# Patient Record
Sex: Female | Born: 1935 | Race: White | Hispanic: No | Smoking: Never smoker
Health system: Southern US, Community
[De-identification: ages and names within clinical notes are randomized; demographics above are authoritative.]

## PROBLEM LIST (undated history)

## (undated) DIAGNOSIS — K219 Gastro-esophageal reflux disease without esophagitis: Secondary | ICD-10-CM

## (undated) DIAGNOSIS — E785 Hyperlipidemia, unspecified: Secondary | ICD-10-CM

## (undated) DIAGNOSIS — M949 Disorder of cartilage, unspecified: Secondary | ICD-10-CM

## (undated) DIAGNOSIS — E039 Hypothyroidism, unspecified: Secondary | ICD-10-CM

## (undated) DIAGNOSIS — J449 Chronic obstructive pulmonary disease, unspecified: Secondary | ICD-10-CM

## (undated) DIAGNOSIS — I10 Essential (primary) hypertension: Secondary | ICD-10-CM

## (undated) DIAGNOSIS — F0390 Unspecified dementia without behavioral disturbance: Secondary | ICD-10-CM

## (undated) DIAGNOSIS — J4489 Other specified chronic obstructive pulmonary disease: Secondary | ICD-10-CM

## (undated) DIAGNOSIS — M899 Disorder of bone, unspecified: Secondary | ICD-10-CM

## (undated) HISTORY — DX: Disorder of bone, unspecified: M89.9

## (undated) HISTORY — DX: Chronic obstructive pulmonary disease, unspecified: J44.9

## (undated) HISTORY — PX: OTHER SURGICAL HISTORY: SHX169

## (undated) HISTORY — DX: Other specified chronic obstructive pulmonary disease: J44.89

## (undated) HISTORY — DX: Hyperlipidemia, unspecified: E78.5

## (undated) HISTORY — DX: Hypothyroidism, unspecified: E03.9

## (undated) HISTORY — DX: Disorder of cartilage, unspecified: M94.9

## (undated) HISTORY — DX: Gastro-esophageal reflux disease without esophagitis: K21.9

## (undated) HISTORY — PX: ABDOMINAL HYSTERECTOMY: SHX81

## (undated) HISTORY — DX: Essential (primary) hypertension: I10

---

## 1998-06-22 ENCOUNTER — Inpatient Hospital Stay (HOSPITAL_COMMUNITY): Admission: EM | Admit: 1998-06-22 | Discharge: 1998-06-25 | Payer: Self-pay | Admitting: Emergency Medicine

## 1998-06-22 ENCOUNTER — Encounter: Payer: Self-pay | Admitting: *Deleted

## 1998-06-24 ENCOUNTER — Encounter: Payer: Self-pay | Admitting: Internal Medicine

## 1999-01-30 ENCOUNTER — Other Ambulatory Visit: Admission: RE | Admit: 1999-01-30 | Discharge: 1999-01-30 | Payer: Self-pay | Admitting: Orthopedic Surgery

## 1999-08-06 ENCOUNTER — Encounter: Admission: RE | Admit: 1999-08-06 | Discharge: 1999-08-06 | Payer: Self-pay | Admitting: Internal Medicine

## 1999-08-06 ENCOUNTER — Encounter: Payer: Self-pay | Admitting: Internal Medicine

## 2000-04-20 ENCOUNTER — Emergency Department (HOSPITAL_COMMUNITY): Admission: EM | Admit: 2000-04-20 | Discharge: 2000-04-20 | Payer: Self-pay | Admitting: *Deleted

## 2000-08-11 ENCOUNTER — Encounter: Payer: Self-pay | Admitting: Internal Medicine

## 2000-08-11 ENCOUNTER — Encounter: Admission: RE | Admit: 2000-08-11 | Discharge: 2000-08-11 | Payer: Self-pay | Admitting: Internal Medicine

## 2001-09-02 ENCOUNTER — Encounter: Payer: Self-pay | Admitting: Internal Medicine

## 2001-09-02 ENCOUNTER — Encounter: Admission: RE | Admit: 2001-09-02 | Discharge: 2001-09-02 | Payer: Self-pay | Admitting: Internal Medicine

## 2002-09-08 ENCOUNTER — Encounter: Payer: Self-pay | Admitting: Internal Medicine

## 2002-09-08 ENCOUNTER — Encounter: Admission: RE | Admit: 2002-09-08 | Discharge: 2002-09-08 | Payer: Self-pay | Admitting: Internal Medicine

## 2003-09-29 ENCOUNTER — Ambulatory Visit (HOSPITAL_COMMUNITY): Admission: RE | Admit: 2003-09-29 | Discharge: 2003-09-29 | Payer: Self-pay | Admitting: Internal Medicine

## 2004-01-22 ENCOUNTER — Emergency Department (HOSPITAL_COMMUNITY): Admission: EM | Admit: 2004-01-22 | Discharge: 2004-01-22 | Payer: Self-pay | Admitting: Emergency Medicine

## 2004-02-27 ENCOUNTER — Emergency Department (HOSPITAL_COMMUNITY): Admission: EM | Admit: 2004-02-27 | Discharge: 2004-02-27 | Payer: Self-pay | Admitting: Emergency Medicine

## 2004-03-06 ENCOUNTER — Ambulatory Visit: Payer: Self-pay | Admitting: Internal Medicine

## 2005-04-22 ENCOUNTER — Ambulatory Visit: Payer: Self-pay | Admitting: Internal Medicine

## 2005-04-24 ENCOUNTER — Ambulatory Visit: Payer: Self-pay | Admitting: Internal Medicine

## 2005-05-01 ENCOUNTER — Ambulatory Visit: Payer: Self-pay

## 2005-08-06 ENCOUNTER — Ambulatory Visit: Payer: Self-pay | Admitting: Internal Medicine

## 2006-03-25 ENCOUNTER — Ambulatory Visit: Payer: Self-pay | Admitting: Internal Medicine

## 2006-09-18 ENCOUNTER — Other Ambulatory Visit: Payer: Self-pay | Admitting: Emergency Medicine

## 2006-09-18 ENCOUNTER — Emergency Department (HOSPITAL_COMMUNITY): Admission: EM | Admit: 2006-09-18 | Discharge: 2006-09-18 | Payer: Self-pay | Admitting: Emergency Medicine

## 2006-09-25 ENCOUNTER — Ambulatory Visit: Payer: Self-pay | Admitting: Internal Medicine

## 2006-10-25 ENCOUNTER — Encounter: Payer: Self-pay | Admitting: *Deleted

## 2006-10-25 DIAGNOSIS — M949 Disorder of cartilage, unspecified: Secondary | ICD-10-CM

## 2006-10-25 DIAGNOSIS — K219 Gastro-esophageal reflux disease without esophagitis: Secondary | ICD-10-CM

## 2006-10-25 DIAGNOSIS — I10 Essential (primary) hypertension: Secondary | ICD-10-CM

## 2006-10-25 DIAGNOSIS — M899 Disorder of bone, unspecified: Secondary | ICD-10-CM | POA: Insufficient documentation

## 2006-10-25 DIAGNOSIS — Z9849 Cataract extraction status, unspecified eye: Secondary | ICD-10-CM

## 2006-10-25 DIAGNOSIS — E039 Hypothyroidism, unspecified: Secondary | ICD-10-CM

## 2006-10-25 DIAGNOSIS — E785 Hyperlipidemia, unspecified: Secondary | ICD-10-CM | POA: Insufficient documentation

## 2006-10-25 DIAGNOSIS — J449 Chronic obstructive pulmonary disease, unspecified: Secondary | ICD-10-CM | POA: Insufficient documentation

## 2006-10-25 DIAGNOSIS — Z9079 Acquired absence of other genital organ(s): Secondary | ICD-10-CM | POA: Insufficient documentation

## 2006-11-06 ENCOUNTER — Inpatient Hospital Stay (HOSPITAL_COMMUNITY): Admission: EM | Admit: 2006-11-06 | Discharge: 2006-11-07 | Payer: Self-pay | Admitting: Emergency Medicine

## 2006-11-07 ENCOUNTER — Encounter: Payer: Self-pay | Admitting: Internal Medicine

## 2006-11-10 ENCOUNTER — Ambulatory Visit: Payer: Self-pay | Admitting: Internal Medicine

## 2006-11-27 ENCOUNTER — Encounter: Payer: Self-pay | Admitting: Internal Medicine

## 2007-02-04 ENCOUNTER — Encounter: Payer: Self-pay | Admitting: Internal Medicine

## 2007-02-04 ENCOUNTER — Encounter (INDEPENDENT_AMBULATORY_CARE_PROVIDER_SITE_OTHER): Payer: Self-pay | Admitting: General Surgery

## 2007-02-04 ENCOUNTER — Ambulatory Visit (HOSPITAL_COMMUNITY): Admission: RE | Admit: 2007-02-04 | Discharge: 2007-02-05 | Payer: Self-pay | Admitting: General Surgery

## 2007-02-19 ENCOUNTER — Encounter: Payer: Self-pay | Admitting: Internal Medicine

## 2008-01-04 ENCOUNTER — Telehealth: Payer: Self-pay | Admitting: Internal Medicine

## 2008-01-07 ENCOUNTER — Ambulatory Visit: Payer: Self-pay | Admitting: Internal Medicine

## 2008-01-07 LAB — CONVERTED CEMR LAB
BUN: 14 mg/dL (ref 6–23)
Basophils Relative: 0.3 % (ref 0.0–3.0)
Bilirubin Urine: NEGATIVE
Calcium: 9.2 mg/dL (ref 8.4–10.5)
Cholesterol: 241 mg/dL (ref 0–200)
Creatinine, Ser: 0.8 mg/dL (ref 0.4–1.2)
Direct LDL: 152 mg/dL
Eosinophils Absolute: 0.2 10*3/uL (ref 0.0–0.7)
GFR calc Af Amer: 87 mL/min
HCT: 36.8 % (ref 36.0–46.0)
Hemoglobin: 12.7 g/dL (ref 12.0–15.0)
MCHC: 34.6 g/dL (ref 30.0–36.0)
Nitrite: NEGATIVE
Specific Gravity, Urine: 1.01 (ref 1.000–1.03)
TSH: 2 microintl units/mL (ref 0.35–5.50)
Total CHOL/HDL Ratio: 4
VLDL: 24 mg/dL (ref 0–40)
pH: 7.5 (ref 5.0–8.0)

## 2008-04-20 ENCOUNTER — Telehealth: Payer: Self-pay | Admitting: Internal Medicine

## 2009-01-13 ENCOUNTER — Telehealth: Payer: Self-pay | Admitting: Internal Medicine

## 2009-03-06 ENCOUNTER — Ambulatory Visit: Payer: Self-pay | Admitting: Internal Medicine

## 2009-03-06 LAB — CONVERTED CEMR LAB
Alkaline Phosphatase: 80 units/L (ref 39–117)
BUN: 16 mg/dL (ref 6–23)
Basophils Absolute: 0.1 10*3/uL (ref 0.0–0.1)
Bilirubin, Direct: 0.2 mg/dL (ref 0.0–0.3)
Chloride: 105 meq/L (ref 96–112)
Creatinine, Ser: 0.9 mg/dL (ref 0.4–1.2)
Eosinophils Absolute: 0.1 10*3/uL (ref 0.0–0.7)
Glucose, Bld: 103 mg/dL — ABNORMAL HIGH (ref 70–99)
HCT: 37.5 % (ref 36.0–46.0)
Hemoglobin: 12.7 g/dL (ref 12.0–15.0)
Lymphs Abs: 1.4 10*3/uL (ref 0.7–4.0)
MCHC: 34 g/dL (ref 30.0–36.0)
MCV: 92.8 fL (ref 78.0–100.0)
Monocytes Absolute: 0.7 10*3/uL (ref 0.1–1.0)
Monocytes Relative: 9.2 % (ref 3.0–12.0)
Neutro Abs: 5.2 10*3/uL (ref 1.4–7.7)
Potassium: 4.1 meq/L (ref 3.5–5.1)
RDW: 13 % (ref 11.5–14.6)
Total Bilirubin: 0.5 mg/dL (ref 0.3–1.2)
Total CHOL/HDL Ratio: 3
VLDL: 19.8 mg/dL (ref 0.0–40.0)

## 2009-07-10 ENCOUNTER — Telehealth: Payer: Self-pay | Admitting: Internal Medicine

## 2010-03-01 NOTE — Progress Notes (Signed)
Summary: arm bruise  Phone Note Call from Patient   Caller: Daughter- Josephina Gip [phone] Summary of Call: Daughter called stating that pt has hurt her harm and " has a bad bruise w/ a scrap". Per daughter, [facility] nurse at the church advised that she should have her MD to take a look at it. Daughter is requesting a call back to explain and see if there is anything that can be done (wrapping etc) w/o coming into the office...Marland KitchenMarland KitchenAlvy Beal Archie CMA  July 10, 2009 2:00 PM   Follow-up for Phone Call        1) wash gently with soap and water 2) after drying well apply thin coat of triple antibiotic ointment 3) cover scrap with non-adherent pad and hold in place with either paper tape or kerlex.  4) call if it looks infected.  Called Ms. Horney with instructions Follow-up by: Jacques Navy MD,  July 10, 2009 6:46 PM

## 2010-03-01 NOTE — Assessment & Plan Note (Signed)
Summary: 2 mos f/u per flag/#/cd   Vital Signs:  Patient profile:   77 year old female Height:      62 inches Weight:      117 pounds O2 Sat:      98 % on Room air Temp:     98.2 degrees F oral Pulse rate:   81 / minute BP sitting:   142 / 70  (left arm) Cuff size:   regular  Vitals Entered By: Bill Salinas CMA (March 06, 2009 10:46 AM)  O2 Flow:  Room air CC: pt here for office visit for refills on medication/ pt is due for tetanus and has never had shingles vaccine/ ab   Primary Care Provider:  Jacques Navy MD  CC:  pt here for office visit for refills on medication/ pt is due for tetanus and has never had shingles vaccine/ ab.  History of Present Illness: Patient presents for routine medical follow-up. Last office visit December '09. In the interval - she has had no major illnesses. She did fall and tore the left rotator cuff - no surgical intervention. She did have PT and did well. She does do relatively well. She did fall out of  bed but no injury. She has had some mild confusion. Before Christmas she did develop cough - treated with otc remedies. She has otherwise been doing OK. She has very close family support.   Current Medications (verified): 1)  Synthroid 25 Mcg  Tabs (Levothyroxine Sodium) .... Take One Tablet Once Daily 2)  Enalapril Maleate 2.5 Mg  Tabs (Enalapril Maleate) .... Take One Tablet Once Daily 3)  Aspirin 81 Mg  Tabs (Aspirin) .... Take One Tablet Once Daily 4)  Advil 200 Mg Tabs (Ibuprofen) .... As Needed 5)  Benadryl 25 Mg Caps (Diphenhydramine Hcl) .... As Needed  Allergies (verified): No Known Drug Allergies  Past History:  Past Medical History: Last updated: 10/25/2006 HYPOTHYROIDISM (ICD-244.9) OSTEOPENIA (ICD-733.90) CHRONIC OBSTRUCTIVE PULMONARY DISEASE (ICD-496) HYPERTENSION (ICD-401.9) GASTROESOPHAGEAL REFLUX DISEASE (ICD-530.81) HYPERLIPIDEMIA (ICD-272.4)    Past Surgical History: Last updated: 10/25/2006 CATARACT  EXTRACTIONS, BILATERAL, HX OF (ICD-V45.61) TOTAL HYSTERECTOMY AND BILATERAL SALPINGOOPHERECTOMY, HX OF (ICD-V45.77)  Family History: Last updated: 01/07/2008 non-contributory in an octogenarian  Social History: HSG married 1944-62 yrs/widowed 1 daughter - '50; 1 son '49; 4 grandchildren 1 step-grandchild, 3 great grands 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  End of Life - patient, in the presence of her daughter, states she does not want CPR, mechanical ventilation or heroic/futile measures.  Review of Systems  The patient denies anorexia, fever, weight loss, weight gain, decreased hearing, hoarseness, chest pain, dyspnea on exertion, peripheral edema, prolonged cough, hemoptysis, abdominal pain, severe indigestion/heartburn, incontinence, suspicious skin lesions, difficulty walking, unusual weight change, and enlarged lymph nodes.    Physical Exam  General:  Elderly, wizened white female in no distress Head:  normocephalic and atraumatic.   Eyes:  vision grossly intact, pupils equal, pupils round, corneas and lenses clear, and no injection.   Ears:  R ear normal and L ear normal.   Nose:  no external deformity and no external erythema.   Neck:  supple, full ROM, no thyromegaly, and no carotid bruits.   Lungs:  normal respiratory effort, normal breath sounds, no crackles, and no wheezes.   Heart:  normal rate, regular rhythm, and no murmur.   Abdomen:  soft, non-tender, normal bowel sounds, no masses, no guarding, and no hepatomegaly.   Msk:  normal  ROM, no joint tenderness, no joint swelling, and no redness over joints.   Pulses:  2+ radial pulses Extremities:  no deformity. Neurologic:  alert & oriented X3, cranial nerves II-XII intact, strength normal in all extremities, and gait normal.   Skin:  turgor normal, color normal, no rashes, no petechiae, and no edema.   Cervical Nodes:  no anterior cervical adenopathy and no posterior cervical adenopathy.     Psych:  Oriented X3, memory intact for recent and remote, normally interactive, good eye contact, and not anxious appearing.     Impression & Recommendations:  Problem # 1:  HYPOTHYROIDISM (ICD-244.9) For lab today with recommendations to follow.   Her updated medication list for this problem includes:    Synthroid 25 Mcg Tabs (Levothyroxine sodium) .Marland Kitchen... Take one tablet once daily  Orders: TLB-TSH (Thyroid Stimulating Hormone) (84443-TSH)  addendum- TSH 2.01 - excellent. Wil lcontinue present medication  Problem # 2:  HYPERTENSION (ICD-401.9)  Her updated medication list for this problem includes:    Enalapril Maleate 2.5 Mg Tabs (Enalapril maleate) .Marland Kitchen... Take one tablet once daily  Orders: TLB-BMP (Basic Metabolic Panel-BMET) (80048-METABOL)  BP today: 142/70 Prior BP: 140/82 (01/07/2008)  Adequate control. Continue present medication.  Labs Reviewed: K+: 4.0 (01/07/2008) Creat: : 0.8 (01/07/2008)   Chol: 241 (01/07/2008)   HDL: 60.3 (01/07/2008)   LDL: DEL (01/07/2008)   TG: 120 (01/07/2008)  Problem # 3:  CHRONIC OBSTRUCTIVE PULMONARY DISEASE (ICD-496) No respiratory distress reported or obsereved. She is stable in this regard.   Problem # 4:  HYPERLIPIDEMIA (ICD-272.4) Due for lab with recommendations to follow.  Orders: TLB-Lipid Panel (80061-LIPID) TLB-Hepatic/Liver Function Pnl (80076-HEPATIC)  Addendum - LDL 164.7 above goal of 130 anbd treatment threshhold of 160. HOwever, given her age, relative absence of other cardiac risk factors will not start medical therapy.  Problem # 5:  Preventive Health Care (ICD-V70.0) Unreamkrable exam. Her lab results, except for mild elevation in cholesterol, are fine. She seems to be doing well. She is current with pneumovax.  In summary - a delightful woman who seems medically stable at this time.  Complete Medication List: 1)  Synthroid 25 Mcg Tabs (Levothyroxine sodium) .... Take one tablet once daily 2)  Enalapril  Maleate 2.5 Mg Tabs (Enalapril maleate) .... Take one tablet once daily 3)  Aspirin 81 Mg Tabs (Aspirin) .... Take one tablet once daily 4)  Advil 200 Mg Tabs (Ibuprofen) .... As needed 5)  Benadryl 25 Mg Caps (Diphenhydramine hcl) .... As needed  Other Orders: TLB-CBC Platelet - w/Differential (85025-CBCD) Prescription Created Electronically [phone])  Patient: [name] Note: All result statuses are Final unless otherwise noted.  Tests: (1) TSH (TSH)   FastTSH                   2.01 uIU/mL                 0.35-5.50  Tests: (2) BMP (METABOL)   Sodium                    141 mEq/L                   135-145   Potassium                 4.1 mEq/L                   3.5-5.1   Chloride  105 mEq/L                   96-112   Carbon Dioxide            31 mEq/L                    19-32   Glucose              [H]  103 mg/dL                   60-45   BUN                       16 mg/dL                    4-09   Creatinine                0.9 mg/dL                   8.1-1.9   Calcium                   9.4 mg/dL                   1.4-78.2   GFR                       62.83 mL/min                >60  Tests: (3) Lipid Panel (LIPID)   Cholesterol          [H]  241 mg/dL                   9-562     ATP III Classification            Desirable:  < 200 mg/dL                    Borderline High:  200 - 239 mg/dL               High:  > = 240 mg/dL   Triglycerides             99.0 mg/dL                  1.3-086.5     Normal:  <150 mg/dL     Borderline High:  784 - 199 mg/dL   HDL                       69.62 mg/dL                 >95.28   VLDL Cholesterol          19.8 mg/dL                  4.1-32.4  CHO/HDL Ratio:  CHD Risk                             3                    Men          Women     1/2 Average Risk     3.4          3.3  Average Risk          5.0          4.4     2X Average Risk          9.6          7.1     3X Average Risk          15.0          11.0                            Tests: (4) Hepatic/Liver Function Panel (HEPATIC)   Total Bilirubin           0.5 mg/dL                   0.4-5.4   Direct Bilirubin          0.2 mg/dL                   0.9-8.1   Alkaline Phosphatase      80 U/L                      39-117   AST                       19 U/L                      0-37   ALT                       12 U/L                      0-35   Total Protein             6.6 g/dL                    1.9-1.4   Albumin                   4.2 g/dL                    7.8-2.9  Tests: (5) CBC Platelet w/Diff (CBCD)   White Cell Count          7.5 K/uL                    4.5-10.5   Red Cell Count            4.04 Mil/uL                 3.87-5.11   Hemoglobin                12.7 g/dL                   56.2-13.0   Hematocrit                37.5 %                      36.0-46.0   MCV                       92.8 fl                     78.0-100.0   MCHC  34.0 g/dL                   16.1-09.6   RDW                       13.0 %                      11.5-14.6   Platelet Count            221.0 K/uL                  150.0-400.0   Neutrophil %              69.3 %                      43.0-77.0   Lymphocyte %              19.0 %                      12.0-46.0   Monocyte %                9.2 %                       3.0-12.0   Eosinophils%              1.0 %                       0.0-5.0   Basophils %               1.5 %                       0.0-3.0   Neutrophill Absolute      5.2 K/uL                    1.4-7.7   Lymphocyte Absolute       1.4 K/uL                    0.7-4.0   Monocyte Absolute         0.7 K/uL                    0.1-1.0  Eosinophils, Absolute                             0.1 K/uL                    0.0-0.7   Basophils Absolute        0.1 K/uL                    0.0-0.1  Tests: (6) Cholesterol LDL - Direct (DIRLDL)  Cholesterol LDL - Direct                             164.4 mg/dL     Optimal:  <045 mg/dL     Near or Above Optimal:  100-129  mg/dL     Borderline High:  409-811 mg/dL     High:  914-782 mg/dL     Very High:  >956 mg/dLPrescriptions: ENALAPRIL MALEATE 2.5 MG  TABS (ENALAPRIL MALEATE) Take one tablet once daily  #  30 x 12   Entered and Authorized by:   Jacques Navy MD   Signed by:   Jacques Navy MD on 03/06/2009   Method used:   Electronically to        [facility] [phone]* (retail)       [address]       [place], [place]  [address]       Ph: [phone]       Fax: [phone]   RxID:   [id number] SYNTHROID 25 MCG  TABS (LEVOTHYROXINE SODIUM) Take one tablet once daily  #30 x 12   Entered and Authorized by:   Jacques Navy MD   Signed by:   Jacques Navy MD on 03/06/2009   Method used:   Electronically to        [facility] [facility] [address] [phone]* (retail)       [address]       [place], [place]  [address]       Ph: [phone]       Fax: [phone]   RxID:   [id number]    Not Administered:    Influenza Vaccine not given due to: declined

## 2010-04-10 ENCOUNTER — Telehealth: Payer: Self-pay | Admitting: Internal Medicine

## 2010-04-17 NOTE — Progress Notes (Signed)
  Phone Note Refill Request      Prescriptions: ENALAPRIL MALEATE 2.5 MG  TABS (ENALAPRIL MALEATE) Take one tablet once daily  #30 x 6   Entered by:   Ami Bullins CMA   Authorized by:   Jacques Navy MD   Signed by:   Bill Salinas CMA on 04/10/2010   Method used:   Electronically to        [facility] [id number] ([facility]ail)       [address]       [place], [place]  [address]       Ph: [phone]       Fax: [phone]   RxID:   [id number] SYNTHROID 25 MCG  TABS (LEVOTHYROXINE SODIUM) Take one tablet once daily  #30 x 6   Entered by:   Ami Bullins CMA   Authorized by:   Jacques Navy MD   Signed by:   Bill Salinas CMA on 04/10/2010   Method used:   [facility]ronically to        [facility] [address] [id number] ([facility]       [address]       [place], [place]  [address]       Ph: [phone]       Fax: [phone]   RxID:   [id number]

## 2010-06-12 NOTE — Discharge Summary (Signed)
[name], [name]           ACCOUNT NO.:  [id number]   MEDICAL RECORD NO.:  [id number]          PATIENT TYPE:  INP   LOCATION:  4714                         FACILITY:  [facility]   PHYSICIAN:  Wilhemina Bonito. Deanna Goodell, MD      DATE OF BIRTH:  [birth year]   DATE OF ADMISSION:  11/06/2006  DATE OF DISCHARGE:  11/07/2006                               DISCHARGE SUMMARY   ADMITTING DIAGNOSES:  1. Hematochezia, suspect lower gastrointestinal bleed possibly      diverticular in source or from a source of known anorectal polyps.  2. Mild anemia.  3. Recent daily use of Advil.  4. The patient less likely has a gastrointestinal bleed secondary to      an upper gastrointestinal source.  5. Hypertension.  6. Osteoarthritis.  7. History of adenomatous colon polyps.  8. Diverticulosis.  9. Hypothyroidism.   DISCHARGE DIAGNOSES:  1. Gastrointestinal bleed from the rectum due to a large friable      sessile polyp in the rectum.  Biopsies from a polyp are pending.  2. Cecal colon polyp retrieved without cautery and sent to pathology.  3. Marked descending to sigmoid diverticulosis.  4. Anemia; mild decline in hemoglobin/hematocrit.  The patient did not      require transfusion.  5. Hyperglycemia, rule out adult onset diabetes mellitus versus      glucose intolerance.   BRIEF HISTORY:  The patient is a very pleasant and active healthy 77-  year-old white female.  She had a screening colonoscopy in December 2004  showing diverticulosis, a small ascending colon polyp and carpeted  rectal polyps.  The carpeted polyps as well as the ascending colon  polyps both represented adenomata without any high-grade dysplasia or  malignancy.  Dr. Juanda Chance entertained the idea of YAG laser ablation of  the rectal polyps because these were at the anal verge, but this was  never performed.  The patient has not been having any trouble with her  bowels other than periodic constipation.  She denies prior episodes of  rectal  bleeding.   On the evening just prior to admission the patient developed painless  hematochezia.  She probably had about five or six episodes before she  got a bit dizzy; and, using her [employer] access was able to contact her  family and was brought over to the emergency room for evaluation.   In the emergency room the patient was stable.  Her hemoglobin was 11,  her coags were normal and her BUN was normal.  On exam she had a  palpable abnormality in the rectum and there was deep maroon, obviously  bloody, material on the exam glove.  She was admitted by Dr. Marina Roberson for  colonoscopy and serial blood counts.  Because she was so stable in the  emergency room she was admitted just to the level of a stepdown bed.   OPERATION PERFORMED:  Colonoscopy by Dr. Yancey Flemings on November 07, 2006.  This again showed a 4-mm polyp at the cecum which was retrieved and sent  to pathology.  There was marked diverticulosis in the descending and  sigmoid  colon.  At the rectum at the anal verge there was a sessile  friable polypoid lesion, which measured about 1.5 x 8 cm and involved  about 40% of the circumference of the lumen.  This was quite friable and  bled during the scope and was felt to be the source for the bleeding.  She had no bleeding in any other parts of her colon.   LABORATORY DATA:  Initially sodium was 138, potassium 4.3,  BUN 10,  creatinine 0.9, and glucose 116.  On recheck her glucose was 135.  Hemoglobin initially was 11, it went to 9.4 and then up to 10.1 on the  morning of November 07, 2006.  Hematocrit was 32.7 at admission and 30.5  the following day.  Platelet count 271,000.  White blood cell count 8.2.  MCV 90.9.  PT 13.3 and INR 1.0.  LFTs were within normal limits.   CONSULTATIONS:  None.   HOSPITAL COURSE:  1. Hematochezia.  The patient underwent colonoscopy prep with      MiraLax/Gatorade.  She tolerated the prep well.  The following      morning she still was passing some  reddish tinged watery material      so she received a couple of enemas to completely clean her out.      The prep was good for the colonoscopy.  The source for the bleeding      was determined to be the carpeted polyps in the rectum.  From  the      standpoint of her bleeding she was stable.  Her blood pressure      never bottomed out and her pulse was not tachycardia.  Plans at      this point are for her to follow up with Dr. Johna Sheriff in his office      on November 27, 2006 for evaluation for possible transanal      resection.   1. Mild anemia without hemodynamic or clinical consequence. She was      supported during admission with judicious IV fluids at 75 mL an      hour.   1. Mild hyperglycemia.  Of note was that her serum blood glucose was      116 in the emergency room and 135 the next day.  This should be      followed up as an outpatient by Dr. Debby Bud as he sees fit.  We did      not assay a hemoglobin A-1-C.  The patient had no symptoms      referable to diabetes such as polyuria or polydipsia.  Her weight      has been stable.   1. Osteoarthritis.  The patient has been using Advil 200 mg about 3      pills a day per her admission along with low dose aspirin daily.      She can continue to use Advil as needed; however, if Tylenol is      effective she should probably try using Tylenol instead of the      Advil given her current situation with recent hematochezia and the      rectal polyps.   DISARGE MEDICATIONS:  The medications at discharge are:  1. Enalapril/Vasotec 2.5 mg daily.  2. Levothyroxine/Synthroid 25 mcg daily.  3. Aspirin 81 mg daily.  4. Advil 200 mg as needed.  5. Tylenol 325 mg 1-2 by mouth every 6 hours as needed.   CONDITION:  Condition at the time  of discharge is stable.   PLAN:  The plan is to discharge her home today after recovering from  colonoscopy with plans for follow up with Dr. Johna Sheriff later this month.      Jennye Moccasin,  PA-C      Wilhemina Bonito. Deanna Goodell, MD  Electronically Signed    SG/MEDQ  D:  11/07/2006  T:  11/08/2006  Job:  [id number]   cc:   Lorne Skeens. Hoxworth, M.D.  Hedwig Morton. Juanda Chance, MD  Rosalyn Gess [name], MD

## 2010-06-12 NOTE — Op Note (Signed)
[name], APT           ACCOUNT NO.:  [id number]   MEDICAL RECORD NO.:  [id number]          PATIENT TYPE:  OIB   LOCATION:  2550                         FACILITY:  [facility]   PHYSICIAN:  Sharlet Salina T. Hoxworth, M.D.DATE OF BIRTH:  1919/04/05   DATE OF PROCEDURE:  02/04/2007  DATE OF DISCHARGE:                               OPERATIVE REPORT   PRE AND POSTOPERATIVE DIAGNOSIS:  Villous adenoma of the rectum.   SURGICAL PROCEDURES:  Is transanal excision of villous adenoma of the  rectum.   SURGEON:  Lorne Skeens. Hoxworth, M.D.   ANESTHESIA:  General.   BRIEF HISTORY:  [name] is an 77 year old female who in October  presented with acute lower GI bleed with bright red blood.  She  underwent colonoscopy with findings of a sessile mass just inside the  anal verge posteriorly too large for colonoscopic excision.  Examination  has revealed a cauliflower-like sessile mass about 3 x 3 cm posteriorly  into the left side above the dentate line.  I have recommend proceeding  with transanal excision.  The nature of procedure, indications, risks of  bleeding, infection, recurrence, anesthetic risks were discussed with  the patient and family preoperatively.  She is now brought to the  operating room for this procedure.   DESCRIPTION OF OPERATION:  Following a rectal prep at home the patient  brought to operating room placed supine position on the operating table  and general endotracheal anesthesia was induced.  She was then carefully  positioned and padded prone jack-knife position, buttocks taped and  perineum sterilely prepped and draped.  Correct patient and procedure  were verified.  The anus was carefully dilated to three fingers.  Retractor was placed.  Findings were just as described with the inferior  border of the polyp being about 0.5 cm from the dentate line distally  and the polyp measuring about 3 x 3 cm.  Initially stay sutures were  placed at the 3, 9 and 12 o'clock  position away from the tumor.  The  tumor was then grasped with Allis clamps and elevated and initially  cautery was used to incise the mucosa in all directions around the polyp  was clearly grossly negative margins.  Following this the polyp was  excised into the deep submucosa with cautery and completely removed.  Hemostasis obtained with cautery and a couple of bleeding points suture-  ligated with 3-0 chromic and complete hemostasis assured.  The lesion  was mainly transversely oriented and the wound was then closed with  running 3-0 chromic transversely essentially bringing the more proximal  rectal mucosa down to the dentate line.  Again hemostasis assured.  The  soft tissue was infiltrated with Marcaine with epinephrine.  The sponges  counts correct.  The patient taken recovery in good condition.   Operation on [employer] T. Hoxworth, M.D.  Electronically Signed    BTH/MEDQ  D:  02/04/2007  T:  02/04/2007  Job:  [id number]

## 2010-06-12 NOTE — H&P (Signed)
[name], [name]           ACCOUNT NO.:  [id number]   MEDICAL RECORD NO.:  [id number]          PATIENT TYPE:  INP   LOCATION:  1826                         FACILITY:  [facility]   PHYSICIAN:  Wilhemina Bonito. Marina Goodell, MD      DATE OF BIRTH:  November 13, 1919   DATE OF ADMISSION:  11/06/2006  DATE OF DISCHARGE:                              HISTORY & PHYSICAL   CHIEF COMPLAINT:  Passing large amounts of blood from rectum.   HISTORY OF PRESENT ILLNESS:  [name] is a very pleasant and  healthy 77 year old white female.  She had undergone a screening  colonoscopy by Dr. Lina Sar in December 2004, this study showed mild  sigmoid diverticulosis, a 5 mm ascending colon polyp.  The biopsy on  this was a tubular adenoma.  In the rectum, she found extensive polyps  which were carpeted. She remove these piecemeal. The biopsies on these  showed tubulovillous adenoma but no high-grade dysplasia or malignancy.  Dr. Juanda Chance had entertained the idea of YAG laser ablation but this had  not been performed.  Dr. Regino Schultze note does say that if there was  malignancy that the patient would need colon resection.  Since that time  the patient has been doing well.  She has occasional constipation but  has had no bleeding per rectum.  She does take 600 mg of Advil a day.   Last evening at 11:45 p.m., the patient went to urinate and when she  went to flush the commode she noticed a lot of blood in the commode.  She was not conscious of having passed anything from her rectum.  This  scenario repeated itself about 5-6 times up until 6 o'clock this morning  when she used her Life Line device and contacted her family and was  brought to the emergency room.  The patient does note that she did begin  to feel weak and was dizzy just before calling Life Line but she had no  loss of consciousness and did not have any falls.  The patient denies  abdominal pain or nausea and vomiting.  She also denies having had  previous  blood per rectum even in small quantities.   ALLERGIES:  None.   MEDICATIONS:  1. Synthroid 25 mcg daily.  2. Enalapril 2.5 mg daily.  3. Advil 600 mg daily.  4. Aspirin 81 mg daily.   PAST MEDICAL HISTORY:  Hypothyroidism, hypotension, osteoarthritis,  colon polyps, diverticulosis.   PAST SURGICAL HISTORY:  Prior surgeries include cataract surgery  bilaterally and a total abdominal hysterectomy.   SOCIAL HISTORY:  The patient has been widowed for about 2 years. She is  active, she still drives and she still gardens. Although age has slowed  her down a bit, she is still quite active.   FAMILY HISTORY:  Mother died at age 92 and had had a stroke.  Father  died around age 31 and had a cancerous tumor of the spine.   REVIEW OF SYSTEMS:  Was dizzy today, no headache, but no blackouts or  loss of consciousness.  No nausea, vomiting or abdominal pain.  The  patient's appetite is a little less and since her husband died she does  not cook the three meals a day she used to, so she does not eat as much  and she has lost about 5-7 pounds over the last 2 years.  She does  suffer from occasional constipation and she relieves this with prune  juice and apple juice. With hard effort such as pulling a garden cart up  a steep incline, she does have shortness of breath and has to stop along  the way as she does this but really she does not have a lot of limits to  housework and other normal daily activities.  She denies chest pain,  palpitations or extremity edema.  She has no urine or stool  incontinence.  She denies any bleeding disorders such as nose or gum  bleeding.  She does have hip and hand stiffness and pain which is why  she uses the Advil.  Her mammograms are up-to-date.   LABORATORY DATA:  Current labs as follows:  Hemoglobin 11, hematocrit  32.7.  White blood cell count 8.2.  Platelets 271,000, MCV 98.9.  PT  13.3, INR 1.0.  Sodium 138, potassium 4.3.  Chloride 107, CO2 26. BUN   10, creatinine 0.9.  Glucose 116.  Her LFTs are within normal limits.   PHYSICAL EXAM:  Blood pressure 124/66, pulse 62, respirations 20,  temperature 96.4, saturation on 2 liters of nasal cannula oxygen 99%.  The patient is a pleasant, elderly white female who looks well.  HEENT:  No pallor, extraocular movements are intact.  Oropharynx, oral  mucosa is moist and clear.  She is wearing dentures on the upper and  lower jaw and these were not removed for exam.  NECK:  There is no JVD, no masses, no thyromegaly.  PULMONARY:  Chest is clear to auscultation and percussion bilaterally.  There is no shortness of breath.  No cough.  CARDIOVASCULAR:  Regular rate and rhythm.  No murmurs, rubs or gallops.  GI:  Abdomen is soft, non obese, nontender, nondistended.  Bowel sounds  are active.  RECTAL:  Notable for maroon stool which does not smell melenic.  There  is palpable irregular mucosa. About 2 inches superior to the anal  opening, she has a small sinus opening which looks benign and is not  draining anything.  Extremities:  No cyanosis, clubbing or edema.  Her feet are warm.  Capillary refill is brisk.  NEUROLOGIC:  She is alert and oriented x3.  She has slight diminished  hearing.  She moves all fours.  Grip and pedal strength are full.  PSYCHIATRIC:  The patient is relaxed and pleasant accompanied by her son  and her daughter.   ASSESSMENT:  1. Gastrointestinal bleed.  Suspect a lower gastrointestinal bleed      very possibly diverticular or coming from the known anorectal      polyps.  One would suspect if it were the polyps however, that she      would have had some prodrome of more minor amounts of bleeding.      Less likely is an upper gastrointestinal source given that her BUN      is normal and she has no upper gastrointestinal complaints.      However, she has been taking Advil at regular doses so we need to      consider an upper GI bleed as well.  2. Mild anemia that does not  require transfusion at the present  time.   PLAN:  1. The patient being admitted for judicious IV fluids. Admit to a      stepdown bed.  2. Will start prophylactic Protonix in case this is an upper GI bleed.  3. Colonoscopy tomorrow with MiraLax/GoLYTELY prep this evening.  4. Serial blood counts and transfuse as needed.  The patient to be      typed and screened for 2 units.  5. Clear liquids.      Jennye Moccasin, PA-C      Wilhemina Bonito. Marina Goodell, MD  Electronically Signed    SG/MEDQ  D:  11/06/2006  T:  11/06/2006  Job:  [id number]

## 2010-07-16 ENCOUNTER — Encounter: Payer: Self-pay | Admitting: Internal Medicine

## 2010-07-17 ENCOUNTER — Ambulatory Visit (INDEPENDENT_AMBULATORY_CARE_PROVIDER_SITE_OTHER): Payer: Medicare Other | Admitting: Internal Medicine

## 2010-07-17 ENCOUNTER — Other Ambulatory Visit (INDEPENDENT_AMBULATORY_CARE_PROVIDER_SITE_OTHER): Payer: Medicare Other

## 2010-07-17 ENCOUNTER — Other Ambulatory Visit: Payer: Self-pay | Admitting: Internal Medicine

## 2010-07-17 DIAGNOSIS — F28 Other psychotic disorder not due to a substance or known physiological condition: Secondary | ICD-10-CM

## 2010-07-17 DIAGNOSIS — R5381 Other malaise: Secondary | ICD-10-CM

## 2010-07-17 DIAGNOSIS — R5383 Other fatigue: Secondary | ICD-10-CM

## 2010-07-17 DIAGNOSIS — Z0000 Encounter for general adult medical examination without abnormal findings: Secondary | ICD-10-CM

## 2010-07-17 DIAGNOSIS — E039 Hypothyroidism, unspecified: Secondary | ICD-10-CM

## 2010-07-17 DIAGNOSIS — I10 Essential (primary) hypertension: Secondary | ICD-10-CM

## 2010-07-17 DIAGNOSIS — E785 Hyperlipidemia, unspecified: Secondary | ICD-10-CM

## 2010-07-17 DIAGNOSIS — K219 Gastro-esophageal reflux disease without esophagitis: Secondary | ICD-10-CM

## 2010-07-17 LAB — CBC WITH DIFFERENTIAL/PLATELET
Basophils Relative: 0.3 % (ref 0.0–3.0)
Eosinophils Absolute: 0.1 10*3/uL (ref 0.0–0.7)
Eosinophils Relative: 1 % (ref 0.0–5.0)
Lymphocytes Relative: 28.7 % (ref 12.0–46.0)
Neutrophils Relative %: 61.5 % (ref 43.0–77.0)
Platelets: 218 10*3/uL (ref 150.0–400.0)
RBC: 3.8 Mil/uL — ABNORMAL LOW (ref 3.87–5.11)
WBC: 6.7 10*3/uL (ref 4.5–10.5)

## 2010-07-17 MED ORDER — LEVOTHYROXINE SODIUM 25 MCG PO TABS: 25.0000 ug | ORAL_TABLET | Freq: Every day | ORAL | Status: DC

## 2010-07-17 MED ORDER — ENALAPRIL MALEATE 2.5 MG PO TABS: 2.5000 mg | ORAL_TABLET | Freq: Every day | ORAL | Status: DC

## 2010-07-17 NOTE — Progress Notes (Signed)
Subjective:    Patient ID: [name], female    DOB: 10/11/1919, 77 y.o.   MRN: [id number]  HPI The patient is here for annual Medicare wellness examination and management of other chronic and acute problems. She is also for follow-up of hypertension, thyroid disease and her chief complaint is that she is low on energy.    The risk factors are reflected in the social history.  The roster of all physicians providing medical care to patient - is listed in the Snapshot section of the chart.  Activities of daily living:  The patient is 100% inedpendent in all ADLs: dressing, toileting, feeding as well as independent mobility - including driving short distances to store, church.  Home safety : The patient has smoke detectors in the home. They wear seatbelts. No firearms at home. There is no violence in the home. In the interval she has had a fall and sustained a bad hematoma to the left hand and wrist.   There is no risks for hepatitis, STDs or HIV. There is no   history of blood transfusion. They have no travel history to infectious disease endemic areas of the world.  The patient has not seen their dentist in the last six month. They have not seen their eye doctor in the last year. They admit to hearing difficulty and have not had audiologic testing in the last year.  They do not  have excessive sun exposure. Discussed the need for sun protection: hats, long sleeves and use of sunscreen if there is significant sun exposure.   Diet: the importance of a healthy diet is discussed. They do have a unhealthy-high fat/fast food diet.  The patient has no regular exercise program, but she does do a fair amount of walking.  The benefits of regular aerobic exercise were discussed.  Depression screen: there are no signs or vegative symptoms of depression- irritability, change in appetite, anhedonia, sadness/tearfullness. She has had intermiitent hallucinations that so far have not been threatening  or to a degree that there is any disruption to her normal routines..   Cognitive assessment: the patient's daughter manages her financial affair. She manages her own personal affairs otherwise and is actively engaged. They could relate day,date,year and events.  The following portions of the patient's history were reviewed and updated as appropriate: allergies, current medications, past family history, past medical history,  past surgical history, past social history  and problem list.  During the course of the visit the patient was educated and counseled about appropriate screening and preventive services including : fall prevention , diabetes screening, nutrition counseling, colorectal cancer screening, and recommended immunizations.  Past Medical History  Diagnosis Date  . Unspecified hypothyroidism   . Disorder of bone and cartilage, unspecified   . Chronic airway obstruction, not elsewhere classified   . Unspecified essential hypertension   . Esophageal reflux   . Other and unspecified hyperlipidemia    Past Surgical History  Procedure Date  . Cataract extractions     bilateral  . Abdominal hysterectomy     total  . Bilateral salpingoopherectomy    No family history on file. History   Social History  . Marital Status: Widowed    Spouse Name: N/A    Number of Children: N/A  . Years of Education: N/A   Occupational History  . Not on file.   Social History Main Topics  . Smoking status: Not on file  . Smokeless tobacco: Not on file  . Alcohol  Use: Not on file  . Drug Use: Not on file  . Sexually Active: Not on file   Other Topics Concern  . Not on file   Social History Narrative   Work: Arts development officer and kept childerenLives- I-ADLs including drivingEnd of Life- pt, in the presence of her daughter, states she doesn't want CPR, mechanical ventilation or heroic/ futile measures      Review of Systems Review of Systems  Constitutional:  Negative for fever, chills,  activity change and unexpected weight change.  HEENT:  Positive for hearing loss. Negative for ear pain, congestion, neck stiffness and postnasal drip. Negative for sore throat or swallowing problems. Negative for dental complaints.   Eyes: Negative for vision loss or change in visual acuity.  Respiratory: Negative for chest tightness and wheezing.   Cardiovascular: Negative for chest pain and palpitation. No decreased exercise tolerance Gastrointestinal: No change in bowel habit. No bloating or gas. No reflux or indigestion Genitourinary: Negative for urgency, frequency, flank pain and difficulty urinating.  Musculoskeletal: Negative for myalgias, back pain, arthralgias and gait problem.  Neurological: Negative for dizziness, tremors, weakness and headaches.  Hematological: Negative for adenopathy.  Psychiatric/Behavioral: Negative for behavioral problems and dysphoric mood.       Objective:   Physical Exam Vitals noted -BP mildly elevated. Gen'l - a spry elderly white woman who is in no distress and is oriented to person, place and context. HEENT - [place]/AT, C&S clear, PERRLA Neck - supple, no thyromegaly Nodes - negative in the cervical region Lungs - no increased WOB, no rales or wheeze, normal respiratory rate Cor - RRR, no murmurs, no carotid bruits. Abdomen soft Extremities - mild interosseous wasting hands, no deformities, no edema Neuro - A&O to person, place and context. Speech is clear. Thought process seems normal. She does admit to non-threatening hallucinosis and denies any sense of fearfulness. CN II-XII grossly normal. MS - able to stand without assist, normal ambulation.  Lab Results  Component Value Date   WBC 6.7 07/17/2010   HGB 11.9* 07/17/2010   HCT 35.1* 07/17/2010   PLT 218.0 07/17/2010   CHOL 239* 07/17/2010   TRIG 103.0 07/17/2010   HDL 63.20 07/17/2010   LDLDIRECT 160.3 07/17/2010   ALT 15 07/17/2010   AST 25 07/17/2010   NA 138 07/17/2010   K 4.3 07/17/2010   CL  103 07/17/2010   CREATININE 0.9 07/17/2010   BUN 17 07/17/2010   CO2 26 07/17/2010   TSH 1.35 07/17/2010          Assessment & Plan:  1. Malaise and fatigue - patient complains of lack of energy to do all her usual activities although she is still very active. Her exam is unremarkable.  Plan - routine lab to rule out any metabolic or underlying cause.  Addendum - all labs are normal.

## 2010-07-18 LAB — LIPID PANEL
Cholesterol: 239 mg/dL — ABNORMAL HIGH (ref 0–200)
HDL: 63.2 mg/dL (ref >39.00)
Total CHOL/HDL Ratio: 4
Triglycerides: 103 mg/dL (ref 0.0–149.0)

## 2010-07-18 LAB — COMPREHENSIVE METABOLIC PANEL
ALT: 15 U/L (ref 0–35)
AST: 25 U/L (ref 0–37)
Albumin: 4.2 g/dL (ref 3.5–5.2)
CO2: 26 mEq/L (ref 19–32)
Calcium: 9.3 mg/dL (ref 8.4–10.5)
Chloride: 103 mEq/L (ref 96–112)
GFR: 64.28 mL/min (ref >60.00)
Potassium: 4.3 mEq/L (ref 3.5–5.1)

## 2010-07-18 LAB — TSH: TSH: 1.35 u[IU]/mL (ref 0.35–5.50)

## 2010-07-19 DIAGNOSIS — F02818 Dementia in other diseases classified elsewhere, unspecified severity, with other behavioral disturbance: Secondary | ICD-10-CM | POA: Insufficient documentation

## 2010-07-19 DIAGNOSIS — F0281 Dementia in other diseases classified elsewhere with behavioral disturbance: Secondary | ICD-10-CM | POA: Insufficient documentation

## 2010-07-19 MED ORDER — ENALAPRIL MALEATE 5 MG PO TABS: 5.0000 mg | ORAL_TABLET | Freq: Every day | ORAL | Status: DC

## 2010-07-19 NOTE — Assessment & Plan Note (Signed)
TSH is in normal range. Exam is normal.  Plan - continue present medications.

## 2010-07-19 NOTE — Assessment & Plan Note (Signed)
Lipid panel with LDL of 160. She has no h/o cardiac disease.   Plan - no medication recommended secondary to age and limited benefit           Diet modification if possible to a lower fat content selection, i.e. Less fast food.

## 2010-07-19 NOTE — Assessment & Plan Note (Signed)
BP Readings from Last 3 Encounters:  07/17/10 168/84  03/06/09 142/70  01/07/08 140/82   Past BPs better controlled. Current level too high  Plan - will increase enalapril to 5 mg daily - may take 2 x 2.5 until current supply used.           If SBP remains greater than 140 will add low dose diuretic.

## 2010-07-19 NOTE — Assessment & Plan Note (Signed)
No complaint at today's visit 

## 2010-07-19 NOTE — Assessment & Plan Note (Signed)
Mrs. Burson seems alert and oriented but does admit to mild hallucinations - seeing people in the house or yard, receiving phone calls which the daughter cannot verify with caller ID. There have been remote instances of intruders but none recently. This does not sound like a problem of anxiety or depression. The patient has does not report feeling threatened or disturbed by this.  Plan - no indication for medication. Discussed with patient and daughter the risks of anti-anxiety medication, especially benzodiazepines with risk of pseudo-dimentia, falls and hip fractures.           Recommended reassurance, frequent contact.           Urged the patient and her daughter to consider an alternative to her living alone,e.g. Congregate living such as assisted living

## 2010-11-02 LAB — DIFFERENTIAL
Basophils Absolute: 0
Lymphocytes Relative: 31
Lymphs Abs: 2.6
Monocytes Absolute: 0.7
Monocytes Relative: 8
Neutro Abs: 4.9

## 2010-11-02 LAB — CBC
HCT: 37.3
MCHC: 33.2
MCV: 89.6
Platelets: 284
RDW: 13.3

## 2010-11-02 LAB — URINALYSIS, ROUTINE W REFLEX MICROSCOPIC
Bilirubin Urine: NEGATIVE
Glucose, UA: NEGATIVE
Hgb urine dipstick: NEGATIVE
Protein, ur: NEGATIVE
Specific Gravity, Urine: 1.01

## 2010-11-02 LAB — URINE MICROSCOPIC-ADD ON

## 2010-11-02 LAB — COMPREHENSIVE METABOLIC PANEL
Albumin: 3.9
BUN: 8
Calcium: 9.6
Creatinine, Ser: 0.9
Total Bilirubin: 0.4
Total Protein: 6.2

## 2010-11-08 LAB — BASIC METABOLIC PANEL
CO2: 24
Calcium: 8.9
Creatinine, Ser: 0.79
GFR calc Af Amer: >60
GFR calc non Af Amer: >60

## 2010-11-08 LAB — COMPREHENSIVE METABOLIC PANEL
ALT: 13
AST: 18
Albumin: 3.3 — ABNORMAL LOW
Alkaline Phosphatase: 64
CO2: 26
Chloride: 107
GFR calc Af Amer: >60
GFR calc non Af Amer: 59 — ABNORMAL LOW
Potassium: 4.3
Sodium: 138
Total Bilirubin: 0.4

## 2010-11-08 LAB — CBC
Platelets: 271
RBC: 3.6 — ABNORMAL LOW
WBC: 8.2

## 2010-11-08 LAB — HEMOGLOBIN AND HEMATOCRIT, BLOOD
HCT: 27.6 — ABNORMAL LOW
HCT: 29.5 — ABNORMAL LOW
Hemoglobin: 10.1 — ABNORMAL LOW
Hemoglobin: 9.4 — ABNORMAL LOW

## 2010-11-08 LAB — DIFFERENTIAL
Basophils Absolute: 0
Basophils Relative: 0
Eosinophils Absolute: 0.1
Eosinophils Relative: 1
Lymphocytes Relative: 25
Monocytes Absolute: 0.6

## 2010-11-08 LAB — TYPE AND SCREEN: Antibody Screen: NEGATIVE

## 2010-11-09 LAB — URINALYSIS, ROUTINE W REFLEX MICROSCOPIC
Bilirubin Urine: NEGATIVE
Glucose, UA: NEGATIVE
Hgb urine dipstick: NEGATIVE
Ketones, ur: NEGATIVE
Nitrite: NEGATIVE
Protein, ur: NEGATIVE
Specific Gravity, Urine: 1.012
Urobilinogen, UA: 0.2
pH: 6.5

## 2010-11-09 LAB — POCT CARDIAC MARKERS
CKMB, poc: 4.5
CKMB, poc: 5.6
Myoglobin, poc: 162
Myoglobin, poc: 239
Operator id: 146091
Operator id: 285841
Troponin i, poc: <0.05
Troponin i, poc: <0.05

## 2010-11-09 LAB — URINE MICROSCOPIC-ADD ON

## 2010-11-09 LAB — CBC
HCT: 39.1
Hemoglobin: 13.3
MCHC: 34
MCV: 89.9
Platelets: 270
RBC: 4.35
RDW: 13.1
WBC: 10.9 — ABNORMAL HIGH

## 2010-11-09 LAB — I-STAT 8, (EC8 V) (CONVERTED LAB)
Acid-Base Excess: 2
BUN: 15
Bicarbonate: 28.9 — ABNORMAL HIGH
Chloride: 103
Glucose, Bld: 105 — ABNORMAL HIGH
HCT: 42
Hemoglobin: 14.3
Operator id: 146091
Potassium: 3.8
Sodium: 136
TCO2: 30
pCO2, Ven: 51.3 — ABNORMAL HIGH
pH, Ven: 7.36 — ABNORMAL HIGH

## 2010-11-09 LAB — POCT I-STAT CREATININE
Creatinine, Ser: 0.9
Operator id: 146091

## 2010-11-09 LAB — DIFFERENTIAL
Basophils Absolute: 0
Basophils Relative: 0
Eosinophils Absolute: 0.1
Eosinophils Relative: 1
Lymphocytes Relative: 15
Lymphs Abs: 1.7
Monocytes Absolute: 0.7
Monocytes Relative: 6
Neutro Abs: 8.5 — ABNORMAL HIGH
Neutrophils Relative %: 78 — ABNORMAL HIGH

## 2010-11-17 ENCOUNTER — Other Ambulatory Visit: Payer: Self-pay | Admitting: Internal Medicine

## 2010-11-20 ENCOUNTER — Other Ambulatory Visit: Payer: Self-pay | Admitting: Internal Medicine

## 2010-12-09 ENCOUNTER — Emergency Department (HOSPITAL_COMMUNITY): Payer: Medicare Other

## 2010-12-09 ENCOUNTER — Emergency Department (HOSPITAL_COMMUNITY)
Admission: EM | Admit: 2010-12-09 | Discharge: 2010-12-10 | Disposition: A | Discharge status: Home / Self Care | Payer: Medicare Other | Attending: Emergency Medicine | Admitting: Emergency Medicine

## 2010-12-09 ENCOUNTER — Telehealth (HOSPITAL_COMMUNITY): Payer: Self-pay | Admitting: Emergency Medicine

## 2010-12-09 ENCOUNTER — Encounter (HOSPITAL_COMMUNITY): Payer: Self-pay

## 2010-12-09 ENCOUNTER — Other Ambulatory Visit: Payer: Self-pay

## 2010-12-09 DIAGNOSIS — F29 Unspecified psychosis not due to a substance or known physiological condition: Secondary | ICD-10-CM | POA: Insufficient documentation

## 2010-12-09 DIAGNOSIS — W19XXXA Unspecified fall, initial encounter: Secondary | ICD-10-CM | POA: Insufficient documentation

## 2010-12-09 DIAGNOSIS — S0101XA Laceration without foreign body of scalp, initial encounter: Secondary | ICD-10-CM

## 2010-12-09 DIAGNOSIS — F0390 Unspecified dementia without behavioral disturbance: Secondary | ICD-10-CM

## 2010-12-09 DIAGNOSIS — S0990XA Unspecified injury of head, initial encounter: Secondary | ICD-10-CM | POA: Insufficient documentation

## 2010-12-09 DIAGNOSIS — Z7982 Long term (current) use of aspirin: Secondary | ICD-10-CM | POA: Insufficient documentation

## 2010-12-09 DIAGNOSIS — R4182 Altered mental status, unspecified: Secondary | ICD-10-CM | POA: Insufficient documentation

## 2010-12-09 DIAGNOSIS — IMO0002 Reserved for concepts with insufficient information to code with codable children: Secondary | ICD-10-CM | POA: Insufficient documentation

## 2010-12-09 DIAGNOSIS — M542 Cervicalgia: Secondary | ICD-10-CM | POA: Insufficient documentation

## 2010-12-09 DIAGNOSIS — I10 Essential (primary) hypertension: Secondary | ICD-10-CM | POA: Insufficient documentation

## 2010-12-09 DIAGNOSIS — E039 Hypothyroidism, unspecified: Secondary | ICD-10-CM | POA: Insufficient documentation

## 2010-12-09 DIAGNOSIS — J449 Chronic obstructive pulmonary disease, unspecified: Secondary | ICD-10-CM | POA: Insufficient documentation

## 2010-12-09 DIAGNOSIS — R1110 Vomiting, unspecified: Secondary | ICD-10-CM | POA: Insufficient documentation

## 2010-12-09 DIAGNOSIS — J4489 Other specified chronic obstructive pulmonary disease: Secondary | ICD-10-CM | POA: Insufficient documentation

## 2010-12-09 DIAGNOSIS — Z79899 Other long term (current) drug therapy: Secondary | ICD-10-CM | POA: Insufficient documentation

## 2010-12-09 DIAGNOSIS — K219 Gastro-esophageal reflux disease without esophagitis: Secondary | ICD-10-CM | POA: Insufficient documentation

## 2010-12-09 DIAGNOSIS — S0100XA Unspecified open wound of scalp, initial encounter: Secondary | ICD-10-CM | POA: Insufficient documentation

## 2010-12-09 DIAGNOSIS — E785 Hyperlipidemia, unspecified: Secondary | ICD-10-CM | POA: Insufficient documentation

## 2010-12-09 DIAGNOSIS — R42 Dizziness and giddiness: Secondary | ICD-10-CM | POA: Insufficient documentation

## 2010-12-09 LAB — CBC
MCH: 30 pg (ref 26.0–34.0)
MCHC: 32.8 g/dL (ref 30.0–36.0)
MCV: 91.5 fL (ref 78.0–100.0)
WBC: 11.6 10*3/uL — ABNORMAL HIGH (ref 4.0–10.5)

## 2010-12-09 LAB — URINALYSIS, ROUTINE W REFLEX MICROSCOPIC
Bilirubin Urine: NEGATIVE
Hgb urine dipstick: NEGATIVE
Specific Gravity, Urine: 1.012 (ref 1.005–1.030)
pH: 6.5 (ref 5.0–8.0)

## 2010-12-09 LAB — COMPREHENSIVE METABOLIC PANEL
ALT: 13 U/L (ref 0–35)
Albumin: 3.6 g/dL (ref 3.5–5.2)
Alkaline Phosphatase: 69 U/L (ref 39–117)
BUN: 13 mg/dL (ref 6–23)
Chloride: 101 mEq/L (ref 96–112)
Potassium: 3.5 mEq/L (ref 3.5–5.1)
Total Bilirubin: 0.8 mg/dL (ref 0.3–1.2)

## 2010-12-09 LAB — DIFFERENTIAL
Lymphs Abs: 1.1 10*3/uL (ref 0.7–4.0)
Monocytes Absolute: 1 10*3/uL (ref 0.1–1.0)
Monocytes Relative: 9 % (ref 3–12)
Neutro Abs: 9.5 10*3/uL — ABNORMAL HIGH (ref 1.7–7.7)
Neutrophils Relative %: 82 % — ABNORMAL HIGH (ref 43–77)

## 2010-12-09 MED ORDER — SODIUM CHLORIDE 0.9 % IV SOLN
INTRAVENOUS | Status: DC
  Administered 2010-12-09 – 2010-12-10 (×3): via INTRAVENOUS

## 2010-12-09 NOTE — ED Provider Notes (Signed)
Pt care in CDU assumed by myself. Pt has been sleeping since her arrival to CDU. Pt awoken to evaluate, denies any needs at this time. Nursing staff is cleaning wound; will apply bacitracin ointment and dressing.  Elwyn Reach [place], [place] 12/09/10 [id number]

## 2010-12-09 NOTE — ED Notes (Signed)
Pt daughter name Josephina Gip [phone] Cell [phone]; Pt son name Christyann Manolis [phone]

## 2010-12-09 NOTE — ED Notes (Signed)
Dressing abpplied to the back of patient head.  Antibiotic, gauze and 4x4 used. Pt tolerated well. Will continue to monitor.

## 2010-12-09 NOTE — ED Notes (Signed)
Report given to Alvino Chapel, RN.  Pt going to CDU

## 2010-12-09 NOTE — ED Notes (Signed)
Pt with controlled abrasion to back of head. No active bleed present. Will continue to monitor.

## 2010-12-09 NOTE — ED Provider Notes (Addendum)
History     CSN: [id number] Arrival date & time: 12/09/2010  2:07 PM   First MD Initiated Contact with Patient 12/09/10 1510      Chief Complaint  Patient presents with  . Head Injury    pt feel Friday morning and then fell again at 0000 this am while at the church, small lac to the back of head    (Consider location/radiation/quality/duration/timing/severity/associated sxs/prior treatment) HPI Patient was brought in by family because of fall last night.  Apparently she got out of her house and went to church around midnight when she thought there was a service at that time.  She apparently became confused and fell and struck her head.  Family member found her today.  She had had some vomiting although has been unwitnessed.  She had a recent fall last week.  And has had history of hallucinations in the past.  Currently she lives alone and does drive.  Family is concerned about her behavior.  They want her evaluated for head injury.  Patient has previous past history as indicated.  There is no known recent fever or other signs of infection. Past Medical History  Diagnosis Date  . Unspecified hypothyroidism   . Disorder of bone and cartilage, unspecified   . Chronic airway obstruction, not elsewhere classified   . Unspecified essential hypertension   . Esophageal reflux   . Other and unspecified hyperlipidemia     Past Surgical History  Procedure Date  . Cataract extractions     bilateral  . Abdominal hysterectomy     total  . Bilateral salpingoopherectomy     History reviewed. No pertinent family history.  History  Substance Use Topics  . Smoking status: Never Smoker   . Smokeless tobacco: Not on file  . Alcohol Use: No    OB History    Grav Para Term Preterm Abortions TAB SAB Ect Mult Living                  Review of Systems  Constitutional: Negative for fever.  HENT: Positive for neck pain.   Eyes: Negative.   Respiratory: Negative.   Cardiovascular:  Negative.   Gastrointestinal: Negative.   Musculoskeletal: Negative for back pain.  Neurological: Positive for dizziness.  Hematological: Negative.   Psychiatric/Behavioral: Positive for confusion.    Allergies  Review of patient's allergies indicates no known allergies.  Home Medications   Current Outpatient Rx  Name Route Sig Dispense Refill  . ASPIRIN 81 MG PO TABS Oral Take 81 mg by mouth daily.      Marland Kitchen DIPHENHYDRAMINE HCL (SLEEP) 25 MG PO TABS Oral Take 25 mg by mouth as needed. For allergies and sleep    . ENALAPRIL MALEATE 2.5 MG PO TABS  TAKE ONE TABLET BY MOUTH ONCE DAILY 30 tablet 6  . IBUPROFEN 200 MG PO TABS Oral Take 400 mg by mouth every 6 (six) hours as needed. For pain    . LEVOTHYROXINE SODIUM 25 MCG PO TABS Oral Take 1 tablet (25 mcg total) by mouth daily. 30 tablet 11  . SUPER HIGH VITAMINS/MINERALS PO TABS Oral Take 1 tablet by mouth daily.      Marland Kitchen BOOST HIGH PROTEIN PO LIQD Oral Take 237 mLs by mouth daily.        BP 141/70  Pulse 95  Temp(Src) 98.6 F (37 C) (Oral)  Resp 18  SpO2 96%  Physical Exam  Nursing note and vitals reviewed. Constitutional: She appears well-developed and well-nourished.  No distress.  HENT:  Head: Normocephalic. Head is with abrasion and with laceration.    Eyes: Conjunctivae are normal. Pupils are equal, round, and reactive to light.  Neck: Normal range of motion.  Cardiovascular: Normal rate and intact distal pulses.   Pulmonary/Chest: Effort normal. No respiratory distress.  Abdominal: Soft. Normal appearance and bowel sounds are normal. She exhibits no distension.  Musculoskeletal: Normal range of motion.  Neurological: She is alert. No cranial nerve deficit.  Skin: Skin is warm and dry. No rash noted.  Psychiatric: She has a normal mood and affect. Her behavior is normal.    ED Course  Procedures (including critical care time)  Labs Reviewed  COMPREHENSIVE METABOLIC PANEL - Abnormal; Notable for the following:     Glucose, Bld 117 (*)    GFR calc non Af Amer 72 (*)    GFR calc Af Amer 83 (*)    All other components within normal limits  CBC - Abnormal; Notable for the following:    WBC 11.6 (*)    RBC 3.63 (*)    Hemoglobin 10.9 (*)    HCT 33.2 (*)    All other components within normal limits  DIFFERENTIAL - Abnormal; Notable for the following:    Neutrophils Relative 82 (*)    Neutro Abs 9.5 (*)    Lymphocytes Relative 9 (*)    All other components within normal limits  PROTIME-INR  URINALYSIS, ROUTINE W REFLEX MICROSCOPIC  URINE CULTURE   Dg Chest 2 View  12/09/2010  *RADIOLOGY REPORT*  Clinical Data: Fall  CHEST - 2 VIEW  Comparison: Plain films 01/28/2007  Findings: Stable mediastinum and heart silhouette with ectatic aorta.  Lungs are hyperinflated.  No effusion, infiltrate, pneumothorax.  Stable apical scarring.  Osteopenia.  IMPRESSION:  1.  No acute cardiopulmonary findings.  2.   Emphysematous change.  Original Report Authenticated By: Genevive Bi, M.D.   Ct Head Wo Contrast  12/09/2010  *RADIOLOGY REPORT*  Clinical Data: 77 year old female with fall.  Head laceration.  CT HEAD WITHOUT CONTRAST  Technique:  Contiguous axial images were obtained from the base of the skull through the vertex without contrast.  Comparison: 09/18/2006.  Findings: Posterior scalp hematoma is broad-based and measures up to 9 mm in thickness.  Underlying calvarium is intact.  Mastoids are clear.  Paranasal sinuses are clear.  Stable orbits. Calcified atherosclerosis at the skull base.  Patchy confluent cerebral white matter hypodensity including chronic lacunar infarction extending into the right basal ganglia has not significantly changed. Stable ventricle size and configuration.  No acute intracranial hemorrhage identified.  No midline shift, mass effect, or evidence of mass lesion.  No suspicious intracranial vascular hyperdensity.  IMPRESSION: 1.  Posterior scalp hematoma without underlying fracture. 2.   Stable chronic small vessel ischemia. No acute intracranial abnormality.  Original Report Authenticated By: Harley Hallmark, M.D.     1. Fall   2. Scalp laceration   3. Dementia       MDM   Plan on admitting to CDU holding with a social services consult in the morning for nursing home/extended living admission.      Nelia Shi, MD 12/09/10 2003  Nelia Shi, MD 12/10/10 [phone]

## 2010-12-09 NOTE — ED Notes (Signed)
PA aware and will assess pt for possible dressing placement.

## 2010-12-10 ENCOUNTER — Ambulatory Visit (INDEPENDENT_AMBULATORY_CARE_PROVIDER_SITE_OTHER): Payer: Medicare Other

## 2010-12-10 ENCOUNTER — Other Ambulatory Visit: Payer: Medicare Other

## 2010-12-10 ENCOUNTER — Telehealth: Payer: Self-pay | Admitting: *Deleted

## 2010-12-10 DIAGNOSIS — R300 Dysuria: Secondary | ICD-10-CM

## 2010-12-10 DIAGNOSIS — Z111 Encounter for screening for respiratory tuberculosis: Secondary | ICD-10-CM

## 2010-12-10 NOTE — ED Provider Notes (Signed)
History     CSN: [id number] Arrival date & time: 12/09/2010  2:07 PM   First MD Initiated Contact with Patient 12/09/10 1510      Chief Complaint  Patient presents with  . Head Injury    pt feel Friday morning and then fell again at 0000 this am while at the church, small lac to the back of head    (Consider location/radiation/quality/duration/timing/severity/associated sxs/prior treatment) HPI  Past Medical History  Diagnosis Date  . Unspecified hypothyroidism   . Disorder of bone and cartilage, unspecified   . Chronic airway obstruction, not elsewhere classified   . Unspecified essential hypertension   . Esophageal reflux   . Other and unspecified hyperlipidemia     Past Surgical History  Procedure Date  . Cataract extractions     bilateral  . Abdominal hysterectomy     total  . Bilateral salpingoopherectomy     History reviewed. No pertinent family history.  History  Substance Use Topics  . Smoking status: Never Smoker   . Smokeless tobacco: Not on file  . Alcohol Use: No    OB History    Grav Para Term Preterm Abortions TAB SAB Ect Mult Living                  Review of Systems  Allergies  Review of patient's allergies indicates no known allergies.  Home Medications   Current Outpatient Rx  Name Route Sig Dispense Refill  . ASPIRIN 81 MG PO TABS Oral Take 81 mg by mouth daily.      Marland Kitchen DIPHENHYDRAMINE HCL (SLEEP) 25 MG PO TABS Oral Take 25 mg by mouth as needed. For allergies and sleep    . ENALAPRIL MALEATE 2.5 MG PO TABS  TAKE ONE TABLET BY MOUTH ONCE DAILY 30 tablet 6  . IBUPROFEN 200 MG PO TABS Oral Take 400 mg by mouth every 6 (six) hours as needed. For pain    . LEVOTHYROXINE SODIUM 25 MCG PO TABS Oral Take 1 tablet (25 mcg total) by mouth daily. 30 tablet 11  . SUPER HIGH VITAMINS/MINERALS PO TABS Oral Take 1 tablet by mouth daily.      Marland Kitchen BOOST HIGH PROTEIN PO LIQD Oral Take 237 mLs by mouth daily.        BP 104/75  Pulse 101  Temp(Src)  98.1 F (36.7 C) (Axillary)  Resp 20  SpO2 95%  Physical Exam  ED Course  Procedures (including critical care time)  Labs Reviewed  COMPREHENSIVE METABOLIC PANEL - Abnormal; Notable for the following:    Glucose, Bld 117 (*)    GFR calc non Af Amer 72 (*)    GFR calc Af Amer 83 (*)    All other components within normal limits  CBC - Abnormal; Notable for the following:    WBC 11.6 (*)    RBC 3.63 (*)    Hemoglobin 10.9 (*)    HCT 33.2 (*)    All other components within normal limits  DIFFERENTIAL - Abnormal; Notable for the following:    Neutrophils Relative 82 (*)    Neutro Abs 9.5 (*)    Lymphocytes Relative 9 (*)    All other components within normal limits  PROTIME-INR  URINALYSIS, ROUTINE W REFLEX MICROSCOPIC  URINE CULTURE   Dg Chest 2 View  12/09/2010  *RADIOLOGY REPORT*  Clinical Data: Fall  CHEST - 2 VIEW  Comparison: Plain films 01/28/2007  Findings: Stable mediastinum and heart silhouette with ectatic aorta.  Lungs  are hyperinflated.  No effusion, infiltrate, pneumothorax.  Stable apical scarring.  Osteopenia.  IMPRESSION:  1.  No acute cardiopulmonary findings.  2.   Emphysematous change.  Original Report Authenticated By: Genevive Bi, M.D.   Ct Head Wo Contrast  12/09/2010  *RADIOLOGY REPORT*  Clinical Data: 77 year old female with fall.  Head laceration.  CT HEAD WITHOUT CONTRAST  Technique:  Contiguous axial images were obtained from the base of the skull through the vertex without contrast.  Comparison: 09/18/2006.  Findings: Posterior scalp hematoma is broad-based and measures up to 9 mm in thickness.  Underlying calvarium is intact.  Mastoids are clear.  Paranasal sinuses are clear.  Stable orbits. Calcified atherosclerosis at the skull base.  Patchy confluent cerebral white matter hypodensity including chronic lacunar infarction extending into the right basal ganglia has not significantly changed. Stable ventricle size and configuration.  No acute  intracranial hemorrhage identified.  No midline shift, mass effect, or evidence of mass lesion.  No suspicious intracranial vascular hyperdensity.  IMPRESSION: 1.  Posterior scalp hematoma without underlying fracture. 2.  Stable chronic small vessel ischemia. No acute intracranial abnormality.  Original Report Authenticated By: Harley Hallmark, M.D.     1. Fall   2. Scalp laceration   3. Dementia       MDM  Patient's daughter has arrived to take her home.  They have the FLA sheet that SW filled out.  They will follow up with Dr. Arthur Holms forfurther care.        Izola Price [place], [place] 12/10/10 1103

## 2010-12-10 NOTE — ED Provider Notes (Signed)
Medical screening examination/treatment/procedure(s) were conducted as a shared visit with non-physician practitioner(s) and myself.  I personally evaluated the patient during the encounter   Camila Norville A. Patrica Duel, MD 12/10/10 1146

## 2010-12-10 NOTE — ED Notes (Signed)
Pt found walking in her room. She is risk for falls. Encouraged pt to please sit on bed, but she forgets. Chaplin in to speak with pt. Television service is down at this time. Facilities is aware of problem

## 2010-12-10 NOTE — ED Notes (Signed)
Pt assisted to walk to the restroom

## 2010-12-10 NOTE — ED Notes (Signed)
Pt son is here and is talking with social worker at this time about planning for pt long term and short term care

## 2010-12-10 NOTE — Progress Notes (Signed)
The patient is currently sleeping. She will be kept in the CDU overnight with a plan to have a social service consult in the morning to try and arrange placement.

## 2010-12-10 NOTE — ED Notes (Signed)
Social worker back in dept. She is discussing discharge and how to get pt into an assisted home

## 2010-12-10 NOTE — ED Notes (Signed)
Pt to be checked frequently due to occassional confusion per daughter.

## 2010-12-10 NOTE — Telephone Encounter (Signed)
Pts daughter called and states that pt was hospitalized for a fall last week. She states her mother's hallucinations are getting worse and the family is looking at putting her in an assisted living facility, She was concerned that dementia is not on her mothers problem list or mentioned at last office visit. She states this information will need to be included to get her mother in an assisted living facility

## 2010-12-10 NOTE — Telephone Encounter (Signed)
k - diagnosis/problem list changed: dementia alzheimer's type with visual disturbance.

## 2010-12-10 NOTE — Telephone Encounter (Signed)
Hallucinosis - the problem of having hallucinations was entered into her problem list. If this is not sufficient let me know.

## 2010-12-10 NOTE — ED Notes (Signed)
Pt daughter at bedside visiting.  

## 2010-12-10 NOTE — Progress Notes (Signed)
Observation review submitted to EHR.

## 2010-12-10 NOTE — ED Notes (Signed)
Pt assisted to walk to restroom

## 2010-12-10 NOTE — ED Provider Notes (Signed)
  Physical Exam  BP 104/75  Pulse 101  Temp(Src) 98.1 F (36.7 C) (Axillary)  Resp 20  SpO2 95%  Physical Exam  ED Course  Procedures  MDM 9:14 AM   Seen by Delice Bison from SW.  Recommends initiate FL2 form for NH or AL placement.  Family will take patient home later this Morning and can pursue further placement as an outpatient.       Cinthya Bors A. Patrica Duel, MD 12/10/10 4098

## 2010-12-10 NOTE — Progress Notes (Signed)
CSW met with Pt and Pt's children. Pt reports confusion over recent events, including her current location. CSW met with Pt's children d/t their concern with Pt returning to living alone. Pt's PCP had recommended ALF this summer, per chart, and EDP agreed. CSW began process to find available ALF per daughter's request. FL2 signed and given to family. Family advised to contact Dr. Debby Bud, Pt's pcp for TB test placement and pt's continued care. Pt's daughter appreciative of staff support.

## 2010-12-10 NOTE — Telephone Encounter (Signed)
Daughter stated st Nurse visit today that facility advised that a Dementia diagnosis is recommended. Daughter is requesting that this can be added to the patients problem list.

## 2010-12-11 LAB — URINE CULTURE
Colony Count: NO GROWTH
Culture  Setup Time: 201211112012
Culture: NO GROWTH

## 2010-12-11 MED ORDER — SULFAMETHOXAZOLE-TRIMETHOPRIM 800-160 MG PO TABS: 1.0000 | ORAL_TABLET | Freq: Two times a day (BID) | ORAL | Status: AC

## 2010-12-11 NOTE — Telephone Encounter (Signed)
Informed pts daughter, she states she thinks Mrs Salsberry may have a UTI you put an order in yesterday for UA pt is unable to get urine in sterile cup. Pt's daughter states the ER had to cath her on Sunday (which was neg) to get urine, but pts daughter thinks she may have a UTI. She will try this morning to get specimen in sterile container provided by the lab. Does she have any other option if this does not work

## 2010-12-11 NOTE — Telephone Encounter (Signed)
Ok for empiric treatment with septra DS bid x 5 days (generic), #10

## 2010-12-11 NOTE — Telephone Encounter (Signed)
Addended byRosalio Macadamia, Vaneta Hammontree B on: 12/11/2010 03:20 PM   Modules accepted: Orders

## 2010-12-27 DIAGNOSIS — G309 Alzheimer's disease, unspecified: Secondary | ICD-10-CM

## 2010-12-27 DIAGNOSIS — M159 Polyosteoarthritis, unspecified: Secondary | ICD-10-CM

## 2010-12-27 DIAGNOSIS — F0280 Dementia in other diseases classified elsewhere without behavioral disturbance: Secondary | ICD-10-CM

## 2010-12-27 DIAGNOSIS — F0281 Dementia in other diseases classified elsewhere with behavioral disturbance: Secondary | ICD-10-CM

## 2010-12-27 DIAGNOSIS — R488 Other symbolic dysfunctions: Secondary | ICD-10-CM

## 2011-01-01 ENCOUNTER — Encounter: Payer: Self-pay | Admitting: Internal Medicine

## 2011-01-02 ENCOUNTER — Encounter: Payer: Self-pay | Admitting: Internal Medicine

## 2011-01-02 ENCOUNTER — Ambulatory Visit (INDEPENDENT_AMBULATORY_CARE_PROVIDER_SITE_OTHER): Payer: Medicare Other | Admitting: Internal Medicine

## 2011-01-02 DIAGNOSIS — J449 Chronic obstructive pulmonary disease, unspecified: Secondary | ICD-10-CM

## 2011-01-02 DIAGNOSIS — G309 Alzheimer's disease, unspecified: Secondary | ICD-10-CM

## 2011-01-02 DIAGNOSIS — I10 Essential (primary) hypertension: Secondary | ICD-10-CM

## 2011-01-02 DIAGNOSIS — F0281 Dementia in other diseases classified elsewhere with behavioral disturbance: Secondary | ICD-10-CM

## 2011-01-02 NOTE — Progress Notes (Signed)
  Subjective:    Patient ID: [name], female    DOB: 1919/11/23, 77 y.o.   MRN: [id number]  HPI [name] was not in her right mind and she went to the church one night and took a fall sustaining a laceration at the occiput but did not require any sutures. She had a CT brain that was normal. She went from the ED to the [facility] for "rehab" where she is participating in physical therapy. She has been doing well. She has been eating and has made an adjustment to the facility. She is still weak and off balance. She has been receiving and needs to continue to receive home health physical therapy and periodic nurse visits. Fortunately she has been doing very well otherwise and has been medically stable.   Past Medical History  Diagnosis Date  . Unspecified hypothyroidism   . Disorder of bone and cartilage, unspecified   . Chronic airway obstruction, not elsewhere classified   . Unspecified essential hypertension   . Esophageal reflux   . Other and unspecified hyperlipidemia    Past Surgical History  Procedure Date  . Cataract extractions     bilateral  . Abdominal hysterectomy     total  . Bilateral salpingoopherectomy    No family history on file. History   Social History  . Marital Status: Widowed    Spouse Name: N/A    Number of Children: N/A  . Years of Education: N/A   Occupational History  . Not on file.   Social History Main Topics  . Smoking status: Never Smoker   . Smokeless tobacco: Not on file  . Alcohol Use: No  . Drug Use: No  . Sexually Active:    Other Topics Concern  . 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 End of Life- pt, in the presence of her daughter, states she doesn't want CPR, mechanical ventilation or heroic/ futile measures            Review of Systems  Constitutional: Positive  for appetite change and unexpected weight change. Negative for activity change and fatigue.  HENT: Negative.   Eyes: Negative.   Respiratory: Negative for cough, chest tightness and wheezing.   Cardiovascular: Negative.   Gastrointestinal: Negative.   Genitourinary: Negative.   Musculoskeletal: Negative.   Neurological: Negative.   Hematological: Negative.   Psychiatric/Behavioral: Positive for confusion and decreased concentration.       Objective:   Physical Exam Vitals - stable with no fever, good BP  Gen'l - well nourished woman who looks her stated age HEENT- scab on occiput, no Battle's sign, no Racoon's eyes, Edentulous Neck - supple Chest - CTAP Cor - 2+ radial, RRR, no murmur Ext - no deformity, no edema Neuro - A&O x 3, memory is not reliable, CN II-XII normal, MS generally OK for her age. Derm- small lesions on the nose, scab on the scalp      Assessment & Plan:  Balance and gait - patient with gait and balance problems  Plan - continued Home Health PT to enhance mobility and safety.

## 2011-01-02 NOTE — Assessment & Plan Note (Signed)
BP Readings from Last 3 Encounters:  01/02/11 114/62  12/10/10 147/77  07/17/10 168/84   Will continue present medications w/o change

## 2011-01-02 NOTE — Assessment & Plan Note (Signed)
Stable with no increased work of breathing. Lungs clear on exam

## 2011-01-02 NOTE — Assessment & Plan Note (Signed)
Continues to have some progressive memory loss, some disorientation. She will benefit from being in a organized and safe environment that provides stimulation along with support in ADLs.

## 2011-01-04 ENCOUNTER — Encounter: Payer: Self-pay | Admitting: Internal Medicine

## 2011-02-01 ENCOUNTER — Telehealth: Payer: Self-pay | Admitting: Internal Medicine

## 2011-02-01 NOTE — Telephone Encounter (Signed)
[place] [facility] NEEDS THE FORMS FROM DR [name] FOR HER TO GET THE PHYSICAL THERAPY.  [name] [facility]

## 2011-03-08 ENCOUNTER — Telehealth: Payer: Self-pay | Admitting: *Deleted

## 2011-03-08 NOTE — Telephone Encounter (Signed)
[name] 02/01/2011 9:02 AM Signed  [facility] [place] [facility] NEEDS THE FORMS FROM DR [name] FOR HER TO GET THE PHYSICAL THERAPY.  Darra Lis) [phone]  Son called requesting orders for patient to [facility], Attn: Anette Riedel, for sinus medication.

## 2011-03-11 NOTE — Telephone Encounter (Signed)
Will sign forms when I see them

## 2011-04-23 ENCOUNTER — Encounter (HOSPITAL_COMMUNITY): Payer: Self-pay | Admitting: *Deleted

## 2011-04-23 ENCOUNTER — Emergency Department (HOSPITAL_COMMUNITY)
Admission: EM | Admit: 2011-04-23 | Discharge: 2011-04-23 | Disposition: A | Discharge status: Home / Self Care | Payer: Medicare Other | Attending: Emergency Medicine | Admitting: Emergency Medicine

## 2011-04-23 DIAGNOSIS — E039 Hypothyroidism, unspecified: Secondary | ICD-10-CM | POA: Insufficient documentation

## 2011-04-23 DIAGNOSIS — T1490XA Injury, unspecified, initial encounter: Secondary | ICD-10-CM | POA: Insufficient documentation

## 2011-04-23 DIAGNOSIS — I10 Essential (primary) hypertension: Secondary | ICD-10-CM | POA: Insufficient documentation

## 2011-04-23 DIAGNOSIS — Z7982 Long term (current) use of aspirin: Secondary | ICD-10-CM | POA: Insufficient documentation

## 2011-04-23 DIAGNOSIS — W19XXXA Unspecified fall, initial encounter: Secondary | ICD-10-CM

## 2011-04-23 NOTE — ED Provider Notes (Signed)
History     CSN: [id number]  Arrival date & time 04/23/11  4098   First MD Initiated Contact with Patient 04/23/11 [phone]      Chief Complaint  Patient presents with  . Fall     The history is provided by the patient and a relative.   the patient reports she was standing at the bathroom sink this morning when she turned to use the commode.  She reports she became off balance and slowly lowered herself to the floor.  She did not lose consciousness.  She had no preceding chest pain or palpitations.  She did not lose consciousness.  She is supposed to walk with a walker because of gait abnormalities which did not have a walker with her.  Staff reported that she hit her head however EMS notes no trauma to her head.  The patient is on a 1 mg aspirin daily but otherwise no blood thinners.  The family is at the patient's bedside and reports that she is at normal baseline mental status.  The patient has no complaints at this time and reports that she feels fine.  She reports that she lost her balance first second and lower herself to the floor.  She denies headache or neck pain.  She has no weakness of her upper lower extremities.  She has no pain with range of motion of her hips shoulders arms or wrist. Past Medical History  Diagnosis Date  . Unspecified hypothyroidism   . Disorder of bone and cartilage, unspecified   . Chronic airway obstruction, not elsewhere classified   . Unspecified essential hypertension   . Esophageal reflux   . Other and unspecified hyperlipidemia     Past Surgical History  Procedure Date  . Cataract extractions     bilateral  . Abdominal hysterectomy     total  . Bilateral salpingoopherectomy     No family history on file.  History  Substance Use Topics  . Smoking status: Never Smoker   . Smokeless tobacco: Not on file  . Alcohol Use: No    OB History    Grav Para Term Preterm Abortions TAB SAB Ect Mult Living                  Review of Systems  All  other systems reviewed and are negative.    Allergies  Review of patient's allergies indicates no known allergies.  Home Medications   Current Outpatient Rx  Name Route Sig Dispense Refill  . ASPIRIN 81 MG PO TABS Oral Take 81 mg by mouth daily.     Marland Kitchen DIPHENHYDRAMINE HCL (SLEEP) 25 MG PO TABS Oral Take 25 mg by mouth as needed. For allergies and sleep    . ENALAPRIL MALEATE 2.5 MG PO TABS  TAKE ONE TABLET BY MOUTH ONCE DAILY 30 tablet 6  . IBUPROFEN 200 MG PO TABS Oral Take 400 mg by mouth every 6 (six) hours as needed. For pain    . LEVOTHYROXINE SODIUM 25 MCG PO TABS Oral Take 1 tablet (25 mcg total) by mouth daily. 30 tablet 11  . SUPER HIGH VITAMINS/MINERALS PO TABS Oral Take 1 tablet by mouth daily.      Marland Kitchen BOOST HIGH PROTEIN PO LIQD Oral Take 237 mLs by mouth daily.        BP 147/54  Pulse 72  Temp(Src) 98.3 F (36.8 C) (Oral)  Resp 16  SpO2 100%  Physical Exam  Nursing note and vitals reviewed. Constitutional: She  is oriented to person, place, and time. She appears well-developed and well-nourished. No distress.  HENT:  Head: Normocephalic and atraumatic.       No swelling or ecchymosis noted  Eyes: EOM are normal. Pupils are equal, round, and reactive to light.  Neck: Normal range of motion. Neck supple.       No cervical spine tenderness or step-offs  Cardiovascular: Normal rate, regular rhythm and normal heart sounds.   Pulmonary/Chest: Effort normal and breath sounds normal.  Abdominal: Soft. She exhibits no distension. There is no tenderness.  Musculoskeletal: Normal range of motion. She exhibits no edema and no tenderness.       Full range of motion of bilateral hips and other major joints.  No thoracic or lumbar spinal tenderness  Neurological: She is alert and oriented to person, place, and time.       5/5 strength in major muscle groups of  bilateral upper and lower extremities. Speech normal. No facial asymetry.  Gait normal  Skin: Skin is warm and dry.    Psychiatric: She has a normal mood and affect. Judgment normal.    ED Course  Procedures (including critical care time)  Labs Reviewed - No data to display No results found.   1. Fall       MDM  Her fall sounds mechanical in nature.  She and related around the ER without difficulty.  She is at baseline mental status.  She's otherwise been well.  She did not have a syncopal episode.  She has no signs of trauma. .  No indication for imaging or labs.  Family is agreeable to outpatient plan.  They understand to return the patient to the ER for new or worsening symptoms        Lyanne Co, MD 04/23/11 1000

## 2011-04-23 NOTE — ED Notes (Signed)
Pt ambulated in hallway with minimal assistance. Pt had no complaint. MD made aware and will work on d/c paperwork. Family made aware.

## 2011-04-23 NOTE — ED Notes (Signed)
Per EMS pt from [facility], pt had unwitnessed fall in bathroom. Pt did not have walker with her. Staff reported knot to head, EMS did not note a knot. No blood thinners. Vital signs stable.

## 2011-04-23 NOTE — ED Notes (Signed)
Bed:WA04<BR> Expected date:<BR> Expected time:<BR> Means of arrival:<BR> Comments:<BR> ems

## 2011-05-23 ENCOUNTER — Encounter: Payer: Self-pay | Admitting: Internal Medicine

## 2011-05-23 ENCOUNTER — Ambulatory Visit (INDEPENDENT_AMBULATORY_CARE_PROVIDER_SITE_OTHER): Payer: Medicare Other | Admitting: Internal Medicine

## 2011-05-23 VITALS — BP 140/68 | HR 77 | Temp 97.7°F | Resp 16 | Wt 111.0 lb

## 2011-05-23 DIAGNOSIS — R350 Frequency of micturition: Secondary | ICD-10-CM

## 2011-05-23 DIAGNOSIS — F0281 Dementia in other diseases classified elsewhere with behavioral disturbance: Secondary | ICD-10-CM

## 2011-05-23 DIAGNOSIS — G309 Alzheimer's disease, unspecified: Secondary | ICD-10-CM

## 2011-05-23 DIAGNOSIS — J4489 Other specified chronic obstructive pulmonary disease: Secondary | ICD-10-CM

## 2011-05-23 DIAGNOSIS — J449 Chronic obstructive pulmonary disease, unspecified: Secondary | ICD-10-CM

## 2011-05-23 DIAGNOSIS — K219 Gastro-esophageal reflux disease without esophagitis: Secondary | ICD-10-CM

## 2011-05-23 DIAGNOSIS — F0280 Dementia in other diseases classified elsewhere without behavioral disturbance: Secondary | ICD-10-CM

## 2011-05-23 DIAGNOSIS — E039 Hypothyroidism, unspecified: Secondary | ICD-10-CM

## 2011-05-23 DIAGNOSIS — F068 Other specified mental disorders due to known physiological condition: Secondary | ICD-10-CM

## 2011-05-23 DIAGNOSIS — I10 Essential (primary) hypertension: Secondary | ICD-10-CM

## 2011-05-23 DIAGNOSIS — E785 Hyperlipidemia, unspecified: Secondary | ICD-10-CM

## 2011-05-23 MED ORDER — SOLIFENACIN SUCCINATE 5 MG PO TABS: 10.0000 mg | ORAL_TABLET | Freq: Every day | ORAL | Status: DC

## 2011-05-23 MED ORDER — LORATADINE 10 MG PO TABS: 10.0000 mg | ORAL_TABLET | Freq: Every day | ORAL | Status: DC

## 2011-05-23 NOTE — Patient Instructions (Signed)
Bladder problems - may be overactive bladder. Plan trial of vesicare 5 mg once a day. You will know in 3-4 days if this works, confirming the diagnosis.  Please stop taking the benadryl. For allergy symptoms it is safer to take loratadine 10 mg once a day - fewer side effects.   Blood pressure is good and you should continue your present medication.   Toenails - you can get a pedicure or you may want to see a podiatrist.  For stomach upset it is ok to use OTC antacid of choice.  Continue all your other medications as listed.

## 2011-05-23 NOTE — Progress Notes (Signed)
  Subjective:    Patient ID: [name], female    DOB: [birth year], 78 y.o.   MRN: [id number]  HPI [name] is a 78 y/o who is here for evaluation for recent dizziness and fall. She was seen at ED with a normal exam. No imaging was done. She had no LOC. She does report that she has intermittent dizziness. She does c/o not feeling well.  She does have a problem with frequent urination and incontinence.   She has increased nail growth and it is impossible for her to cut her nail. No complaints of fungus or other problems.   Past Medical History  Diagnosis Date  . Unspecified hypothyroidism   . Disorder of bone and cartilage, unspecified   . Chronic airway obstruction, not elsewhere classified   . Unspecified essential hypertension   . Esophageal reflux   . Other and unspecified hyperlipidemia    Past Surgical History  Procedure Date  . Cataract extractions     bilateral  . Abdominal hysterectomy     total  . Bilateral salpingoopherectomy    No family history on file. History   Social History  . Marital Status: Widowed    Spouse Name: N/A    Number of Children: N/A  . Years of Education: N/A   Occupational History  . Not on file.   Social History Main Topics  . Smoking status: Never Smoker   . Smokeless tobacco: Not on file  . Alcohol Use: No  . Drug Use: No  . Sexually Active:    Other Topics Concern  . 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 End of Life- pt, in the presence of her daughter, states she doesn't want CPR, mechanical ventilation or heroic/ futile measures     Review of Systems System review is negative for any constitutional, cardiac, pulmonary, GI or neuro symptoms or complaints other than as described in the HPI.     Objective:   Physical Exam Filed Vitals:   05/23/11 1150  BP: 140/68    Pulse: 77  Temp: 97.7 F (36.5 C)  Resp: 16   Wt Readings from Last 3 Encounters:  05/23/11 111 lb (50.349 kg)  01/02/11 110 lb (49.896 kg)  07/17/10 116 lb (52.617 kg)   Gen'l- elderly, wizened white woman in no acute distress HEENT- [place]/AT, C&S clear Cor - RRR Pulm - normal respirations, no wheezing Neuro- A&O x 3, normal motor strength, no tremor, no ataxia. Derm- long, thickened toenails without fungal infection        Assessment & Plan:  Toe-nails : advised either podiatry or regular pedicure.

## 2011-05-26 DIAGNOSIS — R350 Frequency of micturition: Secondary | ICD-10-CM | POA: Insufficient documentation

## 2011-05-26 NOTE — Assessment & Plan Note (Signed)
Lab Results  Component Value Date   TSH 1.35 07/17/2010   Will be due for follow-up lab at next office visit.

## 2011-05-26 NOTE — Assessment & Plan Note (Signed)
At age 78 there is no appreciable benefit in continuing to take antilipemic medication  Plan d/c statin

## 2011-05-26 NOTE — Assessment & Plan Note (Signed)
Stable and doing well. 

## 2011-05-26 NOTE — Assessment & Plan Note (Signed)
Patient asks if she may take H2 blocker drugs for GI symptoms.  Plan  OK to take OTC H2 blocker of choice

## 2011-05-26 NOTE — Assessment & Plan Note (Signed)
Symptoms are very suggestive of mild OAB  Plan  Trial of vesicare 5 mg daily - sample provided.

## 2011-05-26 NOTE — Assessment & Plan Note (Signed)
Formal mental status testing not done. She is cogent at today's visit. Dtr is present and does not report problem with hallucinosis at this time.

## 2011-05-26 NOTE — Assessment & Plan Note (Signed)
BP Readings from Last 3 Encounters:  05/23/11 140/68  04/23/11 147/54  01/02/11 114/62   Adequate control on present medications

## 2011-06-26 DIAGNOSIS — Z0279 Encounter for issue of other medical certificate: Secondary | ICD-10-CM

## 2011-07-02 ENCOUNTER — Telehealth: Payer: Self-pay | Admitting: *Deleted

## 2011-07-02 MED ORDER — CIPROFLOXACIN HCL 250 MG PO TABS: 250.0000 mg | ORAL_TABLET | Freq: Two times a day (BID) | ORAL | Status: AC

## 2011-07-02 NOTE — Telephone Encounter (Signed)
Spoke with med tech Carollee Herter at [facility] concerning medication ordered for patient. Rx faxed to her.

## 2011-07-09 ENCOUNTER — Encounter: Payer: Self-pay | Admitting: Internal Medicine

## 2011-08-01 ENCOUNTER — Emergency Department (HOSPITAL_COMMUNITY): Payer: Medicare Other

## 2011-08-01 ENCOUNTER — Inpatient Hospital Stay (HOSPITAL_COMMUNITY)
Admission: EM | Admit: 2011-08-01 | Discharge: 2011-08-04 | DRG: 378 | Disposition: A | Discharge status: Nursing Home (Includes ICF) | Payer: Medicare Other | Attending: Internal Medicine | Admitting: Internal Medicine

## 2011-08-01 ENCOUNTER — Encounter (HOSPITAL_COMMUNITY): Payer: Self-pay | Admitting: Family Medicine

## 2011-08-01 DIAGNOSIS — K219 Gastro-esophageal reflux disease without esophagitis: Secondary | ICD-10-CM | POA: Diagnosis present

## 2011-08-01 DIAGNOSIS — D649 Anemia, unspecified: Secondary | ICD-10-CM

## 2011-08-01 DIAGNOSIS — I10 Essential (primary) hypertension: Secondary | ICD-10-CM | POA: Diagnosis present

## 2011-08-01 DIAGNOSIS — Z66 Do not resuscitate: Secondary | ICD-10-CM | POA: Diagnosis present

## 2011-08-01 DIAGNOSIS — E039 Hypothyroidism, unspecified: Secondary | ICD-10-CM | POA: Diagnosis present

## 2011-08-01 DIAGNOSIS — R55 Syncope and collapse: Secondary | ICD-10-CM | POA: Diagnosis present

## 2011-08-01 DIAGNOSIS — K922 Gastrointestinal hemorrhage, unspecified: Secondary | ICD-10-CM

## 2011-08-01 DIAGNOSIS — Z7982 Long term (current) use of aspirin: Secondary | ICD-10-CM

## 2011-08-01 DIAGNOSIS — D62 Acute posthemorrhagic anemia: Secondary | ICD-10-CM | POA: Diagnosis present

## 2011-08-01 DIAGNOSIS — R350 Frequency of micturition: Secondary | ICD-10-CM | POA: Diagnosis present

## 2011-08-01 DIAGNOSIS — K921 Melena: Principal | ICD-10-CM | POA: Diagnosis present

## 2011-08-01 LAB — COMPREHENSIVE METABOLIC PANEL
Albumin: 3.6 g/dL (ref 3.5–5.2)
Alkaline Phosphatase: 78 U/L (ref 39–117)
BUN: 15 mg/dL (ref 6–23)
CO2: 25 mEq/L (ref 19–32)
Chloride: 102 mEq/L (ref 96–112)
Creatinine, Ser: 0.8 mg/dL (ref 0.50–1.10)
GFR calc non Af Amer: 63 mL/min — ABNORMAL LOW (ref >90)
Potassium: 3.5 mEq/L (ref 3.5–5.1)
Total Bilirubin: 0.2 mg/dL — ABNORMAL LOW (ref 0.3–1.2)

## 2011-08-01 LAB — URINALYSIS, ROUTINE W REFLEX MICROSCOPIC
Glucose, UA: NEGATIVE mg/dL
Hgb urine dipstick: NEGATIVE
Ketones, ur: NEGATIVE mg/dL
Leukocytes, UA: NEGATIVE
Protein, ur: 30 mg/dL — AB
Urobilinogen, UA: 0.2 mg/dL (ref 0.0–1.0)

## 2011-08-01 LAB — OCCULT BLOOD, POC DEVICE: Fecal Occult Bld: POSITIVE

## 2011-08-01 LAB — DIFFERENTIAL
Lymphocytes Relative: 18 % (ref 12–46)
Lymphs Abs: 1.6 10*3/uL (ref 0.7–4.0)
Monocytes Absolute: 0.8 10*3/uL (ref 0.1–1.0)
Monocytes Relative: 9 % (ref 3–12)
Neutro Abs: 6.6 10*3/uL (ref 1.7–7.7)
Neutrophils Relative %: 72 % (ref 43–77)

## 2011-08-01 LAB — CBC
MCH: 25.8 pg — ABNORMAL LOW (ref 26.0–34.0)
MCV: 83.5 fL (ref 78.0–100.0)
Platelets: 250 10*3/uL (ref 150–400)
RDW: 13.3 % (ref 11.5–15.5)

## 2011-08-01 LAB — URINE MICROSCOPIC-ADD ON

## 2011-08-01 LAB — TYPE AND SCREEN: ABO/RH(D): O POS

## 2011-08-01 LAB — MRSA PCR SCREENING: MRSA by PCR: NEGATIVE

## 2011-08-01 LAB — POCT I-STAT TROPONIN I: Troponin i, poc: 0 ng/mL (ref 0.00–0.08)

## 2011-08-01 LAB — ABO/RH: ABO/RH(D): O POS

## 2011-08-01 MED ORDER — ACETAMINOPHEN 325 MG PO TABS: 650.0000 mg | ORAL_TABLET | Freq: Four times a day (QID) | ORAL | Status: DC | PRN

## 2011-08-01 MED ORDER — ACETAMINOPHEN 650 MG RE SUPP: 650.0000 mg | Freq: Four times a day (QID) | RECTAL | Status: DC | PRN

## 2011-08-01 MED ORDER — OXYBUTYNIN CHLORIDE ER 5 MG PO TB24
5.0000 mg | ORAL_TABLET | Freq: Every day | ORAL | Status: DC
  Administered 2011-08-02 – 2011-08-04 (×3): 5 mg via ORAL
  Filled 2011-08-01 (×3): qty 1

## 2011-08-01 MED ORDER — BOOST PLUS PO LIQD
237.0000 mL | Freq: Every day | ORAL | Status: DC
  Administered 2011-08-01 – 2011-08-04 (×4): 237 mL via ORAL
  Filled 2011-08-01 (×4): qty 237

## 2011-08-01 MED ORDER — ONDANSETRON HCL 4 MG/2ML IJ SOLN: 4.0000 mg | Freq: Three times a day (TID) | INTRAMUSCULAR | Status: DC | PRN

## 2011-08-01 MED ORDER — ONDANSETRON HCL 4 MG/2ML IJ SOLN
INTRAMUSCULAR | Status: AC
  Filled 2011-08-01: qty 2

## 2011-08-01 MED ORDER — BOOST HIGH PROTEIN PO LIQD
237.0000 mL | Freq: Every day | ORAL | Status: DC
  Filled 2011-08-01: qty 1

## 2011-08-01 MED ORDER — LORATADINE 10 MG PO TABS
10.0000 mg | ORAL_TABLET | Freq: Every day | ORAL | Status: DC | PRN
  Filled 2011-08-01: qty 1

## 2011-08-01 MED ORDER — ALUM & MAG HYDROXIDE-SIMETH 200-200-20 MG/5ML PO SUSP
30.0000 mL | Freq: Four times a day (QID) | ORAL | Status: DC | PRN
  Administered 2011-08-04: 30 mL via ORAL
  Filled 2011-08-01: qty 30

## 2011-08-01 MED ORDER — POTASSIUM CHLORIDE IN NACL 20-0.9 MEQ/L-% IV SOLN
INTRAVENOUS | Status: DC
  Administered 2011-08-02: 10 mL/h via INTRAVENOUS
  Filled 2011-08-01 (×3): qty 1000

## 2011-08-01 MED ORDER — ADULT MULTIVITAMIN W/MINERALS CH
1.0000 | ORAL_TABLET | Freq: Every day | ORAL | Status: DC
  Administered 2011-08-02 – 2011-08-04 (×3): 1 via ORAL
  Filled 2011-08-01 (×4): qty 1

## 2011-08-01 MED ORDER — ONDANSETRON HCL 4 MG PO TABS: 4.0000 mg | ORAL_TABLET | Freq: Four times a day (QID) | ORAL | Status: DC | PRN

## 2011-08-01 MED ORDER — LEVOTHYROXINE SODIUM 25 MCG PO TABS
25.0000 ug | ORAL_TABLET | Freq: Every day | ORAL | Status: DC
  Administered 2011-08-02 – 2011-08-04 (×3): 25 ug via ORAL
  Filled 2011-08-01 (×4): qty 1

## 2011-08-01 MED ORDER — ONDANSETRON HCL 4 MG/2ML IJ SOLN
INTRAMUSCULAR | Status: AC
  Administered 2011-08-01: 13:00:00
  Filled 2011-08-01: qty 2

## 2011-08-01 MED ORDER — PANTOPRAZOLE SODIUM 40 MG IV SOLR
40.0000 mg | Freq: Two times a day (BID) | INTRAVENOUS | Status: DC
  Administered 2011-08-01 – 2011-08-03 (×4): 40 mg via INTRAVENOUS
  Filled 2011-08-01 (×5): qty 40

## 2011-08-01 MED ORDER — DEXTROSE-NACL 5-0.45 % IV SOLN
INTRAVENOUS | Status: AC
  Administered 2011-08-01: 17:00:00 via INTRAVENOUS

## 2011-08-01 MED ORDER — ONDANSETRON HCL 4 MG/2ML IJ SOLN
4.0000 mg | Freq: Once | INTRAMUSCULAR | Status: AC
  Administered 2011-08-01: 4 mg via INTRAVENOUS
  Filled 2011-08-01: qty 2

## 2011-08-01 MED ORDER — ONDANSETRON HCL 4 MG/2ML IJ SOLN: 4.0000 mg | Freq: Four times a day (QID) | INTRAMUSCULAR | Status: DC | PRN

## 2011-08-01 NOTE — ED Notes (Signed)
MD at bedside. 

## 2011-08-01 NOTE — ED Notes (Signed)
NFA:[id number]<QM> Expected date:08/01/11<BR> Expected time:12:31 PM<BR> Means of arrival:Ambulance<BR> Comments:<BR> syncope

## 2011-08-01 NOTE — ED Provider Notes (Signed)
History     CSN: [id number]  Arrival date & time 08/01/11  1235   First MD Initiated Contact with Patient 08/01/11 1243      Chief Complaint  Patient presents with  . Loss of Consciousness    HPI Pt was at the beauty salon sitting under the hair dryer.  That is the last thing she remembers.  Pt apparently had a syncopal episode.    Pt thinks she might have had a bad sausage for breakfast but she has not had any pain.  She did vomit after the syncopal episode.  No chest pain, headache or stomach pain.  PT has had fainting spells before but she is not sure why.  Past Medical History  Diagnosis Date  . Unspecified hypothyroidism   . Disorder of bone and cartilage, unspecified   . Chronic airway obstruction, not elsewhere classified   . Unspecified essential hypertension   . Esophageal reflux   . Other and unspecified hyperlipidemia     Past Surgical History  Procedure Date  . Cataract extractions     bilateral  . Abdominal hysterectomy     total  . Bilateral salpingoopherectomy     History reviewed. No pertinent family history.  History  Substance Use Topics  . Smoking status: Never Smoker   . Smokeless tobacco: Not on file  . Alcohol Use: No    OB History    Grav Para Term Preterm Abortions TAB SAB Ect Mult Living                  Review of Systems  All other systems reviewed and are negative.    Allergies  Review of patient's allergies indicates no known allergies.  Home Medications   Current Outpatient Rx  Name Route Sig Dispense Refill  . ASPIRIN 81 MG PO TABS Oral Take 81 mg by mouth daily.     . IBUPROFEN 200 MG PO TABS Oral Take 400 mg by mouth every 6 (six) hours as needed. For pain    . LEVOTHYROXINE SODIUM 25 MCG PO TABS Oral Take 1 tablet (25 mcg total) by mouth daily. 30 tablet 11  . LORATADINE 10 MG PO TABS Oral Take 1 tablet (10 mg total) by mouth daily. 90 tablet 3  . SUPER HIGH VITAMINS/MINERALS PO TABS Oral Take 1 tablet by mouth daily.       Marland Kitchen BOOST HIGH PROTEIN PO LIQD Oral Take 237 mLs by mouth daily.      Marland Kitchen SOLIFENACIN SUCCINATE 5 MG PO TABS Oral Take 2 tablets (10 mg total) by mouth daily. 7 tablet 0    BP 137/63  Pulse 76  Temp 97.6 F (36.4 C) (Oral)  Resp 20  SpO2 97%  Physical Exam  Nursing note and vitals reviewed. Constitutional: She is oriented to person, place, and time. No distress.       Frail, elderly, thin  HENT:  Head: Normocephalic and atraumatic.  Right Ear: External ear normal.  Left Ear: External ear normal.  Mouth/Throat: Oropharynx is clear and moist.  Eyes: Conjunctivae are normal. Right eye exhibits no discharge. Left eye exhibits no discharge. No scleral icterus.  Neck: Neck supple. No tracheal deviation present.  Cardiovascular: Normal rate, regular rhythm and intact distal pulses.   Pulmonary/Chest: Effort normal and breath sounds normal. No stridor. No respiratory distress. She has no wheezes. She has no rales.  Abdominal: Soft. Bowel sounds are normal. She exhibits no distension and no mass. There is no tenderness. There  is no rebound and no guarding.  Musculoskeletal: She exhibits no edema and no tenderness.  Neurological: She is alert and oriented to person, place, and time. She has normal strength. No cranial nerve deficit ( no gross defecits noted) or sensory deficit. She exhibits normal muscle tone. She displays no seizure activity. Coordination normal.       No pronator drift bilateral upper extrem, able to hold both legs off bed for 5 seconds, sensation intact in all extremities, no visual field cuts, no left or right sided neglect  Skin: Skin is warm and dry. No rash noted.  Psychiatric: She has a normal mood and affect.    ED Course  Procedures (including critical care time)  Rate: 76  Rhythm: normal sinus rhythm  QRS Axis: normal  Intervals: normal  ST/T Wave abnormalities: normal  Conduction Disutrbances:none  Narrative Interpretation: early precordial r/s transition   Old EKG Reviewed: no change Labs Reviewed  CBC - Abnormal; Notable for the following:    RBC 3.33 (*)     Hemoglobin 8.6 (*)     HCT 27.8 (*)     MCH 25.8 (*)     All other components within normal limits  COMPREHENSIVE METABOLIC PANEL - Abnormal; Notable for the following:    Glucose, Bld 108 (*)     Total Bilirubin 0.2 (*)     GFR calc non Af Amer 63 (*)     GFR calc Af Amer 73 (*)     All other components within normal limits  GLUCOSE, CAPILLARY - Abnormal; Notable for the following:    Glucose-Capillary 109 (*)     All other components within normal limits  DIFFERENTIAL  POCT I-STAT TROPONIN I  OCCULT BLOOD, POC DEVICE  POCT CBG (FASTING - GLUCOSE)-MANUAL ENTRY  URINALYSIS, ROUTINE W REFLEX MICROSCOPIC  TYPE AND SCREEN   Dg Chest 2 View  08/01/2011  *RADIOLOGY REPORT*  Clinical Data: Syncope.  Nausea and vomiting.  CHEST - 2 VIEW  Comparison: PA and lateral chest 12/09/2010.  Findings: The chest is hyperexpanded but the lungs are clear. Heart size is normal.  Hiatal hernia noted.  IMPRESSION:  1.  No acute finding. 2.  Pulmonary hyperexpansion compatible with emphysema. 3.  Hiatal hernia.  Original Report Authenticated By: Bernadene Bell. Maricela Curet, M.D.   Ct Head Wo Contrast  08/01/2011  *RADIOLOGY REPORT*  Clinical Data: Passed out.  Loss of consciousness.  CT HEAD WITHOUT CONTRAST  Technique:  Contiguous axial images were obtained from the base of the skull through the vertex without contrast.  Comparison: 12/09/2010.  Findings: No skull fracture or intracranial hemorrhage.  Global atrophy.  Ventricular prominence felt to be related to atrophy without hydrocephalus.  Small vessel disease type changes.  No CT evidence of large acute infarct. Remote right lenticular nucleus infarct.  No intracranial mass lesion detected on this unenhanced exam.  Vascular calcifications.  IMPRESSION: No skull fracture or intracranial hemorrhage.  No CT evidence of large acute infarct.  Small vessel disease  type changes.  Remote right lenticular nucleus infarct.  Global atrophy without hydrocephalus.  Vascular calcifications.  Original Report Authenticated By: Fuller Canada, M.D.    MDM  Pt continues to have nausea.  Anemia is progressively worse and acutely lower than previously.  Stool is dark and guiac positive.  Will admit to the hospital for further evaluation of possible GI bleed and syncope.  Pt remains hemodynamically stable.  Findings were discussed with patient and her son.  Celene Kras, MD 08/01/11 1452

## 2011-08-01 NOTE — ED Notes (Signed)
Pt back from CT

## 2011-08-01 NOTE — ED Notes (Signed)
Phlebotomy at bedside.

## 2011-08-01 NOTE — ED Notes (Signed)
Per EMS: Pt was at beauty salon sitting under hair dryer when she had a syncopal episode. States she was out for about 5 minutes. Denies any chest pain or sob. Reports episode of vomiting. Pt still feeling nauseated. 20G IV started to left hand and 4 mg zofran given IVP pta. States pt was conscious and alert at time of arrival.

## 2011-08-01 NOTE — ED Notes (Signed)
Patient transported to X-ray 

## 2011-08-01 NOTE — ED Notes (Signed)
Completed orthostatic VS on pt. When pt stood up to get last set of VS pt felt dizzy and lightheaded and feels nauseous. Nurse notified of situation.

## 2011-08-01 NOTE — ED Notes (Signed)
Report given to Mora RN

## 2011-08-01 NOTE — ED Notes (Signed)
Pt reports she thinks she ate some undercooked sausage at breakfast this morning.

## 2011-08-01 NOTE — H&P (Signed)
Hospital Admission Note Date: 08/01/2011  Patient name: [name] Medical record number: [id number] Date of birth: [birth year] Age: 78 y.o. Gender: female PCP: Illene Regulus, MD  Attending physician: Maryruth Bun Varonica Siharath, MD Emergency Contact:  Josephina Gip (daughter) [phone]; Enjoli Tidd (son) [phone] Code Status: DNR  Chief Complaint: Abdominal discomfort, passing out spell.  History of Present Illness: [name] is an 78 y.o. female with a PMH of dizziness and falls, on chronic ASA therapy who was brought to the hospital via EMS after passing out while under the hair dryer at the beautician's parlour.  The patient reports having had an upset stomach after eating a sausage for breakfast this morning.  She vomited after she came to.  The patient has dementia, and some of the history is provided by her daughter, who states that the patient has had episodes of syncope in the past which have been attributed to vasovagal phenomenon.  Initial work up in the ER showed a 2 mg/dL drop in her baseline hemoglobin, and a rectal exam positive for black/guiac positive stools so she was referred for admission.  The patient denies any knowledge of recent black or bloody stools.  Past Medical History Past Medical History  Diagnosis Date  . Unspecified hypothyroidism   . Disorder of bone and cartilage, unspecified   . Chronic airway obstruction, not elsewhere classified   . Unspecified essential hypertension   . Esophageal reflux   . Other and unspecified hyperlipidemia     Past Surgical History Past Surgical History  Procedure Date  . Cataract extractions     bilateral  . Abdominal hysterectomy     total  . Bilateral salpingoopherectomy   . Rectal polyp removal     No malignancy identified    Meds: Prior to Admission medications   Medication Sig Start Date End Date Taking? Authorizing Provider  aspirin 81 MG tablet Take 81 mg by mouth daily.    Yes Historical Provider,  MD  diphenhydrAMINE (BENADRYL) 25 mg capsule Take 25 mg by mouth at bedtime as needed. Allergies and sleep   Yes Historical Provider, MD  enalapril (VASOTEC) 2.5 MG tablet Take 2.5 mg by mouth daily.   Yes Historical Provider, MD  ibuprofen (ADVIL,MOTRIN) 200 MG tablet Take 400 mg by mouth every 6 (six) hours as needed. For pain   Yes Historical Provider, MD  levothyroxine (SYNTHROID, LEVOTHROID) 25 MCG tablet Take 1 tablet (25 mcg total) by mouth daily. 07/17/10  Yes Jacques Navy, MD  loratadine (CLARITIN) 10 MG tablet Take 10 mg by mouth daily as needed. allergies 05/23/11 05/22/12 Yes Jacques Navy, MD  Multiple Vitamins-Minerals (MULTIVITAMIN,TX-MINERALS) tablet Take 1 tablet by mouth daily.     Yes Historical Provider, MD  nutritional supplement (BOOST HIGH PROTEIN) LIQD Take 237 mLs by mouth daily.     Yes Historical Provider, MD  oxybutynin (DITROPAN-XL) 5 MG 24 hr tablet Take 5 mg by mouth daily.   Yes Historical Provider, MD    Allergies: Review of patient's allergies indicates no known allergies.  Social History: History   Social History  . Marital Status: Widowed    Spouse Name: N/A    Number of Children: 2  . Years of Education: N/A   Occupational History  . Homemaker    Social History Main Topics  . Smoking status: Never Smoker   . Smokeless tobacco: Never Used  . Alcohol Use: No  . Drug Use: No  . Sexually Active: Not on file  Other Topics Concern  . 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 Resides in [facility] since 11/12.  Ambulates with walker. End of Life- pt, in the presence of her daughter, states she doesn't want CPR, mechanical ventilation or heroic/ futile measures. Daughter reports some hallucinations and wandering prior to her admission to an ALF.    Family History:  Family History  Problem Relation Age of Onset  . Heart failure Mother   . Cancer Father     Review of Systems: Constitutional:  No fever, no chills;  Appetite fair; No weight loss, no weight gain.  HEENT: No blurry vision, no diplopia, no pharyngitis, no dysphagia CV: No chest pain, no palpitations.  Resp: No SOB, occasional dry cough. GI: + nausea, + vomiting, no diarrhea, + melena, no hematochezia.  GU: No dysuria, no hematuria.  MSK: no myalgias, no arthralgias.  Neuro:  No headache, no focal neurological deficits, no history of seizures.  Psych: No depression, no anxiety.  Endo: + thyroid disease, no DM, no heat intolerance, no cold intolerance, no polyuria, no polydipsia  Skin: No rashes, no skin lesions.  Heme: No easy bruising, no history of blood diseases.   Physical Exam: Blood pressure 142/61, pulse 87, temperature 97.6 F (36.4 C), temperature source Oral, resp. rate 18, SpO2 100.00%. BP 142/61  Pulse 87  Temp 97.6 F (36.4 C) (Oral)  Resp 18  SpO2 100%  General Appearance:    Alert, cooperative, mildly disoriented/confused, appears stated age  Head:    Normocephalic, without obvious abnormality, atraumatic  Eyes:    PERRL, conjunctiva/corneas clear, EOM's intact  Ears:    Normal external ear canals, both ears  Nose:   Nares normal, septum midline, mucosa normal, no drainage    or sinus tenderness  Throat:   Lips, mucosa, and tongue normal; edentulous with dentures  Neck:   Supple, symmetrical, trachea midline, no adenopathy;    thyroid:  no enlargement/tenderness/nodules; no carotid   bruit or JVD  Back:     Symmetric, no curvature, ROM normal, no CVA tenderness  Lungs:     Clear to auscultation bilaterally, respirations unlabored  Chest Wall:    No tenderness or deformity   Heart:    Regular rate and rhythm, S1 and S2 normal, no murmur, rub   or gallop  Abdomen:     Soft, non-tender, bowel sounds active all four quadrants,    no masses, no organomegaly  Rectal:    Deferred (done by EDP with reported black guiac + stool)  Extremities:   Extremities normal, atraumatic, no cyanosis or edema  Pulses:    2+ and symmetric all extremities  Skin:   Skin color, texture, turgor normal, no rashes or lesions  Lymph nodes:   Cervical, supraclavicular, and axillary nodes normal  Neurologic:   CNII-XII intact, generalized weakness, non-focal   Lab results: Basic Metabolic Panel:  Lab 08/01/11 7846  NA 138  K 3.5  CL 102  CO2 25  GLUCOSE 108*  BUN 15  CREATININE 0.80  CALCIUM 9.2  MG --  PHOS --   GFR The CrCl is unknown because both a height and weight (above a minimum accepted value) are required for this calculation. Liver Function Tests:  Lab 08/01/11 1317  AST 19  ALT 10  ALKPHOS 78  BILITOT 0.2*  PROT 6.2  ALBUMIN 3.6   CBC:  Lab 08/01/11 1317  WBC 9.1  NEUTROABS 6.6  HGB 8.6*  HCT 27.8*  MCV 83.5  PLT 250   CBG:  Lab 08/01/11 1341  GLUCAP 109*    Imaging results:  Dg Chest 2 View  08/01/2011  *RADIOLOGY REPORT*  Clinical Data: Syncope.  Nausea and vomiting.  CHEST - 2 VIEW  Comparison: PA and lateral chest 12/09/2010.  Findings: The chest is hyperexpanded but the lungs are clear. Heart size is normal.  Hiatal hernia noted.  IMPRESSION:  1.  No acute finding. 2.  Pulmonary hyperexpansion compatible with emphysema. 3.  Hiatal hernia.  Original Report Authenticated By: Bernadene Bell. Maricela Curet, M.D.   Ct Head Wo Contrast  08/01/2011  *RADIOLOGY REPORT*  Clinical Data: Passed out.  Loss of consciousness.  CT HEAD WITHOUT CONTRAST  Technique:  Contiguous axial images were obtained from the base of the skull through the vertex without contrast.  Comparison: 12/09/2010.  Findings: No skull fracture or intracranial hemorrhage.  Global atrophy.  Ventricular prominence felt to be related to atrophy without hydrocephalus.  Small vessel disease type changes.  No CT evidence of large acute infarct. Remote right lenticular nucleus infarct.  No intracranial mass lesion detected on this unenhanced exam.  Vascular calcifications.  IMPRESSION: No skull fracture or intracranial hemorrhage.  No  CT evidence of large acute infarct.  Small vessel disease type changes.  Remote right lenticular nucleus infarct.  Global atrophy without hydrocephalus.  Vascular calcifications.  Original Report Authenticated By: Fuller Canada, M.D.    Assessment & Plan: Principal Problem:  *Syncope, likely vasovagal in the setting of possible GIB  Currently hemodynamically stable.  Will gently hydrate, check orthostatics. Active Problems:  HYPOTHYROIDISM  Continue Synthroid.  HYPERTENSION  Hold Vasotec for now.  GERD  High dose PPI therapy given Melena, history of ASA/NSAID use. Urinary frequency  Continue Ditropan for OAB.  Melena in the setting of chronic aspirin therapy  Hold ASA and discontinue Motrin.  Monitor hemoglobin.  Start high dose PPI therapy.  Consider GI consult if hemoglobin continues to drop.  Prophylaxis: SCDs for DVT prophylaxis.  Time Spent On Admission: 1 hour.  Darvin Dials 08/01/2011, 4:39 PM Pager [phone]

## 2011-08-02 LAB — BASIC METABOLIC PANEL
BUN: 12 mg/dL (ref 6–23)
Calcium: 9 mg/dL (ref 8.4–10.5)
Creatinine, Ser: 0.84 mg/dL (ref 0.50–1.10)
GFR calc Af Amer: 69 mL/min — ABNORMAL LOW (ref >90)
GFR calc non Af Amer: 59 mL/min — ABNORMAL LOW (ref >90)
Glucose, Bld: 104 mg/dL — ABNORMAL HIGH (ref 70–99)
Potassium: 3.8 mEq/L (ref 3.5–5.1)

## 2011-08-02 LAB — CBC
MCH: 25.6 pg — ABNORMAL LOW (ref 26.0–34.0)
MCHC: 30.5 g/dL (ref 30.0–36.0)
Platelets: 237 10*3/uL (ref 150–400)
RDW: 13.4 % (ref 11.5–15.5)

## 2011-08-02 LAB — HEMOGLOBIN AND HEMATOCRIT, BLOOD: Hemoglobin: 8.1 g/dL — ABNORMAL LOW (ref 12.0–15.0)

## 2011-08-02 NOTE — Progress Notes (Signed)
Clinical Social Work Department BRIEF PSYCHOSOCIAL ASSESSMENT 08/02/2011  Patient:  [name], [name]     Account Number:  [id number]     Admit date:  08/01/2011  Clinical Social Worker:  Tommi Emery, CLINICAL SOCIAL WORKER  Date/Time:  08/02/2011 03:04 PM  Referred by:  Physician  Date Referred:  08/01/2011 Referred for  ALF Placement  Advanced Directives   Other Referral:   Interview type:  Family Other interview type:   spoke with the daughte, while the patient was sleep in the room.    PSYCHOSOCIAL DATA Living Status:  FACILITY Admitted from facility:  [facility] [address] Level of care:  Assisted Living Primary support name:  Bonita Quin Primary support relationship to patient:  CHILD, ADULT Degree of support available:   good; the daugther is a little worried about her mom. but she has been through this before and is handeling her mom being in the hospital a little better.    CURRENT CONCERNS Current Concerns  Adjustment to Illness   Other Concerns:    SOCIAL WORK ASSESSMENT / PLAN the family may need help with feeling out the medical power of autorney paperwork. she also ready is health care power ofattorney, but needs mor information about becoming medical power of attourney.   Assessment/plan status:  Psychosocial Support/Ongoing Assessment of Needs Other assessment/ plan:   Information/referral to community resources:    PATIENT'S/FAMILY'S RESPONSE TO PLAN OF CARE: the family was given the paper work and will look over it. they will consult if when they need more help. the family expressed some concern about transportation after dischrage. the family feels as through they want to transport the patient.        Kayleen Memos. Leighton Ruff [phone]

## 2011-08-02 NOTE — Progress Notes (Signed)
Subjective: Patient is alert and conversive today. No complaints of abdominal pain. No lightheadedness or dizziness. States that she ate 2 sausages yesterday and she said looked actually not well enough code. She was having her hair done and had syncope. She has a history of vasovagal syncope. She was found to have dark stools that were heme positive and was anemic and admitted. She has been on both aspirin and ibuprofen  Objective: Weight change:   Intake/Output Summary (Last 24 hours) at 08/02/11 0824 Last data filed at 08/02/11 0102  Gross per 24 hour  Intake    650 ml  Output      0 ml  Net    650 ml   Filed Vitals:   08/01/11 2137 08/02/11 0620 08/02/11 0621 08/02/11 0622  BP: 156/69 150/66 134/64 120/67  Pulse: 99 72 80 89  Temp: 98.2 F (36.8 C) 98.8 F (37.1 C)    TempSrc: Oral Oral    Resp: 18 18 18 19   Height:      Weight:      SpO2: 97% 96% 97% 98%   Gen.:  Alert conversive elderly female Lungs:  Lungs:  Clear to auscultation bilaterally, respirations unlabored  Heart:Regular rate and rhythm, S1 and S2 normal, no murmur, rub or gallop Abdomen: Soft, non-tender, bowel sounds active all four quadrants,  no masses, no organomegaly Extremities: Without edema Neurological: Oriented x2, nonfocal    Lab Results:  [facility] 08/02/11 0411 08/01/11 1317  NA 138 138  K 3.8 3.5  CL 104 102  CO2 26 25  GLUCOSE 104* 108*  BUN 12 15  CREATININE 0.84 0.80  CALCIUM 9.0 9.2  MG -- --  PHOS -- --    Basename 08/01/11 1317  AST 19  ALT 10  ALKPHOS 78  BILITOT 0.2*  PROT 6.2  ALBUMIN 3.6   No results found for this basename: LIPASE:2,AMYLASE:2 in the last 72 hours  Basename 08/02/11 0411 08/01/11 1317  WBC 6.2 9.1  NEUTROABS -- 6.6  HGB 8.1* 8.6*  HCT 26.6* 27.8*  MCV 84.2 83.5  PLT 237 250   No results found for this basename: CKTOTAL:3,CKMB:3,CKMBINDEX:3,TROPONINI:3 in the last 72 hours No components found with this basename: POCBNP:3 No results found  for this basename: DDIMER:2 in the last 72 hours No results found for this basename: HGBA1C:2 in the last 72 hours No results found for this basename: CHOL:2,HDL:2,LDLCALC:2,TRIG:2,CHOLHDL:2,LDLDIRECT:2 in the last 72 hours No results found for this basename: TSH,T4TOTAL,FREET3,T3FREE,THYROIDAB in the last 72 hours No results found for this basename: VITAMINB12:2,FOLATE:2,FERRITIN:2,TIBC:2,IRON:2,RETICCTPCT:2 in the last 72 hours  Studies/Results: Dg Chest 2 View  08/01/2011  *RADIOLOGY REPORT*  Clinical Data: Syncope.  Nausea and vomiting.  CHEST - 2 VIEW  Comparison: PA and lateral chest 12/09/2010.  Findings: The chest is hyperexpanded but the lungs are clear. Heart size is normal.  Hiatal hernia noted.  IMPRESSION:  1.  No acute finding. 2.  Pulmonary hyperexpansion compatible with emphysema. 3.  Hiatal hernia.  Original Report Authenticated By: Bernadene Bell. Maricela Curet, M.D.   Ct Head Wo Contrast  08/01/2011  *RADIOLOGY REPORT*  Clinical Data: Passed out.  Loss of consciousness.  CT HEAD WITHOUT CONTRAST  Technique:  Contiguous axial images were obtained from the base of the skull through the vertex without contrast.  Comparison: 12/09/2010.  Findings: No skull fracture or intracranial hemorrhage.  Global atrophy.  Ventricular prominence felt to be related to atrophy without hydrocephalus.  Small vessel disease type changes.  No CT evidence of  large acute infarct. Remote right lenticular nucleus infarct.  No intracranial mass lesion detected on this unenhanced exam.  Vascular calcifications.  IMPRESSION: No skull fracture or intracranial hemorrhage.  No CT evidence of large acute infarct.  Small vessel disease type changes.  Remote right lenticular nucleus infarct.  Global atrophy without hydrocephalus.  Vascular calcifications.  Original Report Authenticated By: Fuller Canada, M.D.   Medications: Scheduled Meds:   . dextrose 5 % and 0.45% NaCl   Intravenous STAT  . lactose free nutrition  237 mL  Oral Daily  . levothyroxine  25 mcg Oral Daily  . multivitamin with minerals  1 tablet Oral Daily  . ondansetron      . ondansetron (ZOFRAN) IV  4 mg Intravenous Once  . oxybutynin  5 mg Oral Daily  . pantoprazole (PROTONIX) IV  40 mg Intravenous Q12H  . DISCONTD: feeding supplement  237 mL Oral Daily   Continuous Infusions:   . 0.9 % NaCl with KCl 20 mEq / L     PRN Meds:.acetaminophen, acetaminophen, alum & mag hydroxide-simeth, loratadine, ondansetron (ZOFRAN) IV, ondansetron, DISCONTD: ondansetron (ZOFRAN) IV  Assessment/Plan:  Principal Problem:  *Syncope, likely vasovagal in the setting of possible GIB  Currently hemodynamically stable.  Active Problems:  HYPOTHYROIDISM  Continue Synthroid. HYPERTENSION  Hold Vasotec for now. GERD  High dose PPI therapy given Melena, history of ASA/NSAID use. Urinary frequency  Continue Ditropan for OAB. Melena in the setting of chronic aspirin therapy  Hold ASA and Motrin.  Monitor hemoglobin this afternoon and in the a.m.  Continue high dose PPI therapy.  Consider GI consult if hemoglobin continues to drop - currently relatively stable. If hemoglobin drops below 8, we'll consult GI Prophylaxis: SCDs for DVT prophylaxis. FEN:  Clear liquid diet, advance as tolerated Activity:   Out of bed to chair and ambulate with assistance      LOS: 1 day   Grasiela Jonsson NEVILL 08/02/2011, 8:24 AM

## 2011-08-03 LAB — CBC WITH DIFFERENTIAL/PLATELET
Basophils Relative: 1 % (ref 0–1)
Eosinophils Absolute: 0.2 10*3/uL (ref 0.0–0.7)
Eosinophils Relative: 5 % (ref 0–5)
Lymphs Abs: 1.3 10*3/uL (ref 0.7–4.0)
MCH: 25.5 pg — ABNORMAL LOW (ref 26.0–34.0)
MCHC: 30.4 g/dL (ref 30.0–36.0)
MCV: 83.9 fL (ref 78.0–100.0)
Neutrophils Relative %: 57 % (ref 43–77)
Platelets: 232 10*3/uL (ref 150–400)

## 2011-08-03 MED ORDER — PANTOPRAZOLE SODIUM 40 MG PO TBEC
40.0000 mg | DELAYED_RELEASE_TABLET | Freq: Every day | ORAL | Status: DC
  Administered 2011-08-03 – 2011-08-04 (×2): 40 mg via ORAL
  Filled 2011-08-03 (×2): qty 1

## 2011-08-03 MED ORDER — ENALAPRIL MALEATE 2.5 MG PO TABS
2.5000 mg | ORAL_TABLET | Freq: Every day | ORAL | Status: DC
  Administered 2011-08-03 – 2011-08-04 (×2): 2.5 mg via ORAL
  Filled 2011-08-03 (×2): qty 1

## 2011-08-03 NOTE — Progress Notes (Signed)
CSW met with family/Pt to discuss Living Will and HCPOA information.   Family had concerns about the differences between the two.  CSW answered questions and recommended the daughter discuss the decisions over with the Pt and other family members.   CSW provided Pt/Family contact information to contact CSW  with any additional questions.    Leron Croak, LCSWA [employer] [phone]

## 2011-08-03 NOTE — Progress Notes (Signed)
Subjective: Feels okay. No pain. No dizziness, palpitations.   Objective:  Vital Signs: Filed Vitals:   08/02/11 0622 08/02/11 1416 08/02/11 2023 08/03/11 0535  BP: 120/67 120/68 144/68 173/90  Pulse: 89 70 69 91  Temp:  98.6 F (37 C) 98.2 F (36.8 C) 98.2 F (36.8 C)  TempSrc:   Oral Oral  Resp: 19 16 18 16   Height:      Weight:      SpO2: 98% 97% 96% 99%     EXAM: Alert and oriented   Intake/Output Summary (Last 24 hours) at 08/03/11 0946 Last data filed at 08/02/11 1805  Gross per 24 hour  Intake   1200 ml  Output      0 ml  Net   1200 ml    Lab Results:  Basename 08/02/11 0411 08/01/11 1317  NA 138 138  K 3.8 3.5  CL 104 102  CO2 26 25  GLUCOSE 104* 108*  BUN 12 15  CREATININE 0.84 0.80  CALCIUM 9.0 9.2  MG -- --  PHOS -- --    Basename 08/01/11 1317  AST 19  ALT 10  ALKPHOS 78  BILITOT 0.2*  PROT 6.2  ALBUMIN 3.6   No results found for this basename: LIPASE:2,AMYLASE:2 in the last 72 hours  Basename 08/03/11 0426 08/02/11 1505 08/02/11 0411 08/01/11 1317  WBC 5.3 -- 6.2 --  NEUTROABS 3.0 -- -- 6.6  HGB 7.9* 8.1* -- --  HCT 26.0* 26.6* -- --  MCV 83.9 -- 84.2 --  PLT 232 -- 237 --   No results found for this basename: CKTOTAL:3,CKMB:3,CKMBINDEX:3,TROPONINI:3 in the last 72 hours No components found with this basename: POCBNP:3 No results found for this basename: DDIMER:2 in the last 72 hours No results found for this basename: HGBA1C:2 in the last 72 hours No results found for this basename: CHOL:2,HDL:2,LDLCALC:2,TRIG:2,CHOLHDL:2,LDLDIRECT:2 in the last 72 hours No results found for this basename: TSH,T4TOTAL,FREET3,T3FREE,THYROIDAB in the last 72 hours No results found for this basename: VITAMINB12:2,FOLATE:2,FERRITIN:2,TIBC:2,IRON:2,RETICCTPCT:2 in the last 72 hours  Studies/Results: Dg Chest 2 View  08/01/2011  *RADIOLOGY REPORT*  Clinical Data: Syncope.  Nausea and vomiting.  CHEST - 2 VIEW  Comparison: PA and lateral chest  12/09/2010.  Findings: The chest is hyperexpanded but the lungs are clear. Heart size is normal.  Hiatal hernia noted.  IMPRESSION:  1.  No acute finding. 2.  Pulmonary hyperexpansion compatible with emphysema. 3.  Hiatal hernia.  Original Report Authenticated By: Bernadene Bell. Maricela Curet, M.D.   Ct Head Wo Contrast  08/01/2011  *RADIOLOGY REPORT*  Clinical Data: Passed out.  Loss of consciousness.  CT HEAD WITHOUT CONTRAST  Technique:  Contiguous axial images were obtained from the base of the skull through the vertex without contrast.  Comparison: 12/09/2010.  Findings: No skull fracture or intracranial hemorrhage.  Global atrophy.  Ventricular prominence felt to be related to atrophy without hydrocephalus.  Small vessel disease type changes.  No CT evidence of large acute infarct. Remote right lenticular nucleus infarct.  No intracranial mass lesion detected on this unenhanced exam.  Vascular calcifications.  IMPRESSION: No skull fracture or intracranial hemorrhage.  No CT evidence of large acute infarct.  Small vessel disease type changes.  Remote right lenticular nucleus infarct.  Global atrophy without hydrocephalus.  Vascular calcifications.  Original Report Authenticated By: Fuller Canada, M.D.   Medications: Medications administered in the last 24 hours reviewed.  Current Medication List reviewed.   Assessment/Plan: Principal Problem:  *Syncope, likely vasovagal in  the setting of possible GIB - she is doing well. Her Hb is stable but I would like to watch one more day before sending back to ALF.  Active Problems:  HYPOTHYROIDISM  HYPERTENSION  GASTROESOPHAGEAL REFLUX DISEASE - change IV to po Protonix.   Urinary frequency  Melena in the setting of chronic aspirin therapy   LOS: 2 days   Eriverto Byrnes CHARLES 08/03/2011, 9:46 AM

## 2011-08-04 LAB — CBC
HCT: 26.6 % — ABNORMAL LOW (ref 36.0–46.0)
Hemoglobin: 8.1 g/dL — ABNORMAL LOW (ref 12.0–15.0)
MCH: 25.3 pg — ABNORMAL LOW (ref 26.0–34.0)
MCHC: 30.5 g/dL (ref 30.0–36.0)
MCV: 83.1 fL (ref 78.0–100.0)
Platelets: 237 10*3/uL (ref 150–400)
RBC: 3.2 MIL/uL — ABNORMAL LOW (ref 3.87–5.11)
RDW: 13.3 % (ref 11.5–15.5)
WBC: 6.4 10*3/uL (ref 4.0–10.5)

## 2011-08-04 MED ORDER — FERROUS SULFATE 325 (65 FE) MG PO TABS: 325.0000 mg | ORAL_TABLET | Freq: Every day | ORAL | Status: DC

## 2011-08-04 MED ORDER — PANTOPRAZOLE SODIUM 40 MG PO TBEC: 40.0000 mg | DELAYED_RELEASE_TABLET | Freq: Every day | ORAL | Status: DC

## 2011-08-04 NOTE — Discharge Summary (Signed)
Physician Discharge Summary  NAME:[name]  XBJ:[id number]  DOB: 21-May-1919   Admit date: 08/01/2011 Discharge date: 08/04/2011  Admitting Diagnosis: Syncope  Discharge Diagnoses:  Principal Problem:  *Syncope, likely vasovagal in the setting of possible GIB Active Problems:  Acute blood loss anemia secondary to GI bleeding  HYPOTHYROIDISM  HYPERTENSION  GASTROESOPHAGEAL REFLUX DISEASE  Urinary frequency  Melena in the setting of chronic aspirin therapy   Discharge Condition: Improved  Hospital Course: Patient was admitted and followed with serial CBCs. Her hemoglobin, which is usually around 10, dropped to around 8. Her stool was heme positive. Her hemoglobin remained stable throughout the hospitalization and no blood transfusions or needed. Aspirin was discontinued and proton pump inhibitor was started. She ambulated without difficulty and was discharged in improved condition.  Consults: None  Disposition: 01-Home or Self Care (assisted living facility)  Discharge Orders    Future Orders Please Complete By Expires   Diet - low sodium heart healthy      Increase activity slowly        Medication List  As of 08/04/2011  8:43 AM   STOP taking these medications         aspirin 81 MG tablet      feeding supplement Liqd      ibuprofen 200 MG tablet         TAKE these medications         diphenhydrAMINE 25 mg capsule   Commonly known as: BENADRYL   Take 25 mg by mouth at bedtime as needed. Allergies and sleep      enalapril 2.5 MG tablet   Commonly known as: VASOTEC   Take 2.5 mg by mouth daily.      ferrous sulfate 325 (65 FE) MG tablet   Take 1 tablet (325 mg total) by mouth daily with breakfast.      levothyroxine 25 MCG tablet   Commonly known as: SYNTHROID, LEVOTHROID   Take 1 tablet (25 mcg total) by mouth daily.      loratadine 10 MG tablet   Commonly known as: CLARITIN   Take 10 mg by mouth daily as needed. allergies     multivitamin,tx-minerals tablet   Take 1 tablet by mouth daily.      oxybutynin 5 MG 24 hr tablet   Commonly known as: DITROPAN-XL   Take 5 mg by mouth daily.      pantoprazole 40 MG tablet   Commonly known as: PROTONIX   Take 1 tablet (40 mg total) by mouth daily at 12 noon.           Follow-up Information    Follow up with Illene Regulus, MD. (We will call you with an appt in the next 1-2 weeks. )    Contact information:   [address] [place] [address] [phone]          Things to follow up in the outpatient setting: Recheck hemoglobin in 1-2 weeks.  Time coordinating discharge: 20 minutes including medication reconciliation, printing of prescriptions, preparation of discharge papers, and discussion with patient     Signed: Darnelle Bos 08/04/2011, 8:43 AM

## 2011-08-04 NOTE — Progress Notes (Signed)
CSW spoke with facility nurse coordinator and Pt is cleared to return to facility today.  CSW faxed d/c summary and AVS to facility.  CSW notified nurse of d/c plan.   CSW notified family (daughter Josephina Gip [phone]) of Pt acceptance back to facility and d/c plan.  Pt to be transported to facility by daughter at approximately 1 pm.   No further needs at this time.   Leron Croak, LCSWA [employer] [phone]

## 2011-08-06 ENCOUNTER — Telehealth: Payer: Self-pay

## 2011-08-06 NOTE — Telephone Encounter (Signed)
K

## 2011-08-06 NOTE — Telephone Encounter (Signed)
Request verbal order for [facility] to monitor and assess pt regarding recent hospitalization due to blood in her stool.  Caller also request PT because appears weaker since discharge from the hospital.

## 2011-08-07 NOTE — Telephone Encounter (Signed)
LEFT MESSAGE ON VM OF [place]. NURSE AT [facility].  [phone]   OF VERBAL ORDER PER DR. Debby Bud

## 2011-08-09 ENCOUNTER — Telehealth: Payer: Self-pay

## 2011-08-09 NOTE — Telephone Encounter (Signed)
K

## 2011-08-09 NOTE — Telephone Encounter (Signed)
PT notified via secure VM.

## 2011-08-09 NOTE — Telephone Encounter (Signed)
Physical Therapist called requesting verbal consent to continue PT with this pt, please advise.

## 2011-08-19 ENCOUNTER — Encounter: Payer: Self-pay | Admitting: Internal Medicine

## 2011-08-19 ENCOUNTER — Other Ambulatory Visit (INDEPENDENT_AMBULATORY_CARE_PROVIDER_SITE_OTHER): Payer: Medicare Other

## 2011-08-19 ENCOUNTER — Ambulatory Visit (INDEPENDENT_AMBULATORY_CARE_PROVIDER_SITE_OTHER): Payer: Medicare Other | Admitting: Internal Medicine

## 2011-08-19 VITALS — BP 132/60 | HR 62 | Temp 98.6°F | Resp 14 | Wt 108.0 lb

## 2011-08-19 DIAGNOSIS — T887XXA Unspecified adverse effect of drug or medicament, initial encounter: Secondary | ICD-10-CM

## 2011-08-19 DIAGNOSIS — K922 Gastrointestinal hemorrhage, unspecified: Secondary | ICD-10-CM

## 2011-08-19 DIAGNOSIS — I10 Essential (primary) hypertension: Secondary | ICD-10-CM

## 2011-08-19 LAB — HEMOGLOBIN AND HEMATOCRIT, BLOOD: HCT: 30.8 % — ABNORMAL LOW (ref 36.0–46.0)

## 2011-08-19 NOTE — Progress Notes (Signed)
Subjective:    Patient ID: [name], female    DOB: [birth year], 78 y.o.   MRN: [id number]  HPI [name] presents for hospital follow-up. All hospital records reviewed. She had a near syncopal episode at the hairdresser. At the ED she was found to have anemia and heme positive stools. She was admitted July 4-7. She had a nadir of 7.9 Hgb. She was stable during her stay. ASA was stopped and she was discharged home on iron supplement and protonix. She has done well: no complaints of abdominal pain, no report of  Bleeding.  Past Medical History  Diagnosis Date  . Unspecified hypothyroidism   . Disorder of bone and cartilage, unspecified   . Chronic airway obstruction, not elsewhere classified   . Unspecified essential hypertension   . Esophageal reflux   . Other and unspecified hyperlipidemia    Past Surgical History  Procedure Date  . Cataract extractions     bilateral  . Abdominal hysterectomy     total  . Bilateral salpingoopherectomy   . Rectal polyp removal     No malignancy identified   Family History  Problem Relation Age of Onset  . Heart failure Mother   . Cancer Father    History   Social History  . Marital Status: Widowed    Spouse Name: N/A    Number of Children: 2  . Years of Education: N/A   Occupational History  . Homemaker    Social History Main Topics  . Smoking status: Never Smoker   . Smokeless tobacco: Never Used  . Alcohol Use: No  . Drug Use: No  . Sexually Active: Not on file   Other Topics Concern  . 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  Ambulates with walker. End of Life- pt, in the presence of her daughter, states she doesn't want CPR, mechanical ventilation or heroic/ futile measures. Daughter reports some hallucinations and wandering prior to her admission to an ALF.    Current Outpatient Prescriptions on File Prior to Visit  Medication  Sig Dispense Refill  . diphenhydrAMINE (BENADRYL) 25 mg capsule Take 25 mg by mouth at bedtime as needed. Allergies and sleep      . enalapril (VASOTEC) 2.5 MG tablet Take 2.5 mg by mouth daily.      . ferrous sulfate (FERROUSUL) 325 (65 FE) MG tablet Take 1 tablet (325 mg total) by mouth daily with breakfast.  30 tablet  11  . levothyroxine (SYNTHROID, LEVOTHROID) 25 MCG tablet Take 1 tablet (25 mcg total) by mouth daily.  30 tablet  11  . loratadine (CLARITIN) 10 MG tablet Take 10 mg by mouth daily as needed. allergies      . Multiple Vitamins-Minerals (MULTIVITAMIN,TX-MINERALS) tablet Take 1 tablet by mouth daily.        Marland Kitchen omeprazole (PRILOSEC) 40 MG capsule Take 40 mg by mouth daily.      Marland Kitchen oxybutynin (DITROPAN-XL) 5 MG 24 hr tablet Take 5 mg by mouth daily.      . ranitidine (ZANTAC) 150 MG tablet Take 150 mg by mouth at bedtime. One hour after supper          Review of Systems System review is negative for any constitutional, cardiac, pulmonary, GI or neuro symptoms or complaints other than as described in the HPI.     Objective:   Physical Exam Filed Vitals:   08/19/11  1311  BP: 132/60  Pulse: 62  Temp: 98.6 F (37 C)  Resp: 14   gen'l- elderly white woman in no distress HEENT- C&^S clear Neck - supple Cor- 2+ radial pulse, RRR Pulm - normal respirations Abd - BS+ , soft, no guarding or rebound       Assessment & Plan:  Upper GI bleed - seems stable  Plan  Lab today: CBC  Medications: omeprazole 40 mg once a day is good; continue Zantac 150 after supper.  All other medications remain the same.

## 2011-08-20 ENCOUNTER — Telehealth: Payer: Self-pay | Admitting: *Deleted

## 2011-08-20 NOTE — Telephone Encounter (Signed)
Daughter Bonita Quin notified of lab results.

## 2011-08-20 NOTE — Telephone Encounter (Signed)
Message copied by Elnora Morrison on Tue Aug 20, 2011 12:21 PM ------      Message from: Jacques Navy      Created: Tue Aug 20, 2011  9:01 AM       Call pt's daughter. Hgb up nicely to 9.6 grams.

## 2011-08-29 ENCOUNTER — Telehealth: Payer: Self-pay | Admitting: Internal Medicine

## 2011-08-29 DIAGNOSIS — D62 Acute posthemorrhagic anemia: Secondary | ICD-10-CM

## 2011-08-29 DIAGNOSIS — F0390 Unspecified dementia without behavioral disturbance: Secondary | ICD-10-CM

## 2011-08-29 DIAGNOSIS — K922 Gastrointestinal hemorrhage, unspecified: Secondary | ICD-10-CM

## 2011-08-29 DIAGNOSIS — K219 Gastro-esophageal reflux disease without esophagitis: Secondary | ICD-10-CM

## 2011-08-29 NOTE — Telephone Encounter (Signed)
Forward 3 pages from [facility] to Dr. Illene Regulus to sign and faxed back to sender. 08-29-11 ym

## 2011-09-11 ENCOUNTER — Telehealth: Payer: Self-pay | Admitting: Internal Medicine

## 2011-09-11 NOTE — Telephone Encounter (Signed)
The pt's caregiver from the nursing home called the triage line and was hoping to relay information to you regarding the patient's confusion.  She stated [name] was experiencing an increase of confusion today.  Her callback number [phone] (Name Shawna Orleans)  I"ll be happy to schedule her an ov if needed! Thanks!

## 2011-09-11 NOTE — Telephone Encounter (Signed)
See if we can call in an order for a u/a to her facility. If not - OV for assessment.

## 2011-09-12 ENCOUNTER — Telehealth: Payer: Self-pay | Admitting: Internal Medicine

## 2011-09-12 NOTE — Telephone Encounter (Signed)
Spoke with melanie concerning order for patient. Verbal order given per Dr, Debby Bud for UA to be done at facility .

## 2011-09-12 NOTE — Telephone Encounter (Signed)
Deanna Gower, RN at the residence calling.  She appeared to have a vagal response and then vomited.  She is alert and oriented.  Her b/p is 100/60, heart rate 65 and this is her baseline.  No LOC.  She appeared to be awake but not responding to voice just before vomiting.  Once she vomited she is back to her normal self.   Yesterday, she was slightly confused and they requested an order for a u/a.  Got an order today for a clean catch urinalysis Order also given for in and out cath if she is unable to get the clean catch due to her incontinence.   Triaged per [place] and Syncope.  Home care given.

## 2011-09-20 ENCOUNTER — Telehealth: Payer: Self-pay | Admitting: *Deleted

## 2011-09-20 NOTE — Telephone Encounter (Signed)
Faxed order to [facility] for patient to start medication Cipro 250mg  po Bid x 7 days

## 2011-09-25 ENCOUNTER — Telehealth: Payer: Self-pay | Admitting: Internal Medicine

## 2011-09-25 DIAGNOSIS — R350 Frequency of micturition: Secondary | ICD-10-CM

## 2011-09-25 NOTE — Telephone Encounter (Signed)
The nurse at [facility] called Bonita Quin to say she is confused.  She just finished antibiotics Friday for a uti.  Should she come in or send a directive to [facility] for a urine sample. [phone]) The fax number is [phone] to [facility].  Linda's cell is [phone]

## 2011-09-25 NOTE — Telephone Encounter (Signed)
Ok to have a u/a done at the facility

## 2011-09-26 NOTE — Telephone Encounter (Signed)
Requisition refaxed to [phone]. Conley Canal at facility states that fax number given by pt's daughter is incorrect.

## 2011-09-26 NOTE — Telephone Encounter (Signed)
Order placed, requisition printed and faxed to facility, Bonita Quin advised of same.

## 2011-10-11 ENCOUNTER — Encounter: Payer: Self-pay | Admitting: Internal Medicine

## 2011-10-15 ENCOUNTER — Ambulatory Visit (INDEPENDENT_AMBULATORY_CARE_PROVIDER_SITE_OTHER): Payer: Medicare Other | Admitting: Internal Medicine

## 2011-10-15 ENCOUNTER — Encounter: Payer: Self-pay | Admitting: Internal Medicine

## 2011-10-15 VITALS — BP 168/70 | HR 69 | Temp 98.2°F | Resp 14 | Wt 111.0 lb

## 2011-10-15 DIAGNOSIS — N390 Urinary tract infection, site not specified: Secondary | ICD-10-CM

## 2011-10-15 DIAGNOSIS — F068 Other specified mental disorders due to known physiological condition: Secondary | ICD-10-CM

## 2011-10-15 DIAGNOSIS — F0281 Dementia in other diseases classified elsewhere with behavioral disturbance: Secondary | ICD-10-CM

## 2011-10-15 DIAGNOSIS — I10 Essential (primary) hypertension: Secondary | ICD-10-CM

## 2011-10-15 DIAGNOSIS — R350 Frequency of micturition: Secondary | ICD-10-CM

## 2011-10-15 DIAGNOSIS — G309 Alzheimer's disease, unspecified: Secondary | ICD-10-CM

## 2011-10-15 LAB — POCT URINALYSIS DIPSTICK
Bilirubin, UA: NEGATIVE
Glucose, UA: NEGATIVE
Ketones, UA: NEGATIVE
Leukocytes, UA: NEGATIVE
Nitrite, UA: NEGATIVE
pH, UA: 5

## 2011-10-15 NOTE — Assessment & Plan Note (Signed)
No evidence of UTI at today's visit. Previous two U/A's were negative, despite a postive culture.  Plan - no antibiotics  DC oxybutynin as potential agent causing confusion  VOIDING SCHEDULE - Q 2 HRS WHILE AWAKE.

## 2011-10-15 NOTE — Progress Notes (Signed)
Subjective:     Patient ID: [name], female   DOB: 10-30-1919, 78 y.o.   MRN: [id number]  HPI Comments: [name] is a 78 yo female who lives at a SNF with a history of alzheimer's type dementia and recurrent UTIs who was brought here by her daughter today for evaluation of a possible UTI with confusion. Patient's daughter reports that she first noticed signs of UTI 1 week ago and the nursing home told her that confusion in this patient usually indicates a UTI is present.  Patient had AMS, was not oriented to time, and occasionally responded inappropriately to questions. Patient reports that she is experiencing urinary incontinence. She states that urine "gushes" out of her and she cannot feel it. Patient reports no abdominal pain.  Past Medical History  Diagnosis Date  . Unspecified hypothyroidism   . Disorder of bone and cartilage, unspecified   . Chronic airway obstruction, not elsewhere classified   . Unspecified essential hypertension   . Esophageal reflux   . Other and unspecified hyperlipidemia    Past Surgical History  Procedure Date  . Cataract extractions     bilateral  . Abdominal hysterectomy     total  . Bilateral salpingoopherectomy   . Rectal polyp removal     No malignancy identified   Family History  Problem Relation Age of Onset  . Heart failure Mother   . Cancer Father    History   Social History  . Marital Status: Widowed    Spouse Name: N/A    Number of Children: 2  . Years of Education: N/A   Occupational History  . Homemaker    Social History Main Topics  . Smoking status: Never Smoker   . Smokeless tobacco: Never Used  . Alcohol Use: No  . Drug Use: No  . Sexually Active: Not on file   Other Topics Concern  . 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  Ambulates with walker. End of Life- pt, in the presence of her daughter, states she doesn't want  CPR, mechanical ventilation or heroic/ futile measures. Daughter reports some hallucinations and wandering prior to her admission to an ALF.    Current Outpatient Prescriptions on File Prior to Visit  Medication Sig Dispense Refill  . ferrous sulfate (FERROUSUL) 325 (65 FE) MG tablet Take 1 tablet (325 mg total) by mouth daily with breakfast.  30 tablet  11  . levothyroxine (SYNTHROID, LEVOTHROID) 25 MCG tablet Take 1 tablet (25 mcg total) by mouth daily.  30 tablet  11  . omeprazole (PRILOSEC) 40 MG capsule Take 40 mg by mouth daily.      Marland Kitchen oxybutynin (DITROPAN-XL) 5 MG 24 hr tablet Take 5 mg by mouth daily.      . ranitidine (ZANTAC) 150 MG tablet Take 150 mg by mouth at bedtime. One hour after supper      . diphenhydrAMINE (BENADRYL) 25 mg capsule Take 25 mg by mouth at bedtime as needed. Allergies and sleep      . enalapril (VASOTEC) 2.5 MG tablet Take 2.5 mg by mouth daily.      Marland Kitchen loratadine (CLARITIN) 10 MG tablet Take 10 mg by mouth daily as needed. allergies      . Multiple Vitamins-Minerals (MULTIVITAMIN,TX-MINERALS) tablet Take 1 tablet by mouth daily.           Review of Systems  All other systems  reviewed and are negative.       Objective:   Physical Exam  Constitutional: No distress.  Cardiovascular: Normal rate, regular rhythm, normal heart sounds and intact distal pulses.  Exam reveals no gallop and no friction rub.   No murmur heard. Pulmonary/Chest: Effort normal and breath sounds normal. No respiratory distress. She has no wheezes. She has no rales.  Abdominal: Soft. Bowel sounds are normal. She exhibits no distension and no mass. There is tenderness (tenderness to deep palpation in LLQ.). There is no guarding.  Neurological: She is alert.       Not oriented to time.  Skin: She is not diaphoretic.   Filed Vitals:   10/15/11 1611  BP: 168/70  Pulse: 69  Temp: 98.2 F (36.8 C)  Resp: 14        Assessment & Plan:     Attending note: patient seen and  examined. See problem oriented charting. Agree with Mr. Areta Haber MSIII, assessment and plan.

## 2011-10-15 NOTE — Patient Instructions (Addendum)
1. Confusion and Urinary Incontinence - The oxybutynin you are taking may be aggravating these symptoms. We do not think this is a urinary tract infection given the negative uranalysis and

## 2011-10-15 NOTE — Assessment & Plan Note (Signed)
BP Readings from Last 3 Encounters:  10/15/11 168/70  08/19/11 132/60  08/04/11 138/71   Generally well controlled. No symptoms at today's visit  Plan - continue present regimen.

## 2011-10-15 NOTE — Assessment & Plan Note (Signed)
Possible exacerbation by medication - oxybutynin.  Plan - d/C oxybutynin.

## 2011-10-18 ENCOUNTER — Other Ambulatory Visit: Payer: Self-pay | Admitting: Internal Medicine

## 2011-10-18 DIAGNOSIS — E039 Hypothyroidism, unspecified: Secondary | ICD-10-CM

## 2011-10-19 NOTE — Progress Notes (Deleted)
  Subjective:    Patient ID: [name], female    DOB: October 27, 1919, 78 y.o.   MRN: [id number]  HPI    Review of Systems     Objective:   Physical Exam        Assessment & Plan:

## 2011-10-24 ENCOUNTER — Other Ambulatory Visit: Payer: Self-pay | Admitting: *Deleted

## 2011-10-24 DIAGNOSIS — T39395A Adverse effect of other nonsteroidal anti-inflammatory drugs [NSAID], initial encounter: Secondary | ICD-10-CM

## 2011-10-24 DIAGNOSIS — K922 Gastrointestinal hemorrhage, unspecified: Secondary | ICD-10-CM

## 2011-10-24 MED ORDER — OMEPRAZOLE 40 MG PO CPDR: 40.0000 mg | DELAYED_RELEASE_CAPSULE | Freq: Every day | ORAL | Status: DC

## 2011-10-28 ENCOUNTER — Other Ambulatory Visit: Payer: Self-pay | Admitting: Internal Medicine

## 2011-10-28 DIAGNOSIS — F0281 Dementia in other diseases classified elsewhere with behavioral disturbance: Secondary | ICD-10-CM

## 2011-11-05 DIAGNOSIS — M6281 Muscle weakness (generalized): Secondary | ICD-10-CM

## 2011-11-05 DIAGNOSIS — R269 Unspecified abnormalities of gait and mobility: Secondary | ICD-10-CM

## 2011-11-05 DIAGNOSIS — F0391 Unspecified dementia with behavioral disturbance: Secondary | ICD-10-CM

## 2011-11-05 DIAGNOSIS — I10 Essential (primary) hypertension: Secondary | ICD-10-CM

## 2011-11-11 ENCOUNTER — Telehealth: Payer: Self-pay | Admitting: *Deleted

## 2011-11-11 NOTE — Telephone Encounter (Signed)
Medication for UTI called to [facility] and given to staff PT who will give to nurse to let her get started on this.

## 2011-11-15 ENCOUNTER — Encounter: Payer: Self-pay | Admitting: Internal Medicine

## 2011-12-30 ENCOUNTER — Telehealth: Payer: Self-pay

## 2011-12-30 NOTE — Telephone Encounter (Signed)
[facility] called requesting an order for pt to receive flu vaccination tomorrow 12/03. Fax # [phone]

## 2011-12-30 NOTE — Telephone Encounter (Signed)
Written order on side A counter

## 2011-12-31 ENCOUNTER — Telehealth: Payer: Self-pay | Admitting: *Deleted

## 2011-12-31 ENCOUNTER — Emergency Department (HOSPITAL_COMMUNITY): Payer: Medicare Other

## 2011-12-31 ENCOUNTER — Observation Stay (HOSPITAL_COMMUNITY)
Admission: EM | Admit: 2011-12-31 | Discharge: 2012-01-02 | Payer: Medicare Other | Attending: Internal Medicine | Admitting: Internal Medicine

## 2011-12-31 ENCOUNTER — Encounter (HOSPITAL_COMMUNITY): Payer: Self-pay | Admitting: *Deleted

## 2011-12-31 DIAGNOSIS — K219 Gastro-esophageal reflux disease without esophagitis: Secondary | ICD-10-CM | POA: Insufficient documentation

## 2011-12-31 DIAGNOSIS — S32009A Unspecified fracture of unspecified lumbar vertebra, initial encounter for closed fracture: Secondary | ICD-10-CM | POA: Insufficient documentation

## 2011-12-31 DIAGNOSIS — X58XXXA Exposure to other specified factors, initial encounter: Secondary | ICD-10-CM | POA: Insufficient documentation

## 2011-12-31 DIAGNOSIS — R112 Nausea with vomiting, unspecified: Secondary | ICD-10-CM | POA: Insufficient documentation

## 2011-12-31 DIAGNOSIS — E039 Hypothyroidism, unspecified: Secondary | ICD-10-CM | POA: Insufficient documentation

## 2011-12-31 DIAGNOSIS — R531 Weakness: Secondary | ICD-10-CM

## 2011-12-31 DIAGNOSIS — R5381 Other malaise: Secondary | ICD-10-CM | POA: Insufficient documentation

## 2011-12-31 DIAGNOSIS — Z79899 Other long term (current) drug therapy: Secondary | ICD-10-CM | POA: Insufficient documentation

## 2011-12-31 DIAGNOSIS — F02818 Dementia in other diseases classified elsewhere, unspecified severity, with other behavioral disturbance: Secondary | ICD-10-CM | POA: Diagnosis present

## 2011-12-31 DIAGNOSIS — R197 Diarrhea, unspecified: Secondary | ICD-10-CM | POA: Diagnosis present

## 2011-12-31 DIAGNOSIS — F0281 Dementia in other diseases classified elsewhere with behavioral disturbance: Secondary | ICD-10-CM

## 2011-12-31 DIAGNOSIS — G319 Degenerative disease of nervous system, unspecified: Secondary | ICD-10-CM | POA: Insufficient documentation

## 2011-12-31 DIAGNOSIS — Z8673 Personal history of transient ischemic attack (TIA), and cerebral infarction without residual deficits: Secondary | ICD-10-CM | POA: Insufficient documentation

## 2011-12-31 DIAGNOSIS — T39395A Adverse effect of other nonsteroidal anti-inflammatory drugs [NSAID], initial encounter: Secondary | ICD-10-CM

## 2011-12-31 DIAGNOSIS — E785 Hyperlipidemia, unspecified: Secondary | ICD-10-CM | POA: Insufficient documentation

## 2011-12-31 DIAGNOSIS — I10 Essential (primary) hypertension: Secondary | ICD-10-CM | POA: Diagnosis present

## 2011-12-31 DIAGNOSIS — R55 Syncope and collapse: Principal | ICD-10-CM | POA: Diagnosis present

## 2011-12-31 LAB — CBC WITH DIFFERENTIAL/PLATELET
Basophils Relative: 0 % (ref 0–1)
HCT: 35.8 % — ABNORMAL LOW (ref 36.0–46.0)
Hemoglobin: 11.9 g/dL — ABNORMAL LOW (ref 12.0–15.0)
Lymphocytes Relative: 15 % (ref 12–46)
Lymphs Abs: 1.4 10*3/uL (ref 0.7–4.0)
Monocytes Absolute: 0.7 10*3/uL (ref 0.1–1.0)
Monocytes Relative: 8 % (ref 3–12)
Neutro Abs: 7.3 10*3/uL (ref 1.7–7.7)
Neutrophils Relative %: 76 % (ref 43–77)
RBC: 3.86 MIL/uL — ABNORMAL LOW (ref 3.87–5.11)
WBC: 9.6 10*3/uL (ref 4.0–10.5)

## 2011-12-31 LAB — TROPONIN I
Troponin I: <0.3 ng/mL (ref <0.30)
Troponin I: <0.3 ng/mL (ref <0.30)

## 2011-12-31 LAB — COMPREHENSIVE METABOLIC PANEL
Albumin: 3.6 g/dL (ref 3.5–5.2)
Alkaline Phosphatase: 123 U/L — ABNORMAL HIGH (ref 39–117)
BUN: 22 mg/dL (ref 6–23)
CO2: 21 mEq/L (ref 19–32)
Chloride: 101 mEq/L (ref 96–112)
Creatinine, Ser: 1.05 mg/dL (ref 0.50–1.10)
GFR calc non Af Amer: 45 mL/min — ABNORMAL LOW (ref >90)
Potassium: 3.8 mEq/L (ref 3.5–5.1)
Total Bilirubin: 0.2 mg/dL — ABNORMAL LOW (ref 0.3–1.2)

## 2011-12-31 LAB — URINALYSIS, ROUTINE W REFLEX MICROSCOPIC
Glucose, UA: NEGATIVE mg/dL
Ketones, ur: NEGATIVE mg/dL
Leukocytes, UA: NEGATIVE
Nitrite: NEGATIVE
Protein, ur: NEGATIVE mg/dL
Urobilinogen, UA: 1 mg/dL (ref 0.0–1.0)

## 2011-12-31 LAB — LIPASE, BLOOD: Lipase: 36 U/L (ref 11–59)

## 2011-12-31 LAB — MRSA PCR SCREENING: MRSA by PCR: NEGATIVE

## 2011-12-31 MED ORDER — SODIUM CHLORIDE 0.9 % IJ SOLN
3.0000 mL | Freq: Two times a day (BID) | INTRAMUSCULAR | Status: DC
  Administered 2012-01-01 (×2): 3 mL via INTRAVENOUS

## 2011-12-31 MED ORDER — ONDANSETRON HCL 4 MG/2ML IJ SOLN
4.0000 mg | Freq: Once | INTRAMUSCULAR | Status: AC
  Administered 2011-12-31: 4 mg via INTRAVENOUS
  Filled 2011-12-31: qty 2

## 2011-12-31 MED ORDER — ONDANSETRON HCL 4 MG PO TABS: 4.0000 mg | ORAL_TABLET | Freq: Four times a day (QID) | ORAL | Status: DC | PRN

## 2011-12-31 MED ORDER — DIPHENHYDRAMINE HCL 25 MG PO CAPS: 25.0000 mg | ORAL_CAPSULE | Freq: Every evening | ORAL | Status: DC | PRN

## 2011-12-31 MED ORDER — SODIUM CHLORIDE 0.9 % IV SOLN
INTRAVENOUS | Status: AC
  Administered 2011-12-31: 23:00:00 via INTRAVENOUS

## 2011-12-31 MED ORDER — ACETAMINOPHEN 650 MG RE SUPP: 650.0000 mg | Freq: Four times a day (QID) | RECTAL | Status: DC | PRN

## 2011-12-31 MED ORDER — ENALAPRIL MALEATE 2.5 MG PO TABS
2.5000 mg | ORAL_TABLET | Freq: Every day | ORAL | Status: DC
  Administered 2012-01-01 – 2012-01-02 (×2): 2.5 mg via ORAL
  Filled 2011-12-31 (×2): qty 1

## 2011-12-31 MED ORDER — LEVOTHYROXINE SODIUM 25 MCG PO TABS
25.0000 ug | ORAL_TABLET | Freq: Every day | ORAL | Status: DC
  Administered 2012-01-01 – 2012-01-02 (×2): 25 ug via ORAL
  Filled 2011-12-31 (×4): qty 1

## 2011-12-31 MED ORDER — ACETAMINOPHEN 325 MG PO TABS: 650.0000 mg | ORAL_TABLET | Freq: Four times a day (QID) | ORAL | Status: DC | PRN

## 2011-12-31 MED ORDER — FERROUS SULFATE 325 (65 FE) MG PO TABS
325.0000 mg | ORAL_TABLET | Freq: Every day | ORAL | Status: DC
  Administered 2012-01-01 – 2012-01-02 (×2): 325 mg via ORAL
  Filled 2011-12-31 (×3): qty 1

## 2011-12-31 MED ORDER — SODIUM CHLORIDE 0.9 % IV SOLN: INTRAVENOUS | Status: DC

## 2011-12-31 MED ORDER — ONDANSETRON HCL 4 MG/2ML IJ SOLN: 4.0000 mg | Freq: Three times a day (TID) | INTRAMUSCULAR | Status: DC | PRN

## 2011-12-31 MED ORDER — FAMOTIDINE 20 MG PO TABS
20.0000 mg | ORAL_TABLET | Freq: Every day | ORAL | Status: DC
  Administered 2012-01-01 – 2012-01-02 (×2): 20 mg via ORAL
  Filled 2011-12-31 (×2): qty 1

## 2011-12-31 MED ORDER — PANTOPRAZOLE SODIUM 40 MG PO TBEC
40.0000 mg | DELAYED_RELEASE_TABLET | Freq: Every day | ORAL | Status: DC
  Administered 2012-01-01 – 2012-01-02 (×2): 40 mg via ORAL
  Filled 2011-12-31 (×2): qty 1

## 2011-12-31 MED ORDER — MIRTAZAPINE 7.5 MG PO TABS
7.5000 mg | ORAL_TABLET | Freq: Every day | ORAL | Status: DC
  Administered 2011-12-31 – 2012-01-01 (×2): 7.5 mg via ORAL
  Filled 2011-12-31 (×3): qty 1

## 2011-12-31 MED ORDER — ONDANSETRON HCL 4 MG/2ML IJ SOLN: 4.0000 mg | Freq: Four times a day (QID) | INTRAMUSCULAR | Status: DC | PRN

## 2011-12-31 MED ORDER — DIPHENHYDRAMINE HCL (SLEEP) 25 MG PO TABS: 25.0000 mg | ORAL_TABLET | Freq: Every evening | ORAL | Status: DC | PRN

## 2011-12-31 MED ORDER — SODIUM CHLORIDE 0.9 % IV SOLN
INTRAVENOUS | Status: DC
  Administered 2011-12-31: 18:00:00 via INTRAVENOUS

## 2011-12-31 NOTE — Telephone Encounter (Signed)
Faxed order for flu vacc to [facility] # [phone] for pt.

## 2011-12-31 NOTE — ED Notes (Signed)
Per ems: pt from [facility], reports pt had hair washed/dried, dryer was hot - shortly after pt eating lunch, began vomiting. Staff believes pt aspirated, told ems rales/ronchi heard throughout lung fields. Pt vomiting on ems arrival. Clear lung fields per ems. 22 gauge in R hand, NS and 4mg  zofran given en route. bp 118 palpated, pulse 88, respirations 24 even/unlabored. Pt DNR

## 2011-12-31 NOTE — ED Notes (Addendum)
Family leaving - call with updates Raiford Noble [phone]) or [phone]

## 2011-12-31 NOTE — Clinical Social Work Psychosocial (Signed)
     Clinical Social Work Department BRIEF PSYCHOSOCIAL ASSESSMENT 12/31/2011  Patient:  [name], [name]     Account Number:  [id number]     Admit date:  12/31/2011  Clinical Social Worker:  Doree Albee  Date/Time:  12/31/2011 09:17 PM  Referred by:  RN  Date Referred:  12/31/2011 Referred for  ALF Placement   Other Referral:   Interview type:  Family Other interview type:   patient daughter    PSYCHOSOCIAL DATA Living Status:  FACILITY Admitted from facility:   [facility] Level of care:  Assisted Living Primary support name:  Josephina Gip Primary support relationship to patient:  CHILD, ADULT Degree of support available:   strong  as well as pt son Arlys John    CURRENT CONCERNS Current Concerns  Post-Acute Placement   Other Concerns:    SOCIAL WORK ASSESSMENT / PLAN CSW attempted to meet with pt however pt was being taken to radiology. Pt has dementia and per pt daughter pt has been more confused. Pt daughter confirmed that patient is a resident at [facility].    Per chart review, pt son has also been at bedside and would like for patient to return to [facility] when medically stale.    Pt daughter stated that she hopes patient can return to [facility] however pt daughter is concerned regarding patient weakness.    CSW spoke with pt MD, who stated that patient will be admitted under observation.    Unit CSW can follow up with patient and patient family regarding pt needs at discharge.   Assessment/plan status:  Psychosocial Support/Ongoing Assessment of Needs Other assessment/ plan:   discharge plannin   Information/referral to community resources:   CSW provided pt daughter with list of skilled nursing facilities for furture resources if needed    PATIENTS/FAMILYS RESPONSE TO PLAN OF CARE: Pt daughter thanked csw for concern and support. CSW attempted to assess patient at later time however she was  sleeping and pt son had just stepped out.

## 2011-12-31 NOTE — ED Notes (Signed)
UJW:[id number]<YN> Expected date:<BR> Expected time:<BR> Means of arrival:<BR> Comments:<BR> 78yo n/v

## 2011-12-31 NOTE — ED Provider Notes (Signed)
History     CSN: [id number]  Arrival date & time 12/31/11  1232   First MD Initiated Contact with Patient 12/31/11 1545      Chief Complaint  Patient presents with  . Emesis  . Loss of Consciousness     The history is provided by the nursing home, the EMS personnel and a relative. History Limited By: Hx dementia.  Pt was seen at 1600.  Per EMS, NH report and family, pt with sudden onset and resolution of one brief syncopal episode while under the hair dryer getting her hair done at the [facility].  This was followed by several episodes of profuse N/V and several episodes of stooling (not diarrhea).  Assisted Living staff concerned regarding possible aspiration during vomiting episode. Family states pt has hx of same, but she appears more fatigued than usual.      Past Medical History  Diagnosis Date  . Unspecified hypothyroidism   . Disorder of bone and cartilage, unspecified   . Chronic airway obstruction, not elsewhere classified   . Unspecified essential hypertension   . Esophageal reflux   . Other and unspecified hyperlipidemia     Past Surgical History  Procedure Date  . Cataract extractions     bilateral  . Abdominal hysterectomy     total  . Bilateral salpingoopherectomy   . Rectal polyp removal     No malignancy identified    Family History  Problem Relation Age of Onset  . Heart failure Mother   . Cancer Father     History  Substance Use Topics  . Smoking status: Never Smoker   . Smokeless tobacco: Never Used  . Alcohol Use: No      Review of Systems  Unable to perform ROS: Dementia    Allergies  Review of patient's allergies indicates no known allergies.  Home Medications   Current Outpatient Rx  Name  Route  Sig  Dispense  Refill  . CRANBERRY 500 MG PO CAPS   Oral   Take 500 mg by mouth daily.         Marland Kitchen DIPHENHYDRAMINE HCL 25 MG PO CAPS   Oral   Take 25 mg by mouth at bedtime as needed. Allergies and sleep         . DIPHENHYDRAMINE  HCL (SLEEP) 25 MG PO TABS   Oral   Take 25 mg by mouth at bedtime as needed.         . ENALAPRIL MALEATE 2.5 MG PO TABS   Oral   Take 2.5 mg by mouth daily.         Marland Kitchen FERROUS SULFATE 325 (65 FE) MG PO TABS   Oral   Take 1 tablet (325 mg total) by mouth daily with breakfast.   30 tablet   11   . LEVOTHYROXINE SODIUM 25 MCG PO TABS   Oral   Take 1 tablet (25 mcg total) by mouth daily.   30 tablet   11   . LORATADINE 10 MG PO TABS   Oral   Take 10 mg by mouth daily as needed. allergies         . MIRTAZAPINE 15 MG PO TABS   Oral   Take 7.5 mg by mouth at bedtime.         . SUPER HIGH VITAMINS/MINERALS PO TABS   Oral   Take 1 tablet by mouth daily.           Marland Kitchen OMEPRAZOLE 20 MG PO  CPDR   Oral   Take 40 mg by mouth daily.         Marland Kitchen RANITIDINE HCL 150 MG PO TABS   Oral   Take 150 mg by mouth at bedtime. One hour after supper         . SULFAMETHOXAZOLE-TRIMETHOPRIM 400-80 MG PO TABS   Oral   Take 1 tablet by mouth daily.           BP 126/50  Pulse 95  Temp 98 F (36.7 C) (Oral)  Resp 20  SpO2 98%  Physical Exam 1605: Physical examination:  Nursing notes reviewed; Vital signs and O2 SAT reviewed;  Constitutional: Well developed, Well nourished, In no acute distress; Head:  Normocephalic, atraumatic; Eyes: EOMI, PERRL, No scleral icterus; ENMT: Mouth and pharynx normal, Mucous membranes dry; Neck: Supple, Full range of motion, No lymphadenopathy; Cardiovascular: Regular rate and rhythm, No murmur, rub, or gallop; Respiratory: Breath sounds coarse & equal bilaterally, No wheezes.  Speaking full sentences with ease, Normal respiratory effort/excursion; Chest: Nontender, Movement normal; Abdomen: Soft, Nontender, Nondistended, Normal bowel sounds; Genitourinary: No CVA tenderness; Extremities: Pulses normal, No tenderness, No edema, No calf edema or asymmetry.; Neuro: Awake, alert, confused re: time, place, events per hx dementia. Major CN grossly intact.  No  facial droop. Speech clear. Moves all ext on stretcher without apparent gross focal motor deficits.; Skin: Color normal, Warm, Dry.   ED Course  Procedures    MDM  MDM Reviewed: nursing note, previous chart and vitals Reviewed previous: labs and ECG Interpretation: labs, ECG, x-ray and CT scan    Date: 12/31/2011  Rate: 84  Rhythm: normal sinus rhythm  QRS Axis: normal  Intervals: normal  ST/T Wave abnormalities: normal  Conduction Disutrbances:none  Narrative Interpretation:   Old EKG Reviewed: unchanged; no significant changes from previous EKG dated 08/01/2011.  Results for orders placed during the hospital encounter of 12/31/11  CBC WITH DIFFERENTIAL      Component Value Range   WBC 9.6  4.0 - 10.5 K/uL   RBC 3.86 (*) 3.87 - 5.11 MIL/uL   Hemoglobin 11.9 (*) 12.0 - 15.0 g/dL   HCT 40.9 (*) 81.1 - 91.4 %   MCV 92.7  78.0 - 100.0 fL   MCH 30.8  26.0 - 34.0 pg   MCHC 33.2  30.0 - 36.0 g/dL   RDW 78.2  95.6 - 21.3 %   Platelets 213  150 - 400 K/uL   Neutrophils Relative 76  43 - 77 %   Neutro Abs 7.3  1.7 - 7.7 K/uL   Lymphocytes Relative 15  12 - 46 %   Lymphs Abs 1.4  0.7 - 4.0 K/uL   Monocytes Relative 8  3 - 12 %   Monocytes Absolute 0.7  0.1 - 1.0 K/uL   Eosinophils Relative 2  0 - 5 %   Eosinophils Absolute 0.1  0.0 - 0.7 K/uL   Basophils Relative 0  0 - 1 %   Basophils Absolute 0.0  0.0 - 0.1 K/uL  COMPREHENSIVE METABOLIC PANEL      Component Value Range   Sodium 135  135 - 145 mEq/L   Potassium 3.8  3.5 - 5.1 mEq/L   Chloride 101  96 - 112 mEq/L   CO2 21  19 - 32 mEq/L   Glucose, Bld 118 (*) 70 - 99 mg/dL   BUN 22  6 - 23 mg/dL   Creatinine, Ser 0.86  0.50 - 1.10 mg/dL  Calcium 9.0  8.4 - 10.5 mg/dL   Total Protein 6.5  6.0 - 8.3 g/dL   Albumin 3.6  3.5 - 5.2 g/dL   AST 30  0 - 37 U/L   ALT 22  0 - 35 U/L   Alkaline Phosphatase 123 (*) 39 - 117 U/L   Total Bilirubin 0.2 (*) 0.3 - 1.2 mg/dL   GFR calc non Af Amer 45 (*) >90 mL/min   GFR calc Af  Amer 53 (*) >90 mL/min  LIPASE, BLOOD      Component Value Range   Lipase 36  11 - 59 U/L  TROPONIN I      Component Value Range   Troponin I <0.30  <0.30 ng/mL  URINALYSIS, ROUTINE W REFLEX MICROSCOPIC      Component Value Range   Color, Urine YELLOW  YELLOW   APPearance CLEAR  CLEAR   Specific Gravity, Urine 1.015  1.005 - 1.030   pH 5.5  5.0 - 8.0   Glucose, UA NEGATIVE  NEGATIVE mg/dL   Hgb urine dipstick NEGATIVE  NEGATIVE   Bilirubin Urine NEGATIVE  NEGATIVE   Ketones, ur NEGATIVE  NEGATIVE mg/dL   Protein, ur NEGATIVE  NEGATIVE mg/dL   Urobilinogen, UA 1.0  0.0 - 1.0 mg/dL   Nitrite NEGATIVE  NEGATIVE   Leukocytes, UA NEGATIVE  NEGATIVE   Ct Head Wo Contrast 12/31/2011  *RADIOLOGY REPORT*  Clinical Data: Syncope.  CT HEAD WITHOUT CONTRAST  Technique:  Contiguous axial images were obtained from the base of the skull through the vertex without contrast.  Comparison: 08/01/2011  Findings: There is no evidence of intracranial hemorrhage, brain edema or other signs of acute infarction.  There is no evidence of intracranial mass lesion or mass effect.  No abnormal extra-axial fluid collections are identified.  Mild cerebral atrophy is again demonstrated.  Extensive chronic small vessel disease is also unchanged.  Old right basal ganglia and deep white matter lacunar infarct again demonstrated. Ventricles are stable in size.  No skull fracture or other significant bone abnormality identified.  IMPRESSION:  1.   No acute intracranial findings. 2.  Stable cerebral atrophy, chronic small vessel disease, and old lacunar infarct.   Original Report Authenticated By: Myles Rosenthal, M.D.    Dg Abd Acute W/chest 12/31/2011  *RADIOLOGY REPORT*  Clinical Data: Abdominal pain, weakness, nausea, vomiting  ACUTE ABDOMEN SERIES (ABDOMEN 2 VIEW & CHEST 1 VIEW)  Comparison: Chest radiographs 08/01/2011  Findings: Borderline enlargement of cardiac silhouette. Tortuous aorta. Pulmonary vascularity normal.  Emphysematous and bronchitic changes consistent with COPD. No acute infiltrate, pleural effusion, or pneumothorax. Diffuse osseous demineralization. Increased stool in rectum. Nonobstructive bowel gas pattern without bowel dilatation or bowel wall thickening. No free intraperitoneal air or urinary tract calcification. Superior endplate compression deformity of L2 vertebral body on AP view, uncertain acuity.  IMPRESSION: Increased stool in rectum. Question COPD. Age indeterminate superior endplate compression deformity of L2 vertebral body.   Original Report Authenticated By: Ulyses Southward, M.D.      2020:  Pt ambulated with walker.  Family observing states pt appeared globally "weaker" than her normal.  Pt has been having multiple BM's while in the ED; stool collected and sent for C&S, heme and cdiff.  No vomiting while in ED, but continues to c/o nausea.  Family states pt lives at assisted living and are very concerned about her returning to the facility walking "weaker" than normal as well as having multiple stools while in the ED.  Pt does not appear safe at this time to return to assisted living environment.  Will admit.  T/C to [facility] Dr. Toniann Fail, case discussed, including:  HPI, pertinent PM/SHx, VS/PE, dx testing, ED course and treatment:  Agreeable to observation admit, requests to obtain tele bed to Dr. Arthur Holms.           Laray Anger, DO 01/02/12 1420

## 2011-12-31 NOTE — Progress Notes (Signed)
CSW met with pt daughter as pt is currently in procedure. Pt daughter confirmed that patient is a resident at [facility]. Pt daughter asked CSW if patient would need higher level of care. CSW explained that futher evaluation by MD would help determine pt disposition needs. CSW provided pt daughter with skilled nursing facility list if needed as a future resource.  CSW and pt daughter also discussed home health and private duty care to assist with pt care in order to help patient remain at [facility] place and patient needed more assistance upon discharge. CSW continuing to follow to assist with pt disposition needs.   Catha Gosselin, LCSWA  [phone] .12/31/2011 1657pm

## 2011-12-31 NOTE — H&P (Signed)
[name] is an 78 y.o. female.   Patient was seen and examined on December 31, 2011. PCP - Dr. Oliver Barre. Chief Complaint: Loss of consciousness. HPI: 78 year-old female who lives in assisted-living facility was brought to the ER patient had a syncopal episode while having her hair done. Patient's son who provided most of the history states that is almost the third syncopal episode over the last one year. Patient has had these episodes in which he gets into warm environment.Just prior to the syncopal episode patient had nausea and vomiting. Patient did throw up once more on the way to the hospital. Patient also has been having diarrhea for last 2 days. Patient is on antibiotics for UTI. CTA did not show any acute. Acute abdominal series is unremarkable. At this time patient has been admitted for further observation. Patient has dementia but is oriented to her name and follows commands. Denies any chest pain or shortness of breath. Abdomen on exam is benign appearing.  Past Medical History  Diagnosis Date  . Unspecified hypothyroidism   . Disorder of bone and cartilage, unspecified   . Chronic airway obstruction, not elsewhere classified   . Unspecified essential hypertension   . Esophageal reflux   . Other and unspecified hyperlipidemia     Past Surgical History  Procedure Date  . Cataract extractions     bilateral  . Abdominal hysterectomy     total  . Bilateral salpingoopherectomy   . Rectal polyp removal     No malignancy identified    Family History  Problem Relation Age of Onset  . Heart failure Mother   . Cancer Father    Social History:  reports that she has never smoked. She has never used smokeless tobacco. She reports that she does not drink alcohol or use illicit drugs.  Allergies: No Known Allergies  Medications Prior to Admission  Medication Sig Dispense Refill  . Cranberry 500 MG CAPS Take 500 mg by mouth daily.      . diphenhydrAMINE (BENADRYL) 25  mg capsule Take 25 mg by mouth at bedtime as needed. Allergies and sleep      . diphenhydrAMINE (SOMINEX) 25 MG tablet Take 25 mg by mouth at bedtime as needed.      . enalapril (VASOTEC) 2.5 MG tablet Take 2.5 mg by mouth daily.      . ferrous sulfate (FERROUSUL) 325 (65 FE) MG tablet Take 1 tablet (325 mg total) by mouth daily with breakfast.  30 tablet  11  . levothyroxine (SYNTHROID, LEVOTHROID) 25 MCG tablet Take 1 tablet (25 mcg total) by mouth daily.  30 tablet  11  . loratadine (CLARITIN) 10 MG tablet Take 10 mg by mouth daily as needed. allergies      . mirtazapine (REMERON) 15 MG tablet Take 7.5 mg by mouth at bedtime.      . Multiple Vitamins-Minerals (MULTIVITAMIN,TX-MINERALS) tablet Take 1 tablet by mouth daily.        Marland Kitchen omeprazole (PRILOSEC) 20 MG capsule Take 40 mg by mouth daily.      . ranitidine (ZANTAC) 150 MG tablet Take 150 mg by mouth at bedtime. One hour after supper      . sulfamethoxazole-trimethoprim (BACTRIM,SEPTRA) 400-80 MG per tablet Take 1 tablet by mouth daily.        Results for orders placed during the hospital encounter of 12/31/11 (from the past 48 hour(s))  CBC WITH DIFFERENTIAL     Status: Abnormal   Collection Time  12/31/11  1:05 PM      Component Value Range Comment   WBC 9.6  4.0 - 10.5 K/uL    RBC 3.86 (*) 3.87 - 5.11 MIL/uL    Hemoglobin 11.9 (*) 12.0 - 15.0 g/dL    HCT 16.1 (*) 09.6 - 46.0 %    MCV 92.7  78.0 - 100.0 fL    MCH 30.8  26.0 - 34.0 pg    MCHC 33.2  30.0 - 36.0 g/dL    RDW 04.5  40.9 - 81.1 %    Platelets 213  150 - 400 K/uL    Neutrophils Relative 76  43 - 77 %    Neutro Abs 7.3  1.7 - 7.7 K/uL    Lymphocytes Relative 15  12 - 46 %    Lymphs Abs 1.4  0.7 - 4.0 K/uL    Monocytes Relative 8  3 - 12 %    Monocytes Absolute 0.7  0.1 - 1.0 K/uL    Eosinophils Relative 2  0 - 5 %    Eosinophils Absolute 0.1  0.0 - 0.7 K/uL    Basophils Relative 0  0 - 1 %    Basophils Absolute 0.0  0.0 - 0.1 K/uL   COMPREHENSIVE METABOLIC PANEL      Status: Abnormal   Collection Time   12/31/11  1:05 PM      Component Value Range Comment   Sodium 135  135 - 145 mEq/L    Potassium 3.8  3.5 - 5.1 mEq/L    Chloride 101  96 - 112 mEq/L    CO2 21  19 - 32 mEq/L    Glucose, Bld 118 (*) 70 - 99 mg/dL    BUN 22  6 - 23 mg/dL    Creatinine, Ser 9.14  0.50 - 1.10 mg/dL    Calcium 9.0  8.4 - 78.2 mg/dL    Total Protein 6.5  6.0 - 8.3 g/dL    Albumin 3.6  3.5 - 5.2 g/dL    AST 30  0 - 37 U/L    ALT 22  0 - 35 U/L    Alkaline Phosphatase 123 (*) 39 - 117 U/L    Total Bilirubin 0.2 (*) 0.3 - 1.2 mg/dL    GFR calc non Af Amer 45 (*) >90 mL/min    GFR calc Af Amer 53 (*) >90 mL/min   LIPASE, BLOOD     Status: Normal   Collection Time   12/31/11  1:05 PM      Component Value Range Comment   Lipase 36  11 - 59 U/L   TROPONIN I     Status: Normal   Collection Time   12/31/11  4:27 PM      Component Value Range Comment   Troponin I <0.30  <0.30 ng/mL   URINALYSIS, ROUTINE W REFLEX MICROSCOPIC     Status: Normal   Collection Time   12/31/11  5:36 PM      Component Value Range Comment   Color, Urine YELLOW  YELLOW    APPearance CLEAR  CLEAR    Specific Gravity, Urine 1.015  1.005 - 1.030    pH 5.5  5.0 - 8.0    Glucose, UA NEGATIVE  NEGATIVE mg/dL    Hgb urine dipstick NEGATIVE  NEGATIVE    Bilirubin Urine NEGATIVE  NEGATIVE    Ketones, ur NEGATIVE  NEGATIVE mg/dL    Protein, ur NEGATIVE  NEGATIVE mg/dL    Urobilinogen, UA  1.0  0.0 - 1.0 mg/dL    Nitrite NEGATIVE  NEGATIVE    Leukocytes, UA NEGATIVE  NEGATIVE MICROSCOPIC NOT DONE ON URINES WITH NEGATIVE PROTEIN, BLOOD, LEUKOCYTES, NITRITE, OR GLUCOSE <1000 mg/dL.   Ct Head Wo Contrast  12/31/2011  *RADIOLOGY REPORT*  Clinical Data: Syncope.  CT HEAD WITHOUT CONTRAST  Technique:  Contiguous axial images were obtained from the base of the skull through the vertex without contrast.  Comparison: 08/01/2011  Findings: There is no evidence of intracranial hemorrhage, brain edema or other  signs of acute infarction.  There is no evidence of intracranial mass lesion or mass effect.  No abnormal extra-axial fluid collections are identified.  Mild cerebral atrophy is again demonstrated.  Extensive chronic small vessel disease is also unchanged.  Old right basal ganglia and deep white matter lacunar infarct again demonstrated. Ventricles are stable in size.  No skull fracture or other significant bone abnormality identified.  IMPRESSION:  1.   No acute intracranial findings. 2.  Stable cerebral atrophy, chronic small vessel disease, and old lacunar infarct.   Original Report Authenticated By: Myles Rosenthal, M.D.    Dg Abd Acute W/chest  12/31/2011  *RADIOLOGY REPORT*  Clinical Data: Abdominal pain, weakness, nausea, vomiting  ACUTE ABDOMEN SERIES (ABDOMEN 2 VIEW & CHEST 1 VIEW)  Comparison: Chest radiographs 08/01/2011  Findings: Borderline enlargement of cardiac silhouette. Tortuous aorta. Pulmonary vascularity normal. Emphysematous and bronchitic changes consistent with COPD. No acute infiltrate, pleural effusion, or pneumothorax. Diffuse osseous demineralization. Increased stool in rectum. Nonobstructive bowel gas pattern without bowel dilatation or bowel wall thickening. No free intraperitoneal air or urinary tract calcification. Superior endplate compression deformity of L2 vertebral body on AP view, uncertain acuity.  IMPRESSION: Increased stool in rectum. Question COPD. Age indeterminate superior endplate compression deformity of L2 vertebral body.   Original Report Authenticated By: Ulyses Southward, M.D.     Review of Systems  Constitutional: Negative.   HENT: Negative.   Eyes: Negative.   Respiratory: Negative.   Cardiovascular: Negative.   Gastrointestinal: Positive for nausea, vomiting and diarrhea.  Genitourinary: Negative.   Musculoskeletal: Negative.   Skin: Negative.   Neurological: Positive for loss of consciousness.  Endo/Heme/Allergies: Negative.   Psychiatric/Behavioral:  Negative.     Blood pressure 138/58, pulse 73, temperature 98.3 F (36.8 C), temperature source Oral, resp. rate 18, height 5\' 5"  (1.651 m), weight 49.7 kg (109 lb 9.1 oz), SpO2 99.00%. Physical Exam  Constitutional: She appears well-developed and well-nourished. No distress.  HENT:  Head: Normocephalic and atraumatic.  Right Ear: External ear normal.  Left Ear: External ear normal.  Nose: Nose normal.  Mouth/Throat: Oropharynx is clear and moist. No oropharyngeal exudate.  Eyes: Conjunctivae normal are normal. Pupils are equal, round, and reactive to light. Right eye exhibits no discharge. Left eye exhibits no discharge. No scleral icterus.  Neck: Normal range of motion. Neck supple.  Cardiovascular: Normal rate and regular rhythm.   Respiratory: Effort normal and breath sounds normal. No respiratory distress. She has no wheezes. She has no rales.  GI: Soft. Bowel sounds are normal. She exhibits no distension. There is no tenderness. There is no rebound.  Musculoskeletal: She exhibits no edema and no tenderness.  Neurological: She is alert.       Oriented to name. Follows commands.  Skin: She is not diaphoretic.     Assessment/Plan #1. Syncope - most likely vasovagal or could also be from her diarrhea. Continue to hydrate. Check orthostatics in a.m. Monitor in telemetry. #  2. Diarrhea - check stool for C. difficile and cultures. Gently hydrate. #3. Hypothyroidism - continue Synthroid. Check TSH. #4. Hypertension - continue present medications.  CODE STATUS - DO NOT RESUSCITATE as confirmed the patient's son.  Patient is admitted under Dr. Oliver Barre service.  Eduard Clos. 12/31/2011, 9:55 PM

## 2011-12-31 NOTE — ED Notes (Addendum)
Per pt no complaints while ambulating. Pt uses walker at home. Pt's daughter noticed slight weakness which is not normal while pt was ambulating

## 2012-01-01 DIAGNOSIS — E039 Hypothyroidism, unspecified: Secondary | ICD-10-CM

## 2012-01-01 DIAGNOSIS — R197 Diarrhea, unspecified: Secondary | ICD-10-CM

## 2012-01-01 DIAGNOSIS — R55 Syncope and collapse: Secondary | ICD-10-CM

## 2012-01-01 DIAGNOSIS — F0281 Dementia in other diseases classified elsewhere with behavioral disturbance: Secondary | ICD-10-CM

## 2012-01-01 DIAGNOSIS — F0280 Dementia in other diseases classified elsewhere without behavioral disturbance: Secondary | ICD-10-CM

## 2012-01-01 LAB — CLOSTRIDIUM DIFFICILE BY PCR: Toxigenic C. Difficile by PCR: NEGATIVE

## 2012-01-01 LAB — BASIC METABOLIC PANEL
CO2: 23 mEq/L (ref 19–32)
Chloride: 106 mEq/L (ref 96–112)
Creatinine, Ser: 1.08 mg/dL (ref 0.50–1.10)

## 2012-01-01 LAB — CBC WITH DIFFERENTIAL/PLATELET
Eosinophils Relative: 2 % (ref 0–5)
HCT: 32.4 % — ABNORMAL LOW (ref 36.0–46.0)
Lymphocytes Relative: 13 % (ref 12–46)
Lymphs Abs: 0.9 10*3/uL (ref 0.7–4.0)
MCV: 93.4 fL (ref 78.0–100.0)
Monocytes Absolute: 0.8 10*3/uL (ref 0.1–1.0)
Monocytes Relative: 11 % (ref 3–12)
RDW: 13.3 % (ref 11.5–15.5)
WBC: 6.9 10*3/uL (ref 4.0–10.5)

## 2012-01-01 NOTE — Progress Notes (Signed)
Subjective: [name] has been admitted after a syncopal episode at her AL facility. Per family report she has had several syncopal episodes in the last year. This even occurred in the "beauty shop" which was rather warm. After she regained consciousness she had N/V. She has also had diarrhea for several days and this is associated with use of antibiotics for UTI.  Objective: Lab: Lab Results  Component Value Date   WBC 6.9 01/01/2012   HGB 10.5* 01/01/2012   HCT 32.4* 01/01/2012   MCV 93.4 01/01/2012   PLT 183 01/01/2012   BMET    Component Value Date/Time   NA 137 01/01/2012 0510   K 3.9 01/01/2012 0510   CL 106 01/01/2012 0510   CO2 23 01/01/2012 0510   GLUCOSE 95 01/01/2012 0510   BUN 19 01/01/2012 0510   CREATININE 1.08 01/01/2012 0510   CALCIUM 8.6 01/01/2012 0510   GFRNONAA 44* 01/01/2012 0510   GFRAA 51* 01/01/2012 0510   Stool - negative for C.Diff  Imaging: CT brain w/o acute findings.      Acute abdominal series - normal with a notable stool burden  Scheduled Meds:   . enalapril  2.5 mg Oral Daily  . famotidine  20 mg Oral Daily  . ferrous sulfate  325 mg Oral QPC breakfast  . levothyroxine  25 mcg Oral QAC breakfast  . mirtazapine  7.5 mg Oral QHS  . [COMPLETED] ondansetron (ZOFRAN) IV  4 mg Intravenous Once  . pantoprazole  40 mg Oral Daily  . sodium chloride  3 mL Intravenous Q12H  . [DISCONTINUED] sodium chloride   Intravenous STAT   Continuous Infusions:   . [EXPIRED] sodium chloride 75 mL/hr at 12/31/11 2304  . [DISCONTINUED] sodium chloride 75 mL/hr at 12/31/11 1735   PRN Meds:.acetaminophen, acetaminophen, diphenhydrAMINE, ondansetron (ZOFRAN) IV, ondansetron, [DISCONTINUED] diphenhydrAMINE, [DISCONTINUED] ondansetron (ZOFRAN) IV   Physical Exam: Filed Vitals:   01/01/12 0603  BP: 131/50  Pulse: 67  Temp:   Resp:     Intake/Output Summary (Last 24 hours) at 01/01/12 1251 Last data filed at 01/01/12 1200  Gross per 24 hour  Intake    640 ml   Output      0 ml  Net    640 ml   Gen'l - very elderly white woman who is awake and talkative at exam. She doesn't like her assisted living facility HEENT- no signs of trauma Cor- RRR, 2+ radial pulse Pulm - normal respirations Abd - BS+ x 4, soft, no guarding or rebound, no tenderness Neuro - awake, knows where she is, follows commands, speech is clear and appropriate.     Assessment/Plan: 1. Syncope - normal CT brain, non-focal exam. Agree that this may be vaso-vaga. Plan Continue to hydrate  Continue tele monitoring  Orthostatic vitals  2. GI- patient with loose stools but stool burden on x-ray. Question of obstipation. Stools is negative for C. Diff. Plan -  Continue fluids  Recheck for C.diff  3. HTN - stable     Illene Regulus [facility] (o) [phone]; (c) [phone] Call-grp - Patsi [facility]  Tele: [phone]  01/01/2012, 12:47 PM

## 2012-01-01 NOTE — Progress Notes (Signed)
   CARE MANAGEMENT NOTE 01/01/2012  Patient:  [name], [name]   Account Number:  [id number]  Date Initiated:  01/01/2012  Documentation initiated by:  Jiles Crocker  Subjective/Objective Assessment:   ADMITTED WITH SYNCOPY     Action/Plan:   PATIENT RESIDES IN AN ASSISTED LIVING FACILITY; SOC WORKER REFERRAL PLACED   Anticipated DC Date:  01/03/2012   Anticipated DC Plan:  ASSISTED LIVING / REST HOME  In-house referral  Clinical Social Worker      DC Associate Professor  CM consult         Status of service:  In process, will continue to follow Medicare Important Message given?  NA - LOS <3 / Initial given by admissions (If response is "NO", the following Medicare IM given date fields will be blank)  Per UR Regulation:  Reviewed for med. necessity/level of care/duration of stay  Comments:  01/01/2012- B Srihan Brutus RN,BSN,MHA

## 2012-01-01 NOTE — Progress Notes (Signed)
Pt had moderate size brown liquid stool; cdiff and culture sent to lab. Pt not following commands or participating in care. Unable to get pt to stand for orthostatic vs.

## 2012-01-02 DIAGNOSIS — R5381 Other malaise: Secondary | ICD-10-CM

## 2012-01-02 DIAGNOSIS — I10 Essential (primary) hypertension: Secondary | ICD-10-CM

## 2012-01-02 LAB — URINE CULTURE: Colony Count: NO GROWTH

## 2012-01-02 LAB — BASIC METABOLIC PANEL
Chloride: 107 mEq/L (ref 96–112)
GFR calc Af Amer: 68 mL/min — ABNORMAL LOW (ref >90)
Potassium: 3.7 mEq/L (ref 3.5–5.1)
Sodium: 139 mEq/L (ref 135–145)

## 2012-01-02 NOTE — Evaluation (Signed)
Physical Therapy Evaluation Patient Details Name: [name] MRN: [id number] DOB: August 16, 1919 Today's Date: 01/02/2012 Time: 0802-0827 PT Time Calculation (min): 25 min  PT Assessment / Plan / Recommendation Clinical Impression  Pt. was admitted 12/o3 after syncopal episode while having hair fixed. Pt. had also N/V/D. Pt. resides in ALF and was ambulatory  with RW. Pt. will benefit from PT to improve in functional independence  to return to ALF.    PT Assessment  Patient needs continued PT services    Follow Up Recommendations  Home health PT    Does the patient have the potential to tolerate intense rehabilitation      Barriers to Discharge        Equipment Recommendations  None recommended by PT    Recommendations for Other Services     Frequency Min 3X/week    Precautions / Restrictions Precautions Precautions: Fall   Pertinent Vitals/Pain No c/o     Mobility  Bed Mobility Supine to Sit: 5: Supervision;HOB elevated;With rails Details for Bed Mobility Assistance: min cues for hand placement and technique. Transfers Sit to Stand: 4: Min assist;With upper extremity assist;From bed;From chair/3-in-1 Stand to Sit: 4: Min assist;With upper extremity assist;With armrests;To chair/3-in-1 Details for Transfer Assistance: min cues for hand placement and safety. Some assist to initially rise and stabilize self. Ambulation/Gait Ambulation/Gait Assistance: 4: Min assist Ambulation Distance (Feet): 150 Feet Assistive device: Rolling walker Ambulation/Gait Assistance Details: pt has abnormal gait, tends to lateral shift to L hip, also ambulates with knees flexed and then extends knees during stance, Gait Pattern: Step-through pattern;Decreased step length - right;Decreased step length - left;Decreased weight shift to right;Lateral trunk lean to left;Trunk flexed;Lateral hip instability    Shoulder Instructions     Exercises     PT Diagnosis: Generalized weakness  PT  Problem List: Decreased activity tolerance;Decreased mobility;Decreased cognition;Decreased knowledge of precautions PT Treatment Interventions: DME instruction;Gait training;Functional mobility training;Therapeutic activities   PT Goals Acute Rehab PT Goals PT Goal Formulation: With patient Potential to Achieve Goals: Good Pt will go Sit to Stand: with modified independence PT Goal: Sit to Stand - Progress: Goal set today Pt will go Stand to Sit: with modified independence PT Goal: Stand to Sit - Progress: Goal set today Pt will Ambulate: >150 feet;with supervision;with rolling walker PT Goal: Ambulate - Progress: Goal set today  Visit Information  Last PT Received On: 01/02/12 Assistance Needed: +1    Subjective Data  Subjective: I can get up an do for my self. I walk to get my meals. Patient Stated Goal: agreed to get up   Prior Functioning  Home Living Lives With: Other (Comment) (ALF) Type of Home: Assisted living Home Adaptive Equipment: Walker - rolling Prior Function Level of Independence: Independent with assistive device(s) Comments: Unsure of pt reliability. Pt was confused at times.    Cognition  Overall Cognitive Status: No family/caregiver present to determine baseline cognitive functioning Arousal/Alertness: Awake/alert Orientation Level: Place;Disoriented to;Situation Behavior During Session: [facility] for tasks performed Memory Deficits: Pt was unable to recall her place of residence Following Commands: Follows one step commands with increased time    Extremity/Trunk Assessment Right Upper Extremity Assessment RUE ROM/Strength/Tone: [facility] for tasks assessed Left Upper Extremity Assessment LUE ROM/Strength/Tone: WFL for tasks assessed Right Lower Extremity Assessment RLE ROM/Strength/Tone: WFL for tasks assessed RLE Sensation: WFL - Light Touch Left Lower Extremity Assessment LLE ROM/Strength/Tone: WFL for tasks assessed LLE Sensation: WFL - Light Touch    Balance Static Sitting Balance Static  Sitting - Balance Support: No upper extremity supported;Feet supported Static Sitting - Level of Assistance: 5: Stand by assistance  End of Session PT - End of Session Activity Tolerance: Patient tolerated treatment well Patient left: in chair;with call bell/phone within reach;with chair alarm set Nurse Communication: Mobility status  GP Functional Assessment Tool Used: clinical judgement. Functional Limitation: Mobility: Walking and moving around Mobility: Walking and Moving Around Current Status [phone]): At least 20 percent but less than 40 percent impaired, limited or restricted Mobility: Walking and Moving Around Goal Status [phone]): At least 1 percent but less than 20 percent impaired, limited or restricted   Rada Hay 01/02/2012, 1:04 PM  [phone]

## 2012-01-02 NOTE — Progress Notes (Signed)
Patient set to discharge back to [facility]F today. CSW faxed discharge summary & FL2, confirmed with Lupita Leash @ ALF that she is ok to return. Son at bedside and will transport her. CSW signing off.   Unice Bailey, LCSW [facility] Clinical Social Worker cell #: [phone]

## 2012-01-02 NOTE — Progress Notes (Signed)
Subjective: No report of any events. Pt up in chair  Objective: Lab: Lab Results  Component Value Date   WBC 6.9 01/01/2012   HGB 10.5* 01/01/2012   HCT 32.4* 01/01/2012   MCV 93.4 01/01/2012   PLT 183 01/01/2012   BMET    Component Value Date/Time   NA 139 01/02/2012 0429   K 3.7 01/02/2012 0429   CL 107 01/02/2012 0429   CO2 24 01/02/2012 0429   GLUCOSE 94 01/02/2012 0429   BUN 14 01/02/2012 0429   CREATININE 0.85 01/02/2012 0429   CALCIUM 8.8 01/02/2012 0429   GFRNONAA 59* 01/02/2012 0429   GFRAA 68* 01/02/2012 0429     Imaging:  Scheduled Meds:   . enalapril  2.5 mg Oral Daily  . famotidine  20 mg Oral Daily  . ferrous sulfate  325 mg Oral QPC breakfast  . levothyroxine  25 mcg Oral QAC breakfast  . mirtazapine  7.5 mg Oral QHS  . pantoprazole  40 mg Oral Daily  . sodium chloride  3 mL Intravenous Q12H   Continuous Infusions:   . [EXPIRED] sodium chloride 75 mL/hr at 12/31/11 2304   PRN Meds:.acetaminophen, acetaminophen, diphenhydrAMINE, ondansetron (ZOFRAN) IV, ondansetron   Physical Exam: Filed Vitals:   01/02/12 0612  BP: 154/94  Pulse: 103  Temp:   Resp:         Assessment/Plan: Stable and ready to return to Assisted Living.  Dictated #[id number] FL-2 signed   Illene Regulus  IM (o) [phone]; (c) [phone] Call-grp - Patsi Sears IM  Tele: [phone]  01/02/2012, 9:20 AM

## 2012-01-02 NOTE — Evaluation (Signed)
Occupational Therapy Evaluation Patient Details Name: [name] MRN: [id number] DOB: 11-Apr-1919 Today's Date: 01/02/2012 Time: 0802-0830 OT Time Calculation (min): 28 min  OT Assessment / Plan / Recommendation Clinical Impression  Pt is a 78 yo ALF resident who presents to the hospital with syncope and collapse. Pt appears close to baseline functional status. Skilled OT indicated to maximize independence with BADLs to supervision level in prep for safe d/c back to ALF.    OT Assessment  Patient needs continued OT Services    Follow Up Recommendations  Home health OT    Barriers to Discharge      Equipment Recommendations  None recommended by OT    Recommendations for Other Services    Frequency  Min 2X/week    Precautions / Restrictions Precautions Precautions: Fall Restrictions Weight Bearing Restrictions: No   Pertinent Vitals/Pain C/o L hip pain with activity that she did not rate. Repositioned for comfort.    ADL  Grooming: Performed;Wash/dry face;Brushing hair;Set up Where Assessed - Grooming: Unsupported sitting Upper Body Bathing: Performed;Set up Where Assessed - Upper Body Bathing: Unsupported sitting Lower Body Bathing: Performed;Min guard Where Assessed - Lower Body Bathing: Supported sit to stand Upper Body Dressing: Performed;Set up Where Assessed - Upper Body Dressing: Unsupported sitting Lower Body Dressing: Performed;Minimal assistance (to thread LEs into underwear.) Where Assessed - Lower Body Dressing: Supported sit to stand Toilet Transfer: Performed;Minimal assistance Toilet Transfer Method: Surveyor, minerals: Materials engineer and Hygiene: Performed;Minimal assistance (for thoroghness with hygeine.) Where Assessed - Engineer, mining and Hygiene: Sit to stand from 3-in-1 or toilet Equipment Used: Rolling walker Transfers/Ambulation Related to ADLs: Pt ambulated around room  with B knees flexed and c/o L hjp pain. ADL Comments: Pt incontinent of urine upon arrival. Assisted pt to [facility] for spongebath.    OT Diagnosis: Generalized weakness  OT Problem List: Decreased activity tolerance;Decreased cognition;Decreased safety awareness;Pain;Decreased knowledge of use of DME or AE OT Treatment Interventions: Self-care/ADL training;Therapeutic activities;DME and/or AE instruction;Patient/family education   OT Goals Acute Rehab OT Goals OT Goal Formulation: With patient Time For Goal Achievement: 01/16/12 Potential to Achieve Goals: Good ADL Goals Pt Will Perform Grooming: with supervision;Standing at sink ADL Goal: Grooming - Progress: Goal set today Pt Will Perform Lower Body Bathing: with supervision;Sit to stand from chair;Sit to stand from bed ADL Goal: Lower Body Bathing - Progress: Goal set today Pt Will Perform Lower Body Dressing: with supervision;Sit to stand from chair;Sit to stand from bed ADL Goal: Lower Body Dressing - Progress: Goal set today Pt Will Transfer to Toilet: with supervision;with DME;Ambulation ADL Goal: Toilet Transfer - Progress: Goal set today Pt Will Perform Toileting - Clothing Manipulation: with supervision;Sitting on 3-in-1 or toilet;Standing ADL Goal: Toileting - Clothing Manipulation - Progress: Goal set today Pt Will Perform Toileting - Hygiene: with supervision;Sit to stand from 3-in-1/toilet ADL Goal: Toileting - Hygiene - Progress: Goal set today  Visit Information  Last OT Received On: 01/02/12 Assistance Needed: +1 PT/OT Co-Evaluation/Treatment: Yes    Subjective Data  Subjective: I'm still at [facility] aren't I? Patient Stated Goal: Not asked.   Prior Functioning     Home Living Lives With: Other (Comment) (ALF) Type of Home: Assisted living Home Adaptive Equipment: Walker - rolling Prior Function Level of Independence: Independent with assistive device(s) Comments: Unsure of pt reliability. Pt was confused  at times.         Vision/Perception     Cognition  Overall Cognitive Status: No family/caregiver present to determine baseline cognitive functioning Arousal/Alertness: Awake/alert Orientation Level: Disoriented to;Place;Situation Behavior During Session: [facility] for tasks performed Memory Deficits: Pt was unable to recall her place of residence. Following Commands: Follows one step commands with increased time    Extremity/Trunk Assessment Right Upper Extremity Assessment RUE ROM/Strength/Tone: [facility] for tasks assessed Left Upper Extremity Assessment LUE ROM/Strength/Tone: WFL for tasks assessed     Mobility Bed Mobility Supine to Sit: 5: Supervision;HOB elevated;With rails Details for Bed Mobility Assistance: min cues for hand placement and technique. Transfers Sit to Stand: 4: Min assist;With upper extremity assist;From bed;From chair/3-in-1 Stand to Sit: 4: Min assist;With upper extremity assist;With armrests;To chair/3-in-1 Details for Transfer Assistance: min cues for hand placement and safety. Some assist to initially rise and stabilize self.     Shoulder Instructions     Exercise     Balance Static Sitting Balance Static Sitting - Balance Support: No upper extremity supported;Feet supported Static Sitting - Level of Assistance: 5: Stand by assistance   End of Session OT - End of Session Activity Tolerance: Patient tolerated treatment well Patient left: in chair;with call bell/phone within reach;with chair alarm set  GO Functional Assessment Tool Used: Clinical Judgement Functional Limitation: Self care Self Care Current Status (X9147): At least 20 percent but less than 40 percent impaired, limited or restricted Self Care Goal Status (W2956): At least 1 percent but less than 20 percent impaired, limited or restricted   [name] A OTR/L [phone] 01/02/2012, 10:26 AM

## 2012-01-02 NOTE — Progress Notes (Signed)
Pt left hospital at this time with her son at her side.  Writer called [employer] and gave report and informed of pt's return.  All needed paperwork sent with pt's son to give to staff at [facility].  Pt alert and without c/o.  Ambulated in hall with assist X one and walker prior to discharge.

## 2012-01-02 NOTE — Discharge Summary (Signed)
NAMECOOPER, STAMP           ACCOUNT NO.:  [id number]  MEDICAL RECORD NO.:  [id number]  LOCATION:  1437                         FACILITY:  [facility]  PHYSICIAN:  Rosalyn Gess. [name], MD  DATE OF BIRTH:  [birth year]  DATE OF ADMISSION:  12/31/2011 DATE OF DISCHARGE:  01/02/2012                              DISCHARGE SUMMARY   ADMITTING DIAGNOSIS:  Syncope.  DISCHARGE DIAGNOSES: 1. Vasovagal syncope. 2. Hypothyroid disease, stable. 3. Hypertension, stable.  CONSULTANTS:  None.  PROCEDURES: 1. Acute abdominal and chest film, which showed increased stool in the     rectum, question of COPD, age indeterminate end-plate compression     fracture of L2 vertebral body. 2. CT of the head without contrast performed on day of admission,     which showed no acute intracranial findings.  Stable cerebral     atrophy, chronic small vessel disease and old lacunar infarct.  HISTORY OF PRESENT ILLNESS:  Ms. Kovack is a delightful 78 year old woman who was at assisted living, was brought to the emergency department after a syncopal episode while having her hair done.  Her son reports that she has had several other episodes during the last year. This seems to be associated with getting into a warm environment.  Just prior to her syncopal episode, the patient did report nausea and did have an episode of emesis.  She had a second episode of emesis on the way to the hospital.  The patient reports she has had diarrhea for 2 days prior to admission.  She had been on antibiotics for UTI.  In the emergency department, she was evaluated with an unremarkable abdominal series, and CT of the brain which was unremarkable.  She was admitted for followup on observation.  Please see the H and P for past medical history, family history, social history, as well as admission examination.  HOSPITAL COURSE: 1. Syncope.  The patient has been on telemetry with no abnormal rhythm     patterns.  She was well  hydrated with IV fluids.  Orthostatic vital     signs were requested and performed on January 02, 2012, in the     morning showing a blood pressure of 153/67 sitting with a heart     rate of 90, standing she had a blood pressure of 154/94 with a     heart rate of 103.  The patient has had no recurrent syncopal     episodes while in hospital and reports she is feeling well with no     episodes of lightheadedness or other neurologic symptoms.  At this     point, with no further episodes of syncope and a negative     evaluation, given she most likely had a vasovagal episode, there is     no need for further inpatient evaluation, she is ready for     discharge. 2. GI.  The patient had loose stools.  She had been on antibiotics.     She had initial stool checked for C. diff that was negative and     this was by PCR.  Hospital requirement is that she does not have     another study for at least 72  hours.  The patient has not had any     recurrent diarrhea and is therefore stable. 3. Hypertension.  It has been stable and well controlled.  The patient being medically stable with no recurrent episodes of syncope with no focal neurologic findings with a negative CT scan of the brain, at this point, she is ready to return to her assisted living facility.  DISCHARGE EXAMINATION:  GENERAL APPEARANCE:  The patient was awake, alert, sitting in her chair, having had a good breakfast.  This is an elderly Caucasian female, in no acute distress. VITAL SIGNS:  Temperature was 98.6, blood pressure 154/94, pulse 103, respirations 20, oxygen saturations 97% on room air. HEENT:  Conjunctivae and sclerae were clear.  Pupils equal, round, and reactive. NECK:  Supple. CHEST:  The patient has got no deformity except for very mild kyphosis. PULMONARY:  The patient has got normal respirations.  She has no wheezes, rales, or rhonchi.  No increased work of breathing. CARDIOVASCULAR:  Radial pulse 2+.  Her  precordium is quiet.  She has a regular tachycardia without significant murmurs. ABDOMEN:  Soft.  No guarding or rebound was noted. NEUROLOGIC:  The patient is awake, alert.  She is oriented.  Her speech is clear and she is communicative.  Cranial nerves II-XII were intact with normal facial symmetry and movement.  Extraocular muscles were intact.  The patient is able to move all her extremities to command.  FINAL LABORATORY:  From January 02, 2012, date of discharge, sodium 139, potassium 3.7, chloride 107, CO2 of 24, BUN of 14, creatinine of 0.85, glucose was 94.  CBC from January 01, 2012, with a white count of 6900, hemoglobin 10.5 g, hematocrit 32.4%, platelet count 183,000.  She had a normal differential.  The patient had negative troponins x2.  The patient had 12 lead EKG, which was unremarkable.  DISPOSITION:  The patient is ready for transfer back to assisted living and FL2 form is complete.  The patient remains a DNR and DNR she is returning to the facility with her.  The patient is closely followed by her family at a skilled care facility.  We will therefore not schedule her for a followup appointment but see her on an as-needed basis given that she is very stable at the time of discharge dictation.     Rosalyn Gess [name], MD     MEN/MEDQ  D:  01/02/2012  T:  01/02/2012  Job:  [id number]

## 2012-01-05 LAB — STOOL CULTURE

## 2012-01-30 ENCOUNTER — Other Ambulatory Visit: Payer: Self-pay | Admitting: *Deleted

## 2012-01-30 NOTE — Telephone Encounter (Signed)
Ok to refill 

## 2012-01-31 MED ORDER — SULFAMETHOXAZOLE-TRIMETHOPRIM 400-80 MG PO TABS: 1.0000 | ORAL_TABLET | Freq: Every day | ORAL | Status: DC

## 2012-02-07 DIAGNOSIS — Z0279 Encounter for issue of other medical certificate: Secondary | ICD-10-CM

## 2012-03-04 ENCOUNTER — Other Ambulatory Visit: Payer: Self-pay | Admitting: *Deleted

## 2012-03-04 MED ORDER — SULFAMETHOXAZOLE-TRIMETHOPRIM 400-80 MG PO TABS: 1.0000 | ORAL_TABLET | Freq: Every day | ORAL | Status: DC

## 2012-03-04 NOTE — Telephone Encounter (Signed)
Pt in nursing home. Rx filled with [facility]

## 2012-03-04 NOTE — Telephone Encounter (Signed)
Opened encounter in error  

## 2012-03-20 DIAGNOSIS — Z0279 Encounter for issue of other medical certificate: Secondary | ICD-10-CM

## 2012-05-10 ENCOUNTER — Encounter (HOSPITAL_COMMUNITY): Payer: Self-pay | Admitting: *Deleted

## 2012-05-10 ENCOUNTER — Emergency Department (HOSPITAL_COMMUNITY): Payer: Medicare Other

## 2012-05-10 ENCOUNTER — Emergency Department (HOSPITAL_COMMUNITY)
Admission: EM | Admit: 2012-05-10 | Discharge: 2012-05-10 | Disposition: A | Discharge status: Home / Self Care | Payer: Medicare Other | Attending: Emergency Medicine | Admitting: Emergency Medicine

## 2012-05-10 DIAGNOSIS — Z8709 Personal history of other diseases of the respiratory system: Secondary | ICD-10-CM | POA: Insufficient documentation

## 2012-05-10 DIAGNOSIS — E038 Other specified hypothyroidism: Secondary | ICD-10-CM | POA: Insufficient documentation

## 2012-05-10 DIAGNOSIS — N390 Urinary tract infection, site not specified: Secondary | ICD-10-CM | POA: Insufficient documentation

## 2012-05-10 DIAGNOSIS — I10 Essential (primary) hypertension: Secondary | ICD-10-CM | POA: Insufficient documentation

## 2012-05-10 DIAGNOSIS — R42 Dizziness and giddiness: Secondary | ICD-10-CM | POA: Insufficient documentation

## 2012-05-10 DIAGNOSIS — K219 Gastro-esophageal reflux disease without esophagitis: Secondary | ICD-10-CM | POA: Insufficient documentation

## 2012-05-10 DIAGNOSIS — E785 Hyperlipidemia, unspecified: Secondary | ICD-10-CM | POA: Insufficient documentation

## 2012-05-10 DIAGNOSIS — Z79899 Other long term (current) drug therapy: Secondary | ICD-10-CM | POA: Insufficient documentation

## 2012-05-10 DIAGNOSIS — Z8739 Personal history of other diseases of the musculoskeletal system and connective tissue: Secondary | ICD-10-CM | POA: Insufficient documentation

## 2012-05-10 LAB — CBC
HCT: 29.5 % — ABNORMAL LOW (ref 36.0–46.0)
Hemoglobin: 9.7 g/dL — ABNORMAL LOW (ref 12.0–15.0)
MCHC: 32.9 g/dL (ref 30.0–36.0)
RBC: 3.28 MIL/uL — ABNORMAL LOW (ref 3.87–5.11)
WBC: 11.1 10*3/uL — ABNORMAL HIGH (ref 4.0–10.5)

## 2012-05-10 LAB — DIFFERENTIAL
Basophils Relative: 0 % (ref 0–1)
Lymphocytes Relative: 14 % (ref 12–46)
Lymphs Abs: 1.6 10*3/uL (ref 0.7–4.0)
Monocytes Absolute: 1.6 10*3/uL — ABNORMAL HIGH (ref 0.1–1.0)
Monocytes Relative: 14 % — ABNORMAL HIGH (ref 3–12)
Neutro Abs: 8 10*3/uL — ABNORMAL HIGH (ref 1.7–7.7)
Neutrophils Relative %: 72 % (ref 43–77)

## 2012-05-10 LAB — PROTIME-INR: INR: 1.06 (ref 0.00–1.49)

## 2012-05-10 LAB — COMPREHENSIVE METABOLIC PANEL
Albumin: 3.1 g/dL — ABNORMAL LOW (ref 3.5–5.2)
Alkaline Phosphatase: 107 U/L (ref 39–117)
BUN: 22 mg/dL (ref 6–23)
CO2: 23 mEq/L (ref 19–32)
Chloride: 96 mEq/L (ref 96–112)
Creatinine, Ser: 1.2 mg/dL — ABNORMAL HIGH (ref 0.50–1.10)
GFR calc non Af Amer: 39 mL/min — ABNORMAL LOW (ref >90)
Potassium: 4 mEq/L (ref 3.5–5.1)
Total Bilirubin: 0.3 mg/dL (ref 0.3–1.2)

## 2012-05-10 LAB — URINALYSIS, ROUTINE W REFLEX MICROSCOPIC
Glucose, UA: NEGATIVE mg/dL
Ketones, ur: NEGATIVE mg/dL
Protein, ur: NEGATIVE mg/dL
Urobilinogen, UA: 0.2 mg/dL (ref 0.0–1.0)

## 2012-05-10 LAB — APTT: aPTT: 35 seconds (ref 24–37)

## 2012-05-10 LAB — URINE MICROSCOPIC-ADD ON

## 2012-05-10 MED ORDER — DEXTROSE 5 % IV SOLN
1.0000 g | INTRAVENOUS | Status: DC
  Administered 2012-05-10: 1 g via INTRAVENOUS
  Filled 2012-05-10: qty 10

## 2012-05-10 MED ORDER — CEPHALEXIN 250 MG PO CAPS: 250.0000 mg | ORAL_CAPSULE | Freq: Four times a day (QID) | ORAL | Status: DC

## 2012-05-10 NOTE — ED Notes (Signed)
ems- patient states left leg gave out. Nursing asst. was helping her back from bathroom and left leg gave out after feeling weak and dizzy. Helped to grow but did not fall. cbg 143, vs -125/61, 76hr, 97% ra, 18rr, hx- DNR, htn , hypothyroidism, high fall risk.

## 2012-05-10 NOTE — ED Notes (Signed)
BJY:[id number]<FA> Expected date:<BR> Expected time:<BR> Means of arrival:<BR> Comments:<BR> EMS/79 yo female/dizzy

## 2012-05-10 NOTE — ED Notes (Signed)
Daughter was taking patient back to residence but changed her mind due to patient's difficulty ambulating. Awaiting PTR.

## 2012-05-10 NOTE — ED Notes (Signed)
Patient in hallway

## 2012-05-10 NOTE — ED Provider Notes (Signed)
History    CSN: [id number] Arrival date & time 05/10/12  4098 First MD Initiated Contact with Patient 05/10/12 0730     Chief Complaint  Patient presents with  . Extremity Weakness    left leg went limp in transferring from bathroom    HPI Pt does not remember what happened this morning.  She states she generally has some trouble walking and has to use her walker.  She does not remember the episode where her leg gave out and had to be helped to the ground.  She denies chest pain, shortness of breath, headache or fevers.    She denies any weakness in her arms or legs.  According to EMS.  Nursing assistant was helping her to go back from bathroom when her legs started to give out and she felt weaking and dizzy.  Pt was helped to the floor.    She usually uses a walker and says that she has been having trouble with her left leg for the last year.  Not really pain but more difficulty with weakness. Past Medical History  Diagnosis Date  . Unspecified hypothyroidism   . Disorder of bone and cartilage, unspecified   . Chronic airway obstruction, not elsewhere classified   . Unspecified essential hypertension   . Esophageal reflux   . Other and unspecified hyperlipidemia     Past Surgical History  Procedure Laterality Date  . Cataract extractions      bilateral  . Abdominal hysterectomy      total  . Bilateral salpingoopherectomy    . Rectal polyp removal      No malignancy identified    Family History  Problem Relation Age of Onset  . Heart failure Mother   . Cancer Father     History  Substance Use Topics  . Smoking status: Never Smoker   . Smokeless tobacco: Never Used  . Alcohol Use: No    OB History   Grav Para Term Preterm Abortions TAB SAB Ect Mult Living                  Review of Systems  Constitutional: Negative for fever.  Respiratory: Negative for shortness of breath.   Cardiovascular: Negative for chest pain.  Gastrointestinal: Negative for abdominal  pain and blood in stool.  Genitourinary: Negative for dysuria.  Neurological: Negative for headaches.  All other systems reviewed and are negative.    Allergies  Review of patient's allergies indicates no known allergies.  Home Medications   Current Outpatient Rx  Name  Route  Sig  Dispense  Refill  . acetaminophen (TYLENOL) 325 MG tablet   Oral   Take 650 mg by mouth every 6 (six) hours as needed for pain.         . Cranberry 500 MG CAPS   Oral   Take 500 mg by mouth daily.         . diphenhydrAMINE (BENADRYL) 25 mg capsule   Oral   Take 25 mg by mouth at bedtime as needed. Allergies and sleep         . enalapril (VASOTEC) 2.5 MG tablet   Oral   Take 2.5 mg by mouth every morning.          . ferrous sulfate (FERROUSUL) 325 (65 FE) MG tablet   Oral   Take 1 tablet (325 mg total) by mouth daily with breakfast.   30 tablet   11   . mirtazapine (REMERON) 7.5 MG tablet  Oral   Take 7.5 mg by mouth at bedtime.         . Multiple Vitamins-Minerals (MULTIVITAMIN,TX-MINERALS) tablet   Oral   Take 1 tablet by mouth daily.           Marland Kitchen omeprazole (PRILOSEC) 20 MG capsule   Oral   Take 40 mg by mouth daily.         . ranitidine (ZANTAC) 150 MG tablet   Oral   Take 150 mg by mouth at bedtime. One hour after supper         . sulfamethoxazole-trimethoprim (SEPTRA) 400-80 MG per tablet   Oral   Take 1 tablet by mouth daily.   30 tablet   11   . cephALEXin (KEFLEX) 250 MG capsule   Oral   Take 1 capsule (250 mg total) by mouth 4 (four) times daily.   28 capsule   0   . loratadine (CLARITIN) 10 MG tablet   Oral   Take 10 mg by mouth daily as needed. allergies           BP 140/60  Pulse 82  Temp(Src) 98.4 F (36.9 C) (Oral)  Resp 20  SpO2 97%  Physical Exam  Nursing note and vitals reviewed. Constitutional: She appears well-developed and well-nourished. No distress.  Elderly, frail   HENT:  Head: Normocephalic and atraumatic.  Right  Ear: External ear normal.  Left Ear: External ear normal.  Mouth/Throat: Oropharynx is clear and moist.  Eyes: Conjunctivae are normal. Right eye exhibits no discharge. Left eye exhibits no discharge. No scleral icterus.  Neck: Neck supple. No tracheal deviation present.  Cardiovascular: Normal rate, regular rhythm and intact distal pulses.   Pulmonary/Chest: Effort normal and breath sounds normal. No stridor. No respiratory distress. She has no wheezes. She has no rales.  Abdominal: Soft. Bowel sounds are normal. She exhibits no distension. There is no tenderness. There is no rebound and no guarding.  Musculoskeletal: She exhibits no edema and no tenderness.  Neurological: She is alert. She has normal strength. No sensory deficit. Cranial nerve deficit:  no gross defecits noted. She exhibits normal muscle tone. She displays no seizure activity. Coordination normal.  No pronator drift bilateral upper extrem, able to hold left legs off bed for 5 seconds, difficulty holding left leg off the bed for more than a few seconds without drifting , sensation intact in all extremities, no visual field cuts, no left or right sided neglect  Skin: Skin is warm and dry. No rash noted.  Psychiatric: She has a normal mood and affect.    ED Course  Procedures (including critical care time) EKG Normal sinus rhythm, rate 85 Total R wave in V2 Normal ST T waves Normal intervals Normal axis When compared to EKG dated 12/31/2011 feet tall R-wave in V2 is new Labs Reviewed  CBC - Abnormal; Notable for the following:    WBC 11.1 (*)    RBC 3.28 (*)    Hemoglobin 9.7 (*)    HCT 29.5 (*)    All other components within normal limits  DIFFERENTIAL - Abnormal; Notable for the following:    Neutro Abs 8.0 (*)    Monocytes Relative 14 (*)    Monocytes Absolute 1.6 (*)    All other components within normal limits  COMPREHENSIVE METABOLIC PANEL - Abnormal; Notable for the following:    Sodium 131 (*)    Glucose,  Bld 114 (*)    Creatinine, Ser 1.20 (*)  Albumin 3.1 (*)    GFR calc non Af Amer 39 (*)    GFR calc Af Amer 45 (*)    All other components within normal limits  URINALYSIS, ROUTINE W REFLEX MICROSCOPIC - Abnormal; Notable for the following:    APPearance CLOUDY (*)    Hgb urine dipstick MODERATE (*)    Leukocytes, UA LARGE (*)    All other components within normal limits  URINE MICROSCOPIC-ADD ON - Abnormal; Notable for the following:    Bacteria, UA MANY (*)    All other components within normal limits  URINE CULTURE  PROTIME-INR  APTT  TROPONIN I   Ct Head Wo Contrast  05/10/2012  *RADIOLOGY REPORT*  Clinical Data: Extremity weakness.  CT HEAD WITHOUT CONTRAST  Technique:  Contiguous axial images were obtained from the base of the skull through the vertex without contrast.  Comparison: 12/31/2011  Findings: There is diffuse cerebral atrophy and diffuse white matter disease in the periventricular and subcortical white matter. Again noted is an old infarct in the right basal ganglia region and possibly the right caudate.  There is no evidence for acute hemorrhage, mass lesion, midline shift or hydrocephalus.  The visualized sinuses are clear.  No acute bony abnormality.  IMPRESSION:  No acute intracranial abnormality.  Diffuse atrophy and white matter disease.  White matter disease likely represents chronic small vessel ischemic changes.  Old infarcts as described.   Original Report Authenticated By: Richarda Overlie, M.D.      1. UTI (urinary tract infection)       MDM  The patient appears to have a urinary tract infection. I suspect that this contributed to her weakness. She also did mention that she been having some chronic issues with her left hip. Patient did not fall to the ground. She denies any acute pain I doubt any injury associated with this weakness spell.  The patient has no acute findings on the CT scan. I certainly cannot exclude a small stroke but her exam is not consistent  and she does have other findings that would account for her symptoms.  Patient was given a dose of metabolic syndrome or department. Family states that she is at her baseline. We will discharge her back to her nursing facility where they will be able to assist her.       Celene Kras, MD 05/10/12 [phone]

## 2012-05-10 NOTE — ED Notes (Signed)
Patient noted to have difficulty lifting left leg off of bed.

## 2012-05-11 ENCOUNTER — Emergency Department (HOSPITAL_COMMUNITY): Payer: Medicare Other

## 2012-05-11 ENCOUNTER — Emergency Department (HOSPITAL_COMMUNITY)
Admission: EM | Admit: 2012-05-11 | Discharge: 2012-05-11 | Disposition: A | Discharge status: Home / Self Care | Payer: Medicare Other | Attending: Emergency Medicine | Admitting: Emergency Medicine

## 2012-05-11 ENCOUNTER — Encounter (HOSPITAL_COMMUNITY): Payer: Self-pay | Admitting: Emergency Medicine

## 2012-05-11 DIAGNOSIS — Z8639 Personal history of other endocrine, nutritional and metabolic disease: Secondary | ICD-10-CM | POA: Insufficient documentation

## 2012-05-11 DIAGNOSIS — J4489 Other specified chronic obstructive pulmonary disease: Secondary | ICD-10-CM | POA: Insufficient documentation

## 2012-05-11 DIAGNOSIS — Z79899 Other long term (current) drug therapy: Secondary | ICD-10-CM | POA: Insufficient documentation

## 2012-05-11 DIAGNOSIS — I10 Essential (primary) hypertension: Secondary | ICD-10-CM | POA: Insufficient documentation

## 2012-05-11 DIAGNOSIS — R296 Repeated falls: Secondary | ICD-10-CM | POA: Insufficient documentation

## 2012-05-11 DIAGNOSIS — K219 Gastro-esophageal reflux disease without esophagitis: Secondary | ICD-10-CM | POA: Insufficient documentation

## 2012-05-11 DIAGNOSIS — E039 Hypothyroidism, unspecified: Secondary | ICD-10-CM | POA: Insufficient documentation

## 2012-05-11 DIAGNOSIS — S79929A Unspecified injury of unspecified thigh, initial encounter: Secondary | ICD-10-CM | POA: Insufficient documentation

## 2012-05-11 DIAGNOSIS — G8929 Other chronic pain: Secondary | ICD-10-CM

## 2012-05-11 DIAGNOSIS — Z862 Personal history of diseases of the blood and blood-forming organs and certain disorders involving the immune mechanism: Secondary | ICD-10-CM | POA: Insufficient documentation

## 2012-05-11 DIAGNOSIS — Z8739 Personal history of other diseases of the musculoskeletal system and connective tissue: Secondary | ICD-10-CM | POA: Insufficient documentation

## 2012-05-11 DIAGNOSIS — Y9210 Unspecified residential institution as the place of occurrence of the external cause: Secondary | ICD-10-CM | POA: Insufficient documentation

## 2012-05-11 DIAGNOSIS — J449 Chronic obstructive pulmonary disease, unspecified: Secondary | ICD-10-CM | POA: Insufficient documentation

## 2012-05-11 DIAGNOSIS — Z87440 Personal history of urinary (tract) infections: Secondary | ICD-10-CM | POA: Insufficient documentation

## 2012-05-11 DIAGNOSIS — S79919A Unspecified injury of unspecified hip, initial encounter: Secondary | ICD-10-CM | POA: Insufficient documentation

## 2012-05-11 DIAGNOSIS — Y9389 Activity, other specified: Secondary | ICD-10-CM | POA: Insufficient documentation

## 2012-05-11 LAB — POCT I-STAT, CHEM 8
Calcium, Ion: 1.14 mmol/L (ref 1.13–1.30)
Creatinine, Ser: 1.1 mg/dL (ref 0.50–1.10)
Glucose, Bld: 120 mg/dL — ABNORMAL HIGH (ref 70–99)
Hemoglobin: 9.5 g/dL — ABNORMAL LOW (ref 12.0–15.0)
Potassium: 4 mEq/L (ref 3.5–5.1)
TCO2: 23 mmol/L (ref 0–100)

## 2012-05-11 LAB — CBC WITH DIFFERENTIAL/PLATELET
Eosinophils Absolute: 0.1 10*3/uL (ref 0.0–0.7)
Eosinophils Relative: 1 % (ref 0–5)
HCT: 29 % — ABNORMAL LOW (ref 36.0–46.0)
Hemoglobin: 9.7 g/dL — ABNORMAL LOW (ref 12.0–15.0)
Lymphs Abs: 1.4 10*3/uL (ref 0.7–4.0)
MCH: 29.7 pg (ref 26.0–34.0)
MCV: 88.7 fL (ref 78.0–100.0)
Monocytes Absolute: 1.1 10*3/uL — ABNORMAL HIGH (ref 0.1–1.0)
Monocytes Relative: 12 % (ref 3–12)
RBC: 3.27 MIL/uL — ABNORMAL LOW (ref 3.87–5.11)

## 2012-05-11 MED ORDER — ACETAMINOPHEN 325 MG PO TABS
650.0000 mg | ORAL_TABLET | Freq: Once | ORAL | Status: AC
  Administered 2012-05-11: 650 mg via ORAL
  Filled 2012-05-11: qty 2

## 2012-05-11 NOTE — ED Notes (Signed)
[id number]<HQ> Expected date:<BR> Expected time:<BR> Means of arrival:<BR> Comments:<BR> Ems/ fall hip pain

## 2012-05-11 NOTE — ED Notes (Signed)
[facility] here for transport [employer]

## 2012-05-11 NOTE — ED Provider Notes (Signed)
History     CSN: [id number]  Arrival date & time 05/11/12  1153   First MD Initiated Contact with Patient 05/11/12 1340      Chief Complaint  Patient presents with  . Fall    (Consider location/radiation/quality/duration/timing/severity/associated sxs/prior treatment) HPI  79 year old female with history of Alzheimer who lives in a memory care is presents here for an evaluation of a fall. History was obtained through patient and through daughter who is at bedside. Per daughter, patient has been complaining of pain to her left hip ongoing for several months. Yesterday her nurse noticed that she was limping on the left side which prompted her to be brought to the ER for evaluation for possible stroke. She she had a normal head CT scan she was found to have a urinary tract infection. She was sent back to her nursing facility with antibiotic. This morning  the nursing staff noticed that she was on the ground next to her chair. It was a suspect fall. Patient complaining of left hip pain.  Pain is achy, nonradiating, wax and wane. Otherwise she denies hitting her head, loss of consciousness. She has no other complaint. She denies headache, neck pain, chest pain, shortness of breath, abdominal pain, back pain. She denies lightheadedness or dizziness. Patient is a poor historian. Most of the history was obtained through daughter who was at bedside.  Past Medical History  Diagnosis Date  . Unspecified hypothyroidism   . Disorder of bone and cartilage, unspecified   . Chronic airway obstruction, not elsewhere classified   . Unspecified essential hypertension   . Esophageal reflux   . Other and unspecified hyperlipidemia     Past Surgical History  Procedure Laterality Date  . Cataract extractions      bilateral  . Abdominal hysterectomy      total  . Bilateral salpingoopherectomy    . Rectal polyp removal      No malignancy identified    Family History  Problem Relation Age of Onset  .  Heart failure Mother   . Cancer Father     History  Substance Use Topics  . Smoking status: Never Smoker   . Smokeless tobacco: Never Used  . Alcohol Use: No    OB History   Grav Para Term Preterm Abortions TAB SAB Ect Mult Living                  Review of Systems  Constitutional:       10 Systems reviewed and all are negative for acute change except as noted in the HPI.     Allergies  Review of patient's allergies indicates no known allergies.  Home Medications   Current Outpatient Rx  Name  Route  Sig  Dispense  Refill  . acetaminophen (TYLENOL) 325 MG tablet   Oral   Take 650 mg by mouth every 6 (six) hours as needed for pain.         . Cranberry 500 MG CAPS   Oral   Take 500 mg by mouth every morning.          . diphenhydrAMINE (BENADRYL) 25 mg capsule   Oral   Take 25 mg by mouth at bedtime as needed. Allergies and sleep         . enalapril (VASOTEC) 2.5 MG tablet   Oral   Take 2.5 mg by mouth every morning.          . ferrous sulfate (FERROUSUL) 325 (65 FE) MG  tablet   Oral   Take 1 tablet (325 mg total) by mouth daily with breakfast.   30 tablet   11   . levothyroxine (SYNTHROID, LEVOTHROID) 25 MCG tablet   Oral   Take 25 mcg by mouth daily before breakfast.         . loratadine (CLARITIN) 10 MG tablet   Oral   Take 10 mg by mouth daily as needed. allergies         . mirtazapine (REMERON) 7.5 MG tablet   Oral   Take 7.5 mg by mouth at bedtime.         . Multiple Vitamin (MULTIVITAMIN WITH MINERALS) TABS   Oral   Take 1 tablet by mouth every morning.         Marland Kitchen omeprazole (PRILOSEC) 20 MG capsule   Oral   Take 40 mg by mouth every morning.          . ranitidine (ZANTAC) 150 MG tablet   Oral   Take 150 mg by mouth at bedtime. One hour after supper         . sulfamethoxazole-trimethoprim (BACTRIM,SEPTRA) 400-80 MG per tablet   Oral   Take 1 tablet by mouth every morning.           BP 124/53  Pulse 71  Resp  16  SpO2 98%  Physical Exam  Nursing note and vitals reviewed. Constitutional:  Thin-appearing white female who appears to be in no acute distress.  HENT:  Head: Normocephalic and atraumatic.  Mouth/Throat: Oropharynx is clear and moist.  Eyes: Conjunctivae are normal.  Neck: Normal range of motion. Neck supple.  Pulmonary/Chest: Effort normal and breath sounds normal.  Abdominal: Soft. There is no tenderness.  Musculoskeletal: She exhibits tenderness (Left hip: mild tenderness to lateral aspect without deformity, decreased ROM due to pain.  No knee or ankle pain .  brisk cap refill, no edema.  ).       Cervical back: Normal.       Thoracic back: Normal.       Lumbar back: Normal.  Neurological: She is alert.  Skin: Skin is warm. No rash noted.  Psychiatric: She has a normal mood and affect.    ED Course  Procedures (including critical care time)  3:56 PM Patient has chronic pain to her left hip and ongoing for several months. The x-ray today shows no evidence of fractures or dislocation. She was diagnosed with a urinary tract infection yesterday. She will continue with her antibiotic at a nursing facility. Tylenol for pain. Patient to followup with her primary care Dr. for further management. Strict return precautions discussed. All questions answered to the family's members satisfaction.  Care discussed with attending.  Labs Reviewed  CBC WITH DIFFERENTIAL - Abnormal; Notable for the following:    RBC 3.27 (*)    Hemoglobin 9.7 (*)    HCT 29.0 (*)    Monocytes Absolute 1.1 (*)    All other components within normal limits  POCT I-STAT, CHEM 8 - Abnormal; Notable for the following:    Sodium 134 (*)    BUN 25 (*)    Glucose, Bld 120 (*)    Hemoglobin 9.5 (*)    HCT 28.0 (*)    All other components within normal limits   Ct Head Wo Contrast  05/10/2012  *RADIOLOGY REPORT*  Clinical Data: Extremity weakness.  CT HEAD WITHOUT CONTRAST  Technique:  Contiguous axial images  were obtained from the base of the  skull through the vertex without contrast.  Comparison: 12/31/2011  Findings: There is diffuse cerebral atrophy and diffuse white matter disease in the periventricular and subcortical white matter. Again noted is an old infarct in the right basal ganglia region and possibly the right caudate.  There is no evidence for acute hemorrhage, mass lesion, midline shift or hydrocephalus.  The visualized sinuses are clear.  No acute bony abnormality.  IMPRESSION:  No acute intracranial abnormality.  Diffuse atrophy and white matter disease.  White matter disease likely represents chronic small vessel ischemic changes.  Old infarcts as described.   Original Report Authenticated By: Richarda Overlie, M.D.      1. Chronic left hip pain       MDM  BP 124/53  Pulse 71  Resp 16  SpO2 98%  I have reviewed nursing notes and vital signs. I personally reviewed the imaging tests through PACS system  I reviewed available ER/hospitalization records thought the EMR         Fayrene Helper, PA-C 05/11/12 1600

## 2012-05-11 NOTE — ED Notes (Addendum)
Pt from [facility] s/p fall while walking with her walker immobilized in route denies loc no blood thinner pt c/o lt hip pain

## 2012-05-11 NOTE — ED Provider Notes (Signed)
See prior note   Ward Givens, MD 05/11/12 1601

## 2012-05-11 NOTE — ED Provider Notes (Signed)
Patient had a fall at her nursing facility. Family reports they were concerned about her having had a stroke. Patient has been complaining of pain in her left hip for a while.  Patient's very hard of hearing. However she is able to cooperate for a neuro exam. She has no focal motor deficit in her upper or lower extremities. She appears to not have pain on range of motion of her left hip. Her face is symmetrical. She has no pronator drift. Her facial nerve is intact.  Medical screening examination/treatment/procedure(s) were conducted as a shared visit with non-physician practitioner(s) and myself.  I personally evaluated the patient during the encounter  Devoria Albe, MD, Franz Dell, MD 05/11/12 [phone]

## 2012-05-11 NOTE — Progress Notes (Signed)
CSW attempted to assess patient in room, pt currently out of room. CSW confirmed pt is resident at [facility]. CSW awaiting further medical evaluation to determine pt further disposition needs.   Catha Gosselin, LCSWA  [phone] .05/11/2012 1347pm

## 2012-05-13 ENCOUNTER — Telehealth (HOSPITAL_COMMUNITY): Payer: Self-pay | Admitting: Emergency Medicine

## 2012-05-13 LAB — URINE CULTURE: Colony Count: >=100000

## 2012-05-13 NOTE — ED Notes (Signed)
Chart reviewed by Elpidio Anis PA "Levofloxacin 500 mg, 1 po qd x 5 days, # 5"  Pt is a Resident at [facility].  Results & Rx faxed to [phone] [facility].

## 2012-05-13 NOTE — ED Notes (Signed)
Chart sent to EDP for review 

## 2012-05-14 ENCOUNTER — Telehealth (HOSPITAL_COMMUNITY): Payer: Self-pay | Admitting: Emergency Medicine

## 2012-05-15 ENCOUNTER — Telehealth (HOSPITAL_COMMUNITY): Payer: Self-pay | Admitting: Emergency Medicine

## 2012-05-15 NOTE — ED Notes (Signed)
Positive urnc- chart returned from EDP , Arthor Captain PA - no furhter tx necessary.

## 2012-05-18 ENCOUNTER — Telehealth: Payer: Self-pay

## 2012-05-18 NOTE — Telephone Encounter (Signed)
Ok to continue PT [employer]

## 2012-05-18 NOTE — Telephone Encounter (Signed)
Phone call from Onalee Hua, PT at [facility] [phone] is wanting a verbal order to continue PT for pt 2x/week for 2 weeks. She was evaluated today. Please advise.

## 2012-05-19 NOTE — Telephone Encounter (Signed)
Verbal authorization given to PT assistant Christiane Ha at [facility].

## 2012-05-22 DIAGNOSIS — F03918 Unspecified dementia, unspecified severity, with other behavioral disturbance: Secondary | ICD-10-CM

## 2012-05-22 DIAGNOSIS — J4489 Other specified chronic obstructive pulmonary disease: Secondary | ICD-10-CM

## 2012-05-22 DIAGNOSIS — J449 Chronic obstructive pulmonary disease, unspecified: Secondary | ICD-10-CM

## 2012-05-22 DIAGNOSIS — F0391 Unspecified dementia with behavioral disturbance: Secondary | ICD-10-CM

## 2012-05-22 DIAGNOSIS — I10 Essential (primary) hypertension: Secondary | ICD-10-CM

## 2012-05-22 DIAGNOSIS — N390 Urinary tract infection, site not specified: Secondary | ICD-10-CM

## 2012-05-23 ENCOUNTER — Emergency Department (HOSPITAL_COMMUNITY): Payer: Medicare Other

## 2012-05-23 ENCOUNTER — Emergency Department (HOSPITAL_COMMUNITY)
Admission: EM | Admit: 2012-05-23 | Discharge: 2012-05-23 | Disposition: A | Discharge status: Home / Self Care | Payer: Medicare Other | Attending: Emergency Medicine | Admitting: Emergency Medicine

## 2012-05-23 ENCOUNTER — Encounter (HOSPITAL_COMMUNITY): Payer: Self-pay

## 2012-05-23 DIAGNOSIS — K219 Gastro-esophageal reflux disease without esophagitis: Secondary | ICD-10-CM | POA: Insufficient documentation

## 2012-05-23 DIAGNOSIS — I10 Essential (primary) hypertension: Secondary | ICD-10-CM | POA: Insufficient documentation

## 2012-05-23 DIAGNOSIS — E785 Hyperlipidemia, unspecified: Secondary | ICD-10-CM | POA: Insufficient documentation

## 2012-05-23 DIAGNOSIS — F0390 Unspecified dementia without behavioral disturbance: Secondary | ICD-10-CM | POA: Insufficient documentation

## 2012-05-23 DIAGNOSIS — Z8739 Personal history of other diseases of the musculoskeletal system and connective tissue: Secondary | ICD-10-CM | POA: Insufficient documentation

## 2012-05-23 DIAGNOSIS — J4489 Other specified chronic obstructive pulmonary disease: Secondary | ICD-10-CM | POA: Insufficient documentation

## 2012-05-23 DIAGNOSIS — J449 Chronic obstructive pulmonary disease, unspecified: Secondary | ICD-10-CM | POA: Insufficient documentation

## 2012-05-23 DIAGNOSIS — Z79899 Other long term (current) drug therapy: Secondary | ICD-10-CM | POA: Insufficient documentation

## 2012-05-23 DIAGNOSIS — R55 Syncope and collapse: Secondary | ICD-10-CM | POA: Insufficient documentation

## 2012-05-23 DIAGNOSIS — R197 Diarrhea, unspecified: Secondary | ICD-10-CM | POA: Insufficient documentation

## 2012-05-23 DIAGNOSIS — R1110 Vomiting, unspecified: Secondary | ICD-10-CM | POA: Insufficient documentation

## 2012-05-23 DIAGNOSIS — E038 Other specified hypothyroidism: Secondary | ICD-10-CM | POA: Insufficient documentation

## 2012-05-23 LAB — URINALYSIS, MICROSCOPIC ONLY
Glucose, UA: NEGATIVE mg/dL
Leukocytes, UA: NEGATIVE
Protein, ur: NEGATIVE mg/dL
pH: 5.5 (ref 5.0–8.0)

## 2012-05-23 LAB — CBC WITH DIFFERENTIAL/PLATELET
Basophils Absolute: 0 10*3/uL (ref 0.0–0.1)
Eosinophils Absolute: 0 10*3/uL (ref 0.0–0.7)
HCT: 36.9 % (ref 36.0–46.0)
Lymphs Abs: 1.4 10*3/uL (ref 0.7–4.0)
MCHC: 32.5 g/dL (ref 30.0–36.0)
MCV: 89.3 fL (ref 78.0–100.0)
Neutro Abs: 4.7 10*3/uL (ref 1.7–7.7)
RDW: 13.2 % (ref 11.5–15.5)

## 2012-05-23 LAB — COMPREHENSIVE METABOLIC PANEL
Albumin: 3.3 g/dL — ABNORMAL LOW (ref 3.5–5.2)
BUN: 18 mg/dL (ref 6–23)
Creatinine, Ser: 0.97 mg/dL (ref 0.50–1.10)
Total Bilirubin: 0.2 mg/dL — ABNORMAL LOW (ref 0.3–1.2)
Total Protein: 6.1 g/dL (ref 6.0–8.3)

## 2012-05-23 LAB — GLUCOSE, CAPILLARY: Glucose-Capillary: 108 mg/dL — ABNORMAL HIGH (ref 70–99)

## 2012-05-23 MED ORDER — ONDANSETRON HCL 4 MG/2ML IJ SOLN
4.0000 mg | Freq: Once | INTRAMUSCULAR | Status: AC
  Administered 2012-05-23: 4 mg via INTRAVENOUS
  Filled 2012-05-23: qty 2

## 2012-05-23 MED ORDER — SODIUM CHLORIDE 0.9 % IV SOLN
INTRAVENOUS | Status: DC
  Administered 2012-05-23: 13:00:00 via INTRAVENOUS

## 2012-05-23 NOTE — ED Notes (Addendum)
Patient from [facility]. Was at dinner table when staff noticed her head bobbing back and forth. Per staff this is what her seizures look like. She does have a hx of seizures. After seizure patient had vomiting and diarrhea.  Patient answers questions appropriately. Pt is a DNR

## 2012-05-23 NOTE — ED Notes (Signed)
Seizure precautions intact.

## 2012-05-23 NOTE — ED Provider Notes (Signed)
History     CSN: [id number]  Arrival date & time 05/23/12  1033   First MD Initiated Contact with Patient 05/23/12 1038      Chief Complaint  Patient presents with  . Seizures  . Emesis    (Consider location/radiation/quality/duration/timing/severity/associated sxs/prior treatment) The history is provided by the patient and medical records. The history is limited by the condition of the patient.  [name] is a 79 y.o. female  that presents from [facility] after having a reported witnessed seizure described as head bobbing followed by emesis and diarrhea.  Nursing home reports that patient has a history of these types of events, however in reviewing patient's medical chart there is no documented history of seizure nor is patient on any antiepileptic medication.  Patient states that she is feeling "fine", however she is a level V caveat d/t dementia. Baseline mental status is unknown at this time.   Past Medical History  Diagnosis Date  . Unspecified hypothyroidism   . Disorder of bone and cartilage, unspecified   . Chronic airway obstruction, not elsewhere classified   . Unspecified essential hypertension   . Esophageal reflux   . Other and unspecified hyperlipidemia     Past Surgical History  Procedure Laterality Date  . Cataract extractions      bilateral  . Abdominal hysterectomy      total  . Bilateral salpingoopherectomy    . Rectal polyp removal      No malignancy identified    Family History  Problem Relation Age of Onset  . Heart failure Mother   . Cancer Father     History  Substance Use Topics  . Smoking status: Never Smoker   . Smokeless tobacco: Never Used  . Alcohol Use: No    OB History   Grav Para Term Preterm Abortions TAB SAB Ect Mult Living                  Review of Systems  Unable to perform ROS: Dementia    Allergies  Review of patient's allergies indicates no known allergies.  Home Medications   Current  Outpatient Rx  Name  Route  Sig  Dispense  Refill  . acetaminophen (TYLENOL) 500 MG tablet   Oral   Take 500 mg by mouth every 6 (six) hours as needed for pain.         . clotrimazole (LOTRIMIN) 1 % cream   Topical   Apply 1 application topically 2 (two) times daily as needed.         . Cranberry 500 MG CAPS   Oral   Take 500 mg by mouth every morning.          . diphenhydrAMINE (BENADRYL) 25 mg capsule   Oral   Take 25 mg by mouth at bedtime as needed. Allergies and sleep         . enalapril (VASOTEC) 2.5 MG tablet   Oral   Take 2.5 mg by mouth every morning.          . ferrous sulfate (FERROUSUL) 325 (65 FE) MG tablet   Oral   Take 1 tablet (325 mg total) by mouth daily with breakfast.   30 tablet   11   . levofloxacin (LEVAQUIN) 500 MG tablet   Oral   Take 500 mg by mouth daily. For 5 days         . levothyroxine (SYNTHROID, LEVOTHROID) 25 MCG tablet   Oral  Take 25 mcg by mouth daily before breakfast.         . loratadine (CLARITIN) 10 MG tablet   Oral   Take 10 mg by mouth daily as needed for allergies.         . mirtazapine (REMERON) 7.5 MG tablet   Oral   Take 7.5 mg by mouth at bedtime.         . Multiple Vitamin (MULTIVITAMIN WITH MINERALS) TABS   Oral   Take 1 tablet by mouth every morning.         Marland Kitchen omeprazole (PRILOSEC) 20 MG capsule   Oral   Take 40 mg by mouth every morning.          . ranitidine (ZANTAC) 150 MG tablet   Oral   Take 150 mg by mouth at bedtime. One hour after supper         . sulfamethoxazole-trimethoprim (BACTRIM,SEPTRA) 400-80 MG per tablet   Oral   Take 1 tablet by mouth every morning.           BP 134/61  Pulse 82  Temp(Src) 98.1 F (36.7 C) (Oral)  Resp 25  SpO2 98%  Physical Exam  Nursing note and vitals reviewed. Constitutional: She is oriented to person, place, and time. She appears well-developed and well-nourished. No distress.  HENT:  Head: Normocephalic and atraumatic.  No  tongue biting   Eyes: Conjunctivae and EOM are normal.  Neck: Normal range of motion.  Non tender spinal or paraspinal neck. Full normal pain free ROM  Cardiovascular:  RRR, intact distal pulses. No edema  Pulmonary/Chest: Effort normal.  Coughing on exam, LCAB, No acute respiratory distress.   Abdominal:  Soft Non tender w normal bowel sounds.    Musculoskeletal: Normal range of motion.  Neurological: She is alert and oriented to person, place, and time.  Alert. Oriented to self. Follows commands. Good coordination.   Skin: Skin is warm and dry. No rash noted. She is not diaphoretic.  Intact, no bruising.   Psychiatric: She has a normal mood and affect. Her behavior is normal.    ED Course  Procedures (including critical care time)   Labs Reviewed  GLUCOSE, CAPILLARY - Abnormal; Notable for the following:    Glucose-Capillary 108 (*)    All other components within normal limits  COMPREHENSIVE METABOLIC PANEL - Abnormal; Notable for the following:    Sodium 133 (*)    Glucose, Bld 118 (*)    Albumin 3.3 (*)    Total Bilirubin 0.2 (*)    GFR calc non Af Amer 50 (*)    GFR calc Af Amer 57 (*)    All other components within normal limits  URINE CULTURE  CBC WITH DIFFERENTIAL  URINALYSIS, MICROSCOPIC ONLY   Dg Chest 2 View  05/23/2012  *RADIOLOGY REPORT*  Clinical Data: Seizure.  Productive cough.  CHEST - 2 VIEW  Comparison: 12/03 13  Findings: Heart size appears within normal limits.  There is no effusion or edema.  Chronic interstitial coarsening is noted bilaterally.  Biapical scar like densities identified.  There is a small hiatal hernia noted.  Central airways appear thickened bilaterally. Within the upper thoracic spine and there is a chronic anterior wedge compression deformity.  This is unchanged from 08/01/2011.  IMPRESSION:  1.  No acute cardiopulmonary abnormalities. 2.  Chronic interstitial coarsening. 3.  Hiatal hernia.   Original Report Authenticated By: Signa Kell, M.D.    Family has arrived. Daughter states that  the pt is mentating at baseline and that she is not concerned with todays presentation. She describes her mother having vaso-vagal syncopal events followed by emesis when under stress (anxious or exhausted). She has about 2-4 episodes a year. Pt was just moved to the memory care center 6 weeks ago for worsening dementia and being a flight risk. This is the first episode since she changed care which is likely why EMS was called. CT head was ordered last week w no acute infiltrate after an evaluation for fall. Based on hx from daughter and pt mentating at baseline CT head canceled. Discussed with attending who agrees at this point.    No diagnosis found.    MDM  Vaso-vagal induced V/D.   79yo to ER for evaluation of "head bobbing" followed by an emesis and diarrhea episode. As stated above these episodes are typical for pt who appears to me at her mental baseline. Repeat abdominal exam prior to dc without any tenderness or peritoneal signs. Urine clean, culture sent to confirm. IVFs given and pt cleared to be dc back to [facility] home. This plan discussed with pt, her family/piower of attorney and the attending who all agree with disposition.         Jaci Carrel, [place] 05/23/12 1315

## 2012-05-23 NOTE — ED Provider Notes (Signed)
Medical screening examination/treatment/procedure(s) were performed by non-physician practitioner and as supervising physician I was immediately available for consultation/collaboration.   Lyanne Co, MD 05/23/12 1322

## 2012-05-23 NOTE — ED Notes (Signed)
112/63, 72, 99% on 2 LNC, resp 16. CBG 145 at [facility].

## 2012-05-23 NOTE — ED Notes (Signed)
YNW:GN56<OZ> Expected date:05/23/12<BR> Expected time:10:21 AM<BR> Means of arrival:Ambulance<BR> Comments:<BR> Post seizure

## 2012-05-24 LAB — URINE CULTURE

## 2012-05-26 ENCOUNTER — Other Ambulatory Visit: Payer: Self-pay

## 2012-05-26 MED ORDER — ENALAPRIL MALEATE 2.5 MG PO TABS: 2.5000 mg | ORAL_TABLET | Freq: Every morning | ORAL | Status: DC

## 2012-06-02 ENCOUNTER — Encounter: Payer: Self-pay | Admitting: Internal Medicine

## 2012-06-02 ENCOUNTER — Ambulatory Visit (INDEPENDENT_AMBULATORY_CARE_PROVIDER_SITE_OTHER): Payer: Medicare Other | Admitting: Internal Medicine

## 2012-06-02 ENCOUNTER — Other Ambulatory Visit: Payer: Self-pay

## 2012-06-02 VITALS — BP 120/62 | HR 74 | Temp 98.3°F | Wt 109.0 lb

## 2012-06-02 DIAGNOSIS — L989 Disorder of the skin and subcutaneous tissue, unspecified: Secondary | ICD-10-CM | POA: Insufficient documentation

## 2012-06-02 DIAGNOSIS — I10 Essential (primary) hypertension: Secondary | ICD-10-CM

## 2012-06-02 DIAGNOSIS — F0281 Dementia in other diseases classified elsewhere with behavioral disturbance: Secondary | ICD-10-CM

## 2012-06-02 DIAGNOSIS — R21 Rash and other nonspecific skin eruption: Secondary | ICD-10-CM

## 2012-06-02 DIAGNOSIS — F0280 Dementia in other diseases classified elsewhere without behavioral disturbance: Secondary | ICD-10-CM

## 2012-06-02 MED ORDER — SULFAMETHOXAZOLE-TRIMETHOPRIM 400-80 MG PO TABS: 1.0000 | ORAL_TABLET | Freq: Every morning | ORAL | Status: DC

## 2012-06-02 NOTE — Patient Instructions (Signed)
OK to continue the steroid cream for the right arm area OK to continue the lotrimin for the left thigh area, but consider the steroid cream if not better in 1 week.  OK to continue tylenol for the back pain, and the claritin for the allergies Your form was filled out today Please continue all other medications as before Your forms were filled out today

## 2012-06-02 NOTE — Assessment & Plan Note (Signed)
Severe, walks with walker with high risk fall, no improvement expected, ok for  [facility] form filled out today indicating need for home tx

## 2012-06-02 NOTE — Assessment & Plan Note (Signed)
Right arm c/w dermatitis, for cont'd steroid cream use,  to f/u any worsening symptoms or concerns

## 2012-06-02 NOTE — Assessment & Plan Note (Addendum)
stable overall by history and exam, recent data reviewed with pt, and pt to continue medical treatment as before,  to f/u any worsening symptoms or concerns  BP Readings from Last 3 Encounters:  06/02/12 120/62  05/23/12 133/61  05/11/12 105/46

## 2012-06-02 NOTE — Progress Notes (Signed)
Subjective:    Patient ID: [name], female    DOB: 12-15-1919, 79 y.o.   MRN: [id number]  HPI  Here to f/u as Dr Debby Bud is not available;  Pt continues to have recurring left LBP without change in severity, bowel or bladder change, fever, wt loss,  worsening LE pain/numbness/weakness, gait change or falls, though had an episode of near fall with pain apparently, and did have a minor fall with tylenol after that. Tends to sit and lie on the left side.  Had been tx with nsaid prior to GI bleed.Walks with walker, has hx of vagal episodes dizziness with vomiting, has happened several times over the yrs at the rest[facility]ant, worse with anxiety and fatigue.  Dementia overall stable symptomatically with gradual worsening at best, and not assoc with behavioral changes such as hallucinations, paranoia, or agitation, except for rare hallucination last yr. Seen in ER recently, no PNA but still some minor prod cough, no fever, and Pt denies chest pain, increased sob or doe, wheezing, orthopnea, PND, increased LE swelling, palpitations.  Last appt here about last October.  Needs form filled out for [facility] to qulaify for cont'd Home Health for being homebound. Does have several wks ongoing nasal allergy symptoms with clearish congestion, itch and sneezing, without fever. Past Medical History  Diagnosis Date  . Unspecified hypothyroidism   . Disorder of bone and cartilage, unspecified   . Chronic airway obstruction, not elsewhere classified   . Unspecified essential hypertension   . Esophageal reflux   . Other and unspecified hyperlipidemia    Past Surgical History  Procedure Laterality Date  . Cataract extractions      bilateral  . Abdominal hysterectomy      total  . Bilateral salpingoopherectomy    . Rectal polyp removal      No malignancy identified    reports that she has never smoked. She has never used smokeless tobacco. She reports that she does not drink alcohol or use illicit drugs. family  history includes Cancer in her father and Heart failure in her mother. No Known Allergies Current Outpatient Prescriptions on File Prior to Visit  Medication Sig Dispense Refill  . acetaminophen (TYLENOL) 500 MG tablet Take 500 mg by mouth every 6 (six) hours as needed for pain.      . clotrimazole (LOTRIMIN) 1 % cream Apply 1 application topically 2 (two) times daily as needed.      . Cranberry 500 MG CAPS Take 500 mg by mouth every morning.       . diphenhydrAMINE (BENADRYL) 25 mg capsule Take 25 mg by mouth at bedtime as needed. Allergies and sleep      . enalapril (VASOTEC) 2.5 MG tablet Take 1 tablet (2.5 mg total) by mouth every morning.  90 tablet  0  . ferrous sulfate (FERROUSUL) 325 (65 FE) MG tablet Take 1 tablet (325 mg total) by mouth daily with breakfast.  30 tablet  11  . levofloxacin (LEVAQUIN) 500 MG tablet Take 500 mg by mouth daily. For 5 days      . levothyroxine (SYNTHROID, LEVOTHROID) 25 MCG tablet Take 25 mcg by mouth daily before breakfast.      . loratadine (CLARITIN) 10 MG tablet Take 10 mg by mouth daily as needed for allergies.      . mirtazapine (REMERON) 7.5 MG tablet Take 7.5 mg by mouth at bedtime.      . Multiple Vitamin (MULTIVITAMIN WITH MINERALS) TABS Take 1 tablet by mouth every  morning.      Marland Kitchen omeprazole (PRILOSEC) 20 MG capsule Take 40 mg by mouth every morning.       . ranitidine (ZANTAC) 150 MG tablet Take 150 mg by mouth at bedtime. One hour after supper       No current facility-administered medications on file prior to visit.   Review of Systems  Constitutional: Negative for unexpected weight change, or unusual diaphoresis  HENT: Negative for tinnitus.   Eyes: Negative for photophobia and visual disturbance.  Respiratory: Negative for choking and stridor.   Gastrointestinal: Negative for vomiting and blood in stool.  Genitourinary: Negative for hematuria and decreased urine volume.  Musculoskeletal: Negative for acute joint swelling Skin:  Negative for color change and wound.  Neurological: Negative for tremors and numbness other than noted  Psychiatric/Behavioral: Negative for decreased concentration or  hyperactivity.      Objective:   Physical Exam BP 120/62  Pulse 74  Temp(Src) 98.3 F (36.8 C) (Oral)  Wt 109 lb (49.442 kg)  BMI 18.14 kg/m2  SpO2 92% VS noted, not ill appearing Constitutional: Pt appears well-developed and well-nourished.  HENT: Head: NCAT.  Right Ear: External ear normal.  Left Ear: External ear normal.  Bilat tm's with mild erythema.  Max sinus areas non tender.  Pharynx with mild erythema, no exudate Eyes: Conjunctivae and EOM are normal. Pupils are equal, round, and reactive to light.  Neck: Normal range of motion. Neck supple.  Cardiovascular: Normal rate and regular rhythm.   Pulmonary/Chest: Effort normal and breath sounds decreased, no rales or wheezing.  Neurological: Pt is alert. Motor gross intact Skin: Skin is warm; has patch approx 3x3 cm pruritic erythema,  Nontender; also 1.5 x1.5 cm area slightly raised scaly nonpruritic to left ant mid thigh  Psychiatric: Pt behavior is normal. Thought content c/w severe dementia     Assessment & Plan:

## 2012-06-02 NOTE — Assessment & Plan Note (Signed)
Different from arm rash, unclear etiology - ? Fungal vs other- for cont'd lotrimin but if not improved would benefit from derm

## 2012-06-04 ENCOUNTER — Telehealth: Payer: Self-pay

## 2012-06-04 NOTE — Telephone Encounter (Signed)
[name] from [facility] called to request tylenol 325 bid schedule instead of prn due to L hip pain and pt lack of ability to ask for medication when hurting. Nurse states that pt did much better through the day when tylenol was scheduled. Please advise on this [name] pt. Thanks

## 2012-06-04 NOTE — Telephone Encounter (Signed)
Ok for verbal 

## 2012-06-05 NOTE — Telephone Encounter (Signed)
[name] notified and will fax the order to be signed as well.

## 2012-06-18 ENCOUNTER — Other Ambulatory Visit: Payer: Self-pay | Admitting: Internal Medicine

## 2012-07-13 ENCOUNTER — Emergency Department (HOSPITAL_COMMUNITY): Payer: Medicare Other

## 2012-07-13 ENCOUNTER — Encounter (HOSPITAL_COMMUNITY): Payer: Self-pay

## 2012-07-13 ENCOUNTER — Emergency Department (HOSPITAL_COMMUNITY)
Admission: EM | Admit: 2012-07-13 | Discharge: 2012-07-13 | Disposition: A | Discharge status: Skilled Nursing Facility | Payer: Medicare Other | Attending: Emergency Medicine | Admitting: Emergency Medicine

## 2012-07-13 DIAGNOSIS — M899 Disorder of bone, unspecified: Secondary | ICD-10-CM | POA: Insufficient documentation

## 2012-07-13 DIAGNOSIS — Z79899 Other long term (current) drug therapy: Secondary | ICD-10-CM | POA: Insufficient documentation

## 2012-07-13 DIAGNOSIS — S6000XA Contusion of unspecified finger without damage to nail, initial encounter: Secondary | ICD-10-CM | POA: Insufficient documentation

## 2012-07-13 DIAGNOSIS — G3183 Dementia with Lewy bodies: Secondary | ICD-10-CM | POA: Insufficient documentation

## 2012-07-13 DIAGNOSIS — E039 Hypothyroidism, unspecified: Secondary | ICD-10-CM | POA: Insufficient documentation

## 2012-07-13 DIAGNOSIS — S42351A Displaced comminuted fracture of shaft of humerus, right arm, initial encounter for closed fracture: Secondary | ICD-10-CM

## 2012-07-13 DIAGNOSIS — S0191XA Laceration without foreign body of unspecified part of head, initial encounter: Secondary | ICD-10-CM

## 2012-07-13 DIAGNOSIS — I10 Essential (primary) hypertension: Secondary | ICD-10-CM | POA: Insufficient documentation

## 2012-07-13 DIAGNOSIS — Y92129 Unspecified place in nursing home as the place of occurrence of the external cause: Secondary | ICD-10-CM

## 2012-07-13 DIAGNOSIS — S42213A Unspecified displaced fracture of surgical neck of unspecified humerus, initial encounter for closed fracture: Secondary | ICD-10-CM | POA: Insufficient documentation

## 2012-07-13 DIAGNOSIS — W010XXA Fall on same level from slipping, tripping and stumbling without subsequent striking against object, initial encounter: Secondary | ICD-10-CM | POA: Insufficient documentation

## 2012-07-13 DIAGNOSIS — K219 Gastro-esophageal reflux disease without esophagitis: Secondary | ICD-10-CM | POA: Insufficient documentation

## 2012-07-13 DIAGNOSIS — S40019A Contusion of unspecified shoulder, initial encounter: Secondary | ICD-10-CM | POA: Insufficient documentation

## 2012-07-13 DIAGNOSIS — F0280 Dementia in other diseases classified elsewhere without behavioral disturbance: Secondary | ICD-10-CM | POA: Insufficient documentation

## 2012-07-13 DIAGNOSIS — W19XXXA Unspecified fall, initial encounter: Secondary | ICD-10-CM

## 2012-07-13 DIAGNOSIS — J449 Chronic obstructive pulmonary disease, unspecified: Secondary | ICD-10-CM | POA: Insufficient documentation

## 2012-07-13 DIAGNOSIS — Y9210 Unspecified residential institution as the place of occurrence of the external cause: Secondary | ICD-10-CM | POA: Insufficient documentation

## 2012-07-13 DIAGNOSIS — Z9849 Cataract extraction status, unspecified eye: Secondary | ICD-10-CM | POA: Insufficient documentation

## 2012-07-13 DIAGNOSIS — E785 Hyperlipidemia, unspecified: Secondary | ICD-10-CM | POA: Insufficient documentation

## 2012-07-13 DIAGNOSIS — J4489 Other specified chronic obstructive pulmonary disease: Secondary | ICD-10-CM | POA: Insufficient documentation

## 2012-07-13 DIAGNOSIS — Y9389 Activity, other specified: Secondary | ICD-10-CM | POA: Insufficient documentation

## 2012-07-13 MED ORDER — SODIUM CHLORIDE 0.9 % IV SOLN: 50.0000 ug/h | INTRAVENOUS | Status: DC

## 2012-07-13 MED ORDER — OXYCODONE-ACETAMINOPHEN 5-325 MG PO TABS: 1.0000 | ORAL_TABLET | ORAL | Status: DC | PRN

## 2012-07-13 MED ORDER — FENTANYL CITRATE 0.05 MG/ML IJ SOLN
50.0000 ug | Freq: Once | INTRAMUSCULAR | Status: AC
  Administered 2012-07-13: 50 ug via INTRAVENOUS
  Filled 2012-07-13: qty 2

## 2012-07-13 MED ORDER — OXYCODONE-ACETAMINOPHEN 5-325 MG PO TABS
1.0000 | ORAL_TABLET | Freq: Once | ORAL | Status: AC
  Administered 2012-07-13: 1 via ORAL
  Filled 2012-07-13: qty 1

## 2012-07-13 NOTE — ED Notes (Addendum)
Per EMS, Pt, from [facility], presents after an unwitnessed fall.  Pt c/o R shoulder pain, R hip pain and headache.  Pt has a "quarter sized" hematoma and skin tear above R eye.  No deformities noted.  Facility sts Pt is at neuro baseline.  Unknown if Pt lost consciousness.  Vitals are stable.  A & Ox1.  Pt given Fentanyl in route.

## 2012-07-13 NOTE — ED Provider Notes (Signed)
Medical screening examination/treatment/procedure(s) were conducted as a shared visit with non-physician practitioner(s) and myself.  I personally evaluated the patient during the encounter.  Right eyebrow lac, tender right shoulder, normal light touch of the right deltoid, decreased range of motion right shoulder due to pain, patient with capillary refill less than 2 seconds normal light touch able to move against resistance right hand in the distribution of the median, radial, and ulnar nerve function  Hurman Horn, MD 07/14/12 [phone]

## 2012-07-13 NOTE — Progress Notes (Signed)
CSW confirmed patient resident from [facility].   Catha Gosselin, LCSWA  [phone] .07/13/2012 1743pm

## 2012-07-13 NOTE — ED Notes (Addendum)
Patient transported to CT/XR ?

## 2012-07-13 NOTE — ED Notes (Signed)
Report given to Dianna at [facility]

## 2012-07-13 NOTE — ED Provider Notes (Signed)
History     CSN: [id number]  Arrival date & time 07/13/12  1655   First MD Initiated Contact with Patient 07/13/12 1729      Chief Complaint  Patient presents with  . Fall  . Head Laceration    (Consider location/radiation/quality/duration/timing/severity/associated sxs/prior treatment) HPI Pt is a 79yo female with hx of Lewie Body Dementia living at [facility] BIB EMS after unwitnessed fall.  Pt's daughter is with her and states the pt told her she "slipped and fell" while walking out of her bathroom.  Pt is at baseline neurologically per daughter.  Pt is c/o sudden onset right shoulder pain, worse with movement, bruising noted by pt's daughter.  Pt denies any chest, abdomen, hip, or lower extremity pain.  Pt is not on blood thinners.    Past Medical History  Diagnosis Date  . Unspecified hypothyroidism   . Disorder of bone and cartilage, unspecified   . Chronic airway obstruction, not elsewhere classified   . Unspecified essential hypertension   . Esophageal reflux   . Other and unspecified hyperlipidemia     Past Surgical History  Procedure Laterality Date  . Cataract extractions      bilateral  . Abdominal hysterectomy      total  . Bilateral salpingoopherectomy    . Rectal polyp removal      No malignancy identified    Family History  Problem Relation Age of Onset  . Heart failure Mother   . Cancer Father     History  Substance Use Topics  . Smoking status: Never Smoker   . Smokeless tobacco: Never Used  . Alcohol Use: No    OB History   Grav Para Term Preterm Abortions TAB SAB Ect Mult Living                  Review of Systems  Unable to perform ROS: Dementia  All other systems reviewed and are negative.    Allergies  Review of patient's allergies indicates no known allergies.  Home Medications   Current Outpatient Rx  Name  Route  Sig  Dispense  Refill  . acetaminophen (TYLENOL) 325 MG tablet   Oral   Take 650 mg by mouth every 6  (six) hours as needed for pain.         Marland Kitchen acetaminophen (TYLENOL) 325 MG tablet   Oral   Take 325 mg by mouth 2 (two) times daily.         . clotrimazole (LOTRIMIN) 1 % cream   Topical   Apply 1 application topically 2 (two) times daily as needed (for raised red areas).          . Cranberry 500 MG CAPS   Oral   Take 500 mg by mouth every morning.          . diphenhydrAMINE (BENADRYL) 25 mg capsule   Oral   Take 25 mg by mouth at bedtime as needed for allergies or sleep.          . enalapril (VASOTEC) 2.5 MG tablet   Oral   Take 1 tablet (2.5 mg total) by mouth every morning.   90 tablet   0     PATIENT IS DUE FOR A ROUTINE PHYSICAL   . ferrous sulfate 325 (65 FE) MG tablet   Oral   Take 325 mg by mouth daily with breakfast.         . levothyroxine (SYNTHROID, LEVOTHROID) 25 MCG tablet   Oral  Take 25 mcg by mouth daily before breakfast.         . loratadine (CLARITIN) 10 MG tablet   Oral   Take 10 mg by mouth daily as needed for allergies.         . mirtazapine (REMERON) 7.5 MG tablet   Oral   Take 7.5 mg by mouth at bedtime.         . Multiple Vitamin (MULTIVITAMIN WITH MINERALS) TABS   Oral   Take 1 tablet by mouth every morning.         Marland Kitchen omeprazole (PRILOSEC) 20 MG capsule   Oral   Take 40 mg by mouth every morning.          . ranitidine (ZANTAC) 150 MG tablet   Oral   Take 150 mg by mouth at bedtime. One hour after supper         . sulfamethoxazole-trimethoprim (BACTRIM,SEPTRA) 400-80 MG per tablet   Oral   Take 1 tablet by mouth every morning.   30 tablet   5   . oxyCODONE-acetaminophen (PERCOCET/ROXICET) 5-325 MG per tablet   Oral   Take 1 tablet by mouth every 4 (four) hours as needed for pain (Take 1 pill by mouth every 4-6 hours as needed for pain.).   30 tablet   0     BP 136/59  Pulse 71  Temp(Src) 98 F (36.7 C) (Oral)  Resp 22  SpO2 100%  Physical Exam  Nursing note and vitals reviewed. Constitutional:  She appears lethargic. No distress.  Pt appears stated age, frail appearing, visible lac above right eye.  No acute distress.   HENT:  Head: Normocephalic.    Mouth/Throat: Oropharynx is clear and moist. No oropharyngeal exudate.  Eyes: Conjunctivae and EOM are normal. Pupils are equal, round, and reactive to light. No scleral icterus.  Neck: Normal range of motion. Neck supple.  Cardiovascular: Normal rate, regular rhythm and normal heart sounds.   Pulmonary/Chest: Effort normal and breath sounds normal. No respiratory distress. She has no wheezes. She has no rales.  Abdominal: Soft. Bowel sounds are normal. She exhibits no distension and no mass. There is no tenderness. There is no rebound and no guarding.  Musculoskeletal: She exhibits tenderness. She exhibits no edema.       Right shoulder: She exhibits decreased range of motion, tenderness and bony tenderness. She exhibits normal pulse.       Left hand: She exhibits tenderness and bony tenderness ( MCP of thumb).  Pt hesitant to move right shoulder due to pain. Able to fully flex and extend right elbow, non-TTP. CMS of right hand in tact, nl sensation, cap refill <2sec.  Neurological: She appears lethargic. No cranial nerve deficit.  Pt is demented at baseline and hard of hearing. Able to follow basic commands.   Skin: Skin is warm and dry. Ecchymosis ( right shoulder, left thumb) and laceration ( 2cm superior lateral to right eye) noted. She is not diaphoretic.    ED Course  LACERATION REPAIR Date/Time: 07/13/2012 6:35 PM Performed by: Junius Finner Authorized by: Junius Finner Consent: Verbal consent obtained. Risks and benefits: risks, benefits and alternatives were discussed Consent given by: patient and power of attorney Patient understanding: patient states understanding of the procedure being performed Patient consent: the patient's understanding of the procedure matches consent given Patient identity confirmed: arm  band Body area: head/neck Location details: right eyebrow Laceration length: 3 cm Foreign bodies: no foreign bodies Tendon involvement: none Nerve involvement:  none Vascular damage: no Irrigation solution: saline Irrigation method: syringe Amount of cleaning: standard Debridement: none Degree of undermining: none Approximation: close Approximation difficulty: simple Comments: Lac closed with dermabond    (including critical care time)  Labs Reviewed - No data to display Dg Shoulder Right  07/13/2012   *RADIOLOGY REPORT*  Clinical Data: Post fall, now with right shoulder pain  RIGHT SHOULDER - 2+ VIEW  Comparison: None.  Findings: There is a slightly comminuted impaction fracture involving the surgical neck of the proximal humeral metaphysis. There is minimal angulation, apex ventral with approximately 2.1 cm of foreshortening. There is a tiny (approximately 8 mm loose body within the anterior aspect of the glenohumeral joint space.  No definite intra-articular extension.  No definite dislocation.  No radiopaque foreign body.  Limited visualization of the adjacent thorax is normal.  IMPRESSION: Slightly comminuted impaction fracture involving the surgical neck of the humeral metaphysis.   Original Report Authenticated By: Tacey Ruiz, MD   Ct Head Wo Contrast  07/13/2012   *RADIOLOGY REPORT*  Clinical Data:  79 year old female status post fall with abrasion, laceration, neck pain.  CT HEAD WITHOUT CONTRAST CT CERVICAL SPINE WITHOUT CONTRAST  Technique:  Multidetector CT imaging of the head and cervical spine was performed following the standard protocol without intravenous contrast.  Multiplanar CT image reconstructions of the cervical spine were also generated.  Comparison:  Head CTs 05/10/2012 and earlier.  CT HEAD  Findings: Stable postoperative appearance of the orbit soft tissues.  No scalp hematoma identified.  Hyperostosis of the calvarium.  The calvarium appears intact. Visualized  paranasal sinuses and mastoids are clear.  Calcified atherosclerosis at the skull base.  Stable confluent cerebral white matter hypodensity in conjunction with a chronic lacunar infarct of the right corona radiata/external capsule.  No ventriculomegaly. No midline shift, mass effect, or evidence of mass lesion.  No evidence of cortically based acute infarction identified.  No acute intracranial hemorrhage identified.  No suspicious intracranial vascular hyperdensity.  IMPRESSION: 1. No acute intracranial abnormality. 2.  Stable chronic small vessel disease. 3.  Cervical spine findings are below.  CT CERVICAL SPINE  Findings: Osteopenia.  Preserved cervical lordosis. Cervicothoracic junction alignment is within normal limits.  Degenerative ligamentous hypertrophy about the odontoid. Visualized skull base is intact.  No atlanto-occipital dissociation.  Bilateral posterior element alignment is within normal limits.  Multilevel facet and disc degeneration.  No acute cervical spine fracture identified. Mild scoliosis is evident.  Grossly intact visualized upper thoracic levels.  Negative lung apices.  Calcified carotid atherosclerosis in the neck.  Other Visualized paraspinal soft tissues are within normal limits.  IMPRESSION: 1. No acute fracture or listhesis identified in the cervical spine. Ligamentous injury is not excluded. 2.  Osteopenia and widespread cervical degenerative changes.   Original Report Authenticated By: Erskine Speed, M.D.   Ct Cervical Spine Wo Contrast  07/13/2012   *RADIOLOGY REPORT*  Clinical Data:  79 year old female status post fall with abrasion, laceration, neck pain.  CT HEAD WITHOUT CONTRAST CT CERVICAL SPINE WITHOUT CONTRAST  Technique:  Multidetector CT imaging of the head and cervical spine was performed following the standard protocol without intravenous contrast.  Multiplanar CT image reconstructions of the cervical spine were also generated.  Comparison:  Head CTs 05/10/2012 and  earlier.  CT HEAD  Findings: Stable postoperative appearance of the orbit soft tissues.  No scalp hematoma identified.  Hyperostosis of the calvarium.  The calvarium appears intact. Visualized paranasal sinuses and mastoids are  clear.  Calcified atherosclerosis at the skull base.  Stable confluent cerebral white matter hypodensity in conjunction with a chronic lacunar infarct of the right corona radiata/external capsule.  No ventriculomegaly. No midline shift, mass effect, or evidence of mass lesion.  No evidence of cortically based acute infarction identified.  No acute intracranial hemorrhage identified.  No suspicious intracranial vascular hyperdensity.  IMPRESSION: 1. No acute intracranial abnormality. 2.  Stable chronic small vessel disease. 3.  Cervical spine findings are below.  CT CERVICAL SPINE  Findings: Osteopenia.  Preserved cervical lordosis. Cervicothoracic junction alignment is within normal limits.  Degenerative ligamentous hypertrophy about the odontoid. Visualized skull base is intact.  No atlanto-occipital dissociation.  Bilateral posterior element alignment is within normal limits.  Multilevel facet and disc degeneration.  No acute cervical spine fracture identified. Mild scoliosis is evident.  Grossly intact visualized upper thoracic levels.  Negative lung apices.  Calcified carotid atherosclerosis in the neck.  Other Visualized paraspinal soft tissues are within normal limits.  IMPRESSION: 1. No acute fracture or listhesis identified in the cervical spine. Ligamentous injury is not excluded. 2.  Osteopenia and widespread cervical degenerative changes.   Original Report Authenticated By: Erskine Speed, M.D.   Dg Finger Thumb Left  07/13/2012   *RADIOLOGY REPORT*  Clinical Data: Post fall, now with laceration to the thumb and associated bruising  LEFT THUMB 2+V  Comparison: None.  Findings: Osteopenia.  No displaced fracture.  No dislocation. Joint spaces are grossly preserved.  No definite  erosions.  There is an indeterminate linear approximately 2 mm opacity within the dorsal soft tissues adjacent to the nail bed best on the lateral radiograph. Regional soft tissues otherwise normal.  There is a peripherally sclerotic approximately 0.6 x 0.5 cm lucency involving the ulnar-palmar aspect of the cortex tuft of the thumb. This finding without associated soft tissue mass.  IMPRESSION: 1.  Osteopenia without definite fracture.  2. Indeterminate linear approximately 2 mm opacity overlying the soft tissues regional to the nail bed of the thumb.  Correlation for point tenderness at this location is recommended.  3. Incidental note made of an indeterminate but benign appearing lucent lesion involving the cortex of the ulnar/palmar aspect of the thumb, likely representing an enchondroma versus an intraosseous epidermoid cyst.   Original Report Authenticated By: Tacey Ruiz, MD     Date: 07/13/2012  Rate: 73  Rhythm: normal sinus rhythm  QRS Axis: normal  Intervals: normal  ST/T Wave abnormalities: normal  Conduction Disutrbances:none  Narrative Interpretation:   Old EKG Reviewed: unchanged    1. Comminuted fracture of humerus, right, closed, initial encounter   2. Laceration of head, initial encounter   3. Fall at nursing home, initial encounter       MDM  Concern for intracranial bleed, right shoulder dislocation or fracture and left thumb dislocation or fx.  Head laceration repaired with dermabond, see procedure note. Pt's mentation is at baseline per daughter.   Head CT: no acute intracranial abnormality, stable chronic small vessel disease CT cervical spine: no acute fx or listhesis identified in cervical spine. Ligamentous injury is not excluded. Osteopenia and widespread cervical degenerative changes. Right Shoulder: slightly comminuted impaction fracture involving surgical neck of humeral metaphysis Left thumb: osteopenia w/o definite fx   Rx: percocet and placed right arm  in sling.  Advised family that pt may need to be placed in wheelchair at [facility] until right arm heals enough for her to use her walker. F/u with Dr.  Hewitt, orthopedics, in 1 week for recheck of right shoulder. Return precautions given. Pt's family verbalized understanding and agreement with tx plan. Vitals: unremarkable. Discharged in stable condition. Vitals: unremarkable.  Discussed pt with Dr. Fonnie Jarvis during ED encounter.        Junius Finner, PA-C 07/13/12 2039  Junius Finner, PA-C 07/13/12 2041

## 2012-07-13 NOTE — ED Notes (Addendum)
Pt's daughter sts she was told that Pt "slipped and fell" while she was walking out of her bathroom.  Sts Pt is at Neuro baseline.

## 2012-07-13 NOTE — ED Notes (Signed)
Attempted to call [facility], no one answering phone, family made aware, [facility] called to transport pt back.

## 2012-07-13 NOTE — ED Notes (Signed)
[id number]<AV> Expected date:07/13/12<BR> Expected time: 4:50 PM<BR> Means of arrival:<BR> Comments:<BR> 79 year old fall- laceration

## 2012-07-18 ENCOUNTER — Encounter (HOSPITAL_COMMUNITY): Payer: Self-pay | Admitting: *Deleted

## 2012-07-18 ENCOUNTER — Emergency Department (HOSPITAL_COMMUNITY): Payer: Medicare Other

## 2012-07-18 ENCOUNTER — Emergency Department (HOSPITAL_COMMUNITY)
Admission: EM | Admit: 2012-07-18 | Discharge: 2012-07-19 | Disposition: A | Discharge status: Skilled Nursing Facility | Payer: Medicare Other | Attending: Emergency Medicine | Admitting: Emergency Medicine

## 2012-07-18 DIAGNOSIS — Y9210 Unspecified residential institution as the place of occurrence of the external cause: Secondary | ICD-10-CM | POA: Insufficient documentation

## 2012-07-18 DIAGNOSIS — Z79899 Other long term (current) drug therapy: Secondary | ICD-10-CM | POA: Insufficient documentation

## 2012-07-18 DIAGNOSIS — Z8781 Personal history of (healed) traumatic fracture: Secondary | ICD-10-CM | POA: Insufficient documentation

## 2012-07-18 DIAGNOSIS — K219 Gastro-esophageal reflux disease without esophagitis: Secondary | ICD-10-CM | POA: Insufficient documentation

## 2012-07-18 DIAGNOSIS — Z8739 Personal history of other diseases of the musculoskeletal system and connective tissue: Secondary | ICD-10-CM | POA: Insufficient documentation

## 2012-07-18 DIAGNOSIS — J449 Chronic obstructive pulmonary disease, unspecified: Secondary | ICD-10-CM | POA: Insufficient documentation

## 2012-07-18 DIAGNOSIS — S4980XA Other specified injuries of shoulder and upper arm, unspecified arm, initial encounter: Secondary | ICD-10-CM | POA: Insufficient documentation

## 2012-07-18 DIAGNOSIS — E039 Hypothyroidism, unspecified: Secondary | ICD-10-CM | POA: Insufficient documentation

## 2012-07-18 DIAGNOSIS — N390 Urinary tract infection, site not specified: Secondary | ICD-10-CM

## 2012-07-18 DIAGNOSIS — Z862 Personal history of diseases of the blood and blood-forming organs and certain disorders involving the immune mechanism: Secondary | ICD-10-CM | POA: Insufficient documentation

## 2012-07-18 DIAGNOSIS — S0003XA Contusion of scalp, initial encounter: Secondary | ICD-10-CM | POA: Insufficient documentation

## 2012-07-18 DIAGNOSIS — S46909A Unspecified injury of unspecified muscle, fascia and tendon at shoulder and upper arm level, unspecified arm, initial encounter: Secondary | ICD-10-CM | POA: Insufficient documentation

## 2012-07-18 DIAGNOSIS — J4489 Other specified chronic obstructive pulmonary disease: Secondary | ICD-10-CM | POA: Insufficient documentation

## 2012-07-18 DIAGNOSIS — Z8639 Personal history of other endocrine, nutritional and metabolic disease: Secondary | ICD-10-CM | POA: Insufficient documentation

## 2012-07-18 DIAGNOSIS — W19XXXA Unspecified fall, initial encounter: Secondary | ICD-10-CM

## 2012-07-18 DIAGNOSIS — W06XXXA Fall from bed, initial encounter: Secondary | ICD-10-CM | POA: Insufficient documentation

## 2012-07-18 DIAGNOSIS — I10 Essential (primary) hypertension: Secondary | ICD-10-CM | POA: Insufficient documentation

## 2012-07-18 DIAGNOSIS — Y9389 Activity, other specified: Secondary | ICD-10-CM | POA: Insufficient documentation

## 2012-07-18 LAB — URINALYSIS, ROUTINE W REFLEX MICROSCOPIC
Bilirubin Urine: NEGATIVE
Glucose, UA: NEGATIVE mg/dL
Ketones, ur: NEGATIVE mg/dL
Nitrite: NEGATIVE
Protein, ur: NEGATIVE mg/dL
Specific Gravity, Urine: 1.014 (ref 1.005–1.030)
Urobilinogen, UA: 0.2 mg/dL (ref 0.0–1.0)
pH: 6 (ref 5.0–8.0)

## 2012-07-18 LAB — POCT I-STAT, CHEM 8
BUN: 32 mg/dL — ABNORMAL HIGH (ref 6–23)
Calcium, Ion: 1.17 mmol/L (ref 1.13–1.30)
Chloride: 103 mEq/L (ref 96–112)
Creatinine, Ser: 1.2 mg/dL — ABNORMAL HIGH (ref 0.50–1.10)
Glucose, Bld: 114 mg/dL — ABNORMAL HIGH (ref 70–99)
HCT: 29 % — ABNORMAL LOW (ref 36.0–46.0)
Hemoglobin: 9.9 g/dL — ABNORMAL LOW (ref 12.0–15.0)
Potassium: 4 mEq/L (ref 3.5–5.1)
Sodium: 137 mEq/L (ref 135–145)
TCO2: 23 mmol/L (ref 0–100)

## 2012-07-18 LAB — URINE MICROSCOPIC-ADD ON

## 2012-07-18 MED ORDER — CIPROFLOXACIN HCL 250 MG PO TABS: 250.0000 mg | ORAL_TABLET | Freq: Two times a day (BID) | ORAL | Status: DC

## 2012-07-18 MED ORDER — CIPROFLOXACIN HCL 500 MG PO TABS
500.0000 mg | ORAL_TABLET | Freq: Once | ORAL | Status: AC
  Administered 2012-07-19: 500 mg via ORAL
  Filled 2012-07-18: qty 1

## 2012-07-18 NOTE — ED Provider Notes (Signed)
History    79 year old female presenting after a fall. Unwitnessed fall at nursing home. Patient is demented and unreliable historian, she states that she fell beside her bed though. She is unsure she hit her head or not. She's complaining of pain in her right shoulder. Patient was recently evaluated a few days ago after another fall. She sustained a right proximal humerus fracture at that time. Daughter is at bedside. She reports that patient's is at her baseline.   CSN: [id number]  Arrival date & time 07/18/12  2118   First MD Initiated Contact with Patient 07/18/12 2129      Chief Complaint  Patient presents with  . Fall    (Consider location/radiation/quality/duration/timing/severity/associated sxs/prior treatment) HPI  Past Medical History  Diagnosis Date  . Unspecified hypothyroidism   . Disorder of bone and cartilage, unspecified   . Chronic airway obstruction, not elsewhere classified   . Unspecified essential hypertension   . Esophageal reflux   . Other and unspecified hyperlipidemia     Past Surgical History  Procedure Laterality Date  . Cataract extractions      bilateral  . Abdominal hysterectomy      total  . Bilateral salpingoopherectomy    . Rectal polyp removal      No malignancy identified    Family History  Problem Relation Age of Onset  . Heart failure Mother   . Cancer Father     History  Substance Use Topics  . Smoking status: Never Smoker   . Smokeless tobacco: Never Used  . Alcohol Use: No    OB History   Grav Para Term Preterm Abortions TAB SAB Ect Mult Living                  Review of Systems  All systems reviewed and negative, other than as noted in HPI.   Allergies  Review of patient's allergies indicates no known allergies.  Home Medications   Current Outpatient Rx  Name  Route  Sig  Dispense  Refill  . acetaminophen (TYLENOL) 325 MG tablet   Oral   Take 650 mg by mouth every 6 (six) hours as needed for pain.          Marland Kitchen acetaminophen (TYLENOL) 325 MG tablet   Oral   Take 325 mg by mouth 2 (two) times daily.         . clotrimazole (LOTRIMIN) 1 % cream   Topical   Apply 1 application topically 2 (two) times daily as needed (for raised red areas).          . Cranberry 500 MG CAPS   Oral   Take 500 mg by mouth every morning.          . diphenhydrAMINE (BENADRYL) 25 mg capsule   Oral   Take 25 mg by mouth at bedtime as needed for allergies or sleep.          . enalapril (VASOTEC) 2.5 MG tablet   Oral   Take 1 tablet (2.5 mg total) by mouth every morning.   90 tablet   0     PATIENT IS DUE FOR A ROUTINE PHYSICAL   . ferrous sulfate 325 (65 FE) MG tablet   Oral   Take 325 mg by mouth daily with breakfast.         . levothyroxine (SYNTHROID, LEVOTHROID) 25 MCG tablet   Oral   Take 25 mcg by mouth daily before breakfast.         .  loratadine (CLARITIN) 10 MG tablet   Oral   Take 10 mg by mouth daily as needed for allergies.         . mirtazapine (REMERON) 7.5 MG tablet   Oral   Take 7.5 mg by mouth at bedtime.         . Multiple Vitamin (MULTIVITAMIN WITH MINERALS) TABS   Oral   Take 1 tablet by mouth every morning.         Marland Kitchen omeprazole (PRILOSEC) 20 MG capsule   Oral   Take 40 mg by mouth every morning.          Marland Kitchen oxyCODONE-acetaminophen (PERCOCET/ROXICET) 5-325 MG per tablet   Oral   Take 1 tablet by mouth every 4 (four) hours as needed for pain (Take 1 pill by mouth every 4-6 hours as needed for pain.).   30 tablet   0   . ranitidine (ZANTAC) 150 MG tablet   Oral   Take 150 mg by mouth at bedtime. One hour after supper         . sulfamethoxazole-trimethoprim (BACTRIM,SEPTRA) 400-80 MG per tablet   Oral   Take 1 tablet by mouth every morning.   30 tablet   5     BP 144/60  Pulse 90  Temp(Src) 98.3 F (36.8 C) (Oral)  Resp 18  SpO2 94%  Physical Exam  Nursing note and vitals reviewed. Constitutional: She appears well-developed and  well-nourished. No distress.  HENT:  Head: Normocephalic.  Scalp lesion to right for head with bruising.  Eyes: Conjunctivae are normal. Right eye exhibits no discharge. Left eye exhibits no discharge.  Neck: Neck supple.  Cardiovascular: Normal rate, regular rhythm and normal heart sounds.  Exam reveals no gallop and no friction rub.   No murmur heard. Pulmonary/Chest: Effort normal and breath sounds normal. No respiratory distress.  Abdominal: Soft. She exhibits no distension. There is no tenderness.  Musculoskeletal: She exhibits tenderness.  Right upper extremity in sling. Extensive ecchymosis to proximal right arm and shoulder. Tenderness to palpation in the same distribution. Unable to range secondary to pain.  Neurological: She is alert. No cranial nerve deficit.  Mild global weakness. No focal deficits noted. Patient unable to stand at bedside unassisted.  Skin: Skin is warm and dry.  Psychiatric: She has a normal mood and affect. Her behavior is normal. Thought content normal.    ED Course  Procedures (including critical care time)  Labs Reviewed  URINALYSIS, ROUTINE W REFLEX MICROSCOPIC - Abnormal; Notable for the following:    APPearance TURBID (*)    Hgb urine dipstick TRACE (*)    Leukocytes, UA LARGE (*)    All other components within normal limits  URINE MICROSCOPIC-ADD ON - Abnormal; Notable for the following:    Bacteria, UA MANY (*)    All other components within normal limits  POCT I-STAT, CHEM 8 - Abnormal; Notable for the following:    BUN 32 (*)    Creatinine, Ser 1.20 (*)    Glucose, Bld 114 (*)    Hemoglobin 9.9 (*)    HCT 29.0 (*)    All other components within normal limits  URINE CULTURE   Ct Head Wo Contrast  07/18/2012   *RADIOLOGY REPORT*  Clinical Data: [place] Seat.  Hit head.  Headache.  CT HEAD WITHOUT CONTRAST  Technique:  Contiguous axial images were obtained from the base of the skull through the vertex without contrast.  Comparison: 07/13/2012.   Findings: Stable age related cerebral atrophy,  ventriculomegaly and periventricular white matter disease.  Remote lacunar type basal ganglia infarct is noted on the right.  No extra-axial fluid collections.  No CT findings for acute hemispheric infarction and/or intracranial hemorrhage.  No mass lesions.  The brainstem and cerebellum grossly normal and stable.  No acute skull fracture.  Moderate hyperostosis frontalis interna. The paranasal sinuses mastoid air cells are clear.  The globes are intact.  IMPRESSION:  1.  Stable age related cerebral atrophy, ventriculomegaly and periventricular white matter disease. 2.  No acute intracranial findings or skull fracture.   Original Report Authenticated By: Rudie Meyer, M.D.   EKG:  Rhythm: normal sinus rhythm. Poor baseline.  Vent. rate 88 BPM PR interval 156 ms QRS duration 88 ms QT/QTc 372/450 ms ST segments: NS ST changes   1. Fall, initial encounter   2. UTI (urinary tract infection)       MDM  89yF with fall, circumstances of which are unclear. UA consistent with UTI. Med list with bactrim daily. Prophylactic? Indication not clear or if actually a current med. Family not aware. Will prescribed cipro. Patient with evidence of head trauma which appears to be subacute. Patient did have a recent fall and workup significant for a right proximal humerus fracture. CT of her head did not show any serious traumatic injury at that time. She has a reported baseline per her daughter. Repeat head CT today does not show any acute abnormality. She is stable for discharge back to nursing facility.        Raeford Razor, MD 07/25/12 [id number]

## 2012-07-18 NOTE — ED Notes (Signed)
Per EMS report: pt from [facility]: Pt was here last week for the same complaint of fall.  This evening pt was getting up to use the bathroom and while squatting, pt lost her balance and fell.  Pt denies loc. Pt denies any new pain and EMS did not note any new injuries.  On arrival to ER: pt is a/o x 4, skin warm and dry.  Pt is hard of hearing. EMS VS: BP: 142/70, HR: 84, RR: 12

## 2012-07-18 NOTE — ED Notes (Signed)
[id number]<NW> Expected date:<BR> Expected time:<BR> Means of arrival:<BR> Comments:<BR> RM 20: Fall, elderly, arm pain

## 2012-07-19 NOTE — ED Notes (Signed)
[facility] called for transport.  

## 2012-07-20 LAB — URINE CULTURE
Colony Count: NO GROWTH
Culture: NO GROWTH

## 2012-07-21 ENCOUNTER — Encounter (HOSPITAL_COMMUNITY): Payer: Self-pay | Admitting: Emergency Medicine

## 2012-07-21 ENCOUNTER — Emergency Department (HOSPITAL_COMMUNITY)
Admission: EM | Admit: 2012-07-21 | Discharge: 2012-07-21 | Disposition: A | Discharge status: Home / Self Care | Payer: Medicare Other | Attending: Emergency Medicine | Admitting: Emergency Medicine

## 2012-07-21 ENCOUNTER — Emergency Department (HOSPITAL_COMMUNITY): Payer: Medicare Other

## 2012-07-21 DIAGNOSIS — I10 Essential (primary) hypertension: Secondary | ICD-10-CM | POA: Insufficient documentation

## 2012-07-21 DIAGNOSIS — K219 Gastro-esophageal reflux disease without esophagitis: Secondary | ICD-10-CM | POA: Insufficient documentation

## 2012-07-21 DIAGNOSIS — Z8739 Personal history of other diseases of the musculoskeletal system and connective tissue: Secondary | ICD-10-CM | POA: Insufficient documentation

## 2012-07-21 DIAGNOSIS — Y929 Unspecified place or not applicable: Secondary | ICD-10-CM | POA: Insufficient documentation

## 2012-07-21 DIAGNOSIS — E039 Hypothyroidism, unspecified: Secondary | ICD-10-CM | POA: Insufficient documentation

## 2012-07-21 DIAGNOSIS — Y939 Activity, unspecified: Secondary | ICD-10-CM | POA: Insufficient documentation

## 2012-07-21 DIAGNOSIS — W19XXXA Unspecified fall, initial encounter: Secondary | ICD-10-CM

## 2012-07-21 DIAGNOSIS — E785 Hyperlipidemia, unspecified: Secondary | ICD-10-CM | POA: Insufficient documentation

## 2012-07-21 DIAGNOSIS — S0990XA Unspecified injury of head, initial encounter: Secondary | ICD-10-CM | POA: Insufficient documentation

## 2012-07-21 DIAGNOSIS — Z79899 Other long term (current) drug therapy: Secondary | ICD-10-CM | POA: Insufficient documentation

## 2012-07-21 DIAGNOSIS — J449 Chronic obstructive pulmonary disease, unspecified: Secondary | ICD-10-CM | POA: Insufficient documentation

## 2012-07-21 DIAGNOSIS — J4489 Other specified chronic obstructive pulmonary disease: Secondary | ICD-10-CM | POA: Insufficient documentation

## 2012-07-21 DIAGNOSIS — W050XXA Fall from non-moving wheelchair, initial encounter: Secondary | ICD-10-CM | POA: Insufficient documentation

## 2012-07-21 NOTE — ED Notes (Signed)
Per EMS: pt fell out of wheelchair today and hit head against chair where wooden part was, landing on arm that is in sling already from previous fall.

## 2012-07-21 NOTE — ED Provider Notes (Signed)
History    CSN: [id number] Arrival date & time 07/21/12  1100  First MD Initiated Contact with Patient 07/21/12 1123     Chief Complaint  Patient presents with  . Fall   (Consider location/radiation/quality/duration/timing/severity/associated sxs/prior Treatment) Patient is a 79 y.o. female presenting with fall.  Fall   Level 5 caveat due to dementia Pt brought to the ED from local nursing home after a witnessed fall from wheelchair. Pt reportedly hit her head and landed on her R shoulder. This is her third visit for fall in the last week, first time sustained fx of R humerus and laceration on R temple.  Past Medical History  Diagnosis Date  . Unspecified hypothyroidism   . Disorder of bone and cartilage, unspecified   . Chronic airway obstruction, not elsewhere classified   . Unspecified essential hypertension   . Esophageal reflux   . Other and unspecified hyperlipidemia    Past Surgical History  Procedure Laterality Date  . Cataract extractions      bilateral  . Abdominal hysterectomy      total  . Bilateral salpingoopherectomy    . Rectal polyp removal      No malignancy identified   Family History  Problem Relation Age of Onset  . Heart failure Mother   . Cancer Father    History  Substance Use Topics  . Smoking status: Never Smoker   . Smokeless tobacco: Never Used  . Alcohol Use: No   OB History   Grav Para Term Preterm Abortions TAB SAB Ect Mult Living                 Review of Systems Unable to assess due to mental status.   Allergies  Review of patient's allergies indicates no known allergies.  Home Medications   Current Outpatient Rx  Name  Route  Sig  Dispense  Refill  . acetaminophen (TYLENOL) 325 MG tablet   Oral   Take 325 mg by mouth 2 (two) times daily.         . ciprofloxacin (CIPRO) 500 MG tablet   Oral   Take 500 mg by mouth 2 (two) times daily. Pt started on 07-19-12 for 5 days         . clotrimazole (LOTRIMIN) 1 %  cream   Topical   Apply 1 application topically 2 (two) times daily as needed (for raised red areas).          . Cranberry 500 MG CAPS   Oral   Take 500 mg by mouth every morning.          . diphenhydrAMINE (BENADRYL) 25 mg capsule   Oral   Take 25 mg by mouth at bedtime as needed for allergies or sleep.          . enalapril (VASOTEC) 2.5 MG tablet   Oral   Take 1 tablet (2.5 mg total) by mouth every morning.   90 tablet   0     PATIENT IS DUE FOR A ROUTINE PHYSICAL   . ferrous sulfate 325 (65 FE) MG tablet   Oral   Take 325 mg by mouth daily with breakfast.         . levothyroxine (SYNTHROID, LEVOTHROID) 25 MCG tablet   Oral   Take 25 mcg by mouth daily before breakfast.         . loratadine (CLARITIN) 10 MG tablet   Oral   Take 10 mg by mouth daily as needed for  allergies.         . mirtazapine (REMERON) 7.5 MG tablet   Oral   Take 7.5 mg by mouth at bedtime.         . Multiple Vitamin (MULTIVITAMIN WITH MINERALS) TABS   Oral   Take 1 tablet by mouth every morning.         Marland Kitchen omeprazole (PRILOSEC) 20 MG capsule   Oral   Take 40 mg by mouth every morning.          Marland Kitchen oxyCODONE-acetaminophen (PERCOCET/ROXICET) 5-325 MG per tablet   Oral   Take 1 tablet by mouth every 4 (four) hours as needed for pain (Take 1 pill by mouth every 4-6 hours as needed for pain.).   30 tablet   0   . ranitidine (ZANTAC) 150 MG tablet   Oral   Take 150 mg by mouth at bedtime. One hour after supper         . sulfamethoxazole-trimethoprim (BACTRIM,SEPTRA) 400-80 MG per tablet   Oral   Take 1 tablet by mouth every morning.   30 tablet   5    BP 126/47  Pulse 75  Temp(Src) 97.7 F (36.5 C) (Oral)  Resp 18  SpO2 97% Physical Exam  Nursing note and vitals reviewed. Constitutional: She appears well-developed and well-nourished.  HENT:  Head: Normocephalic and atraumatic.  Previously glued laceration on R temple has dehisced. But no signs of infection   Eyes: EOM are normal. Pupils are equal, round, and reactive to light.  Neck: Normal range of motion. Neck supple.  Cardiovascular: Normal rate, normal heart sounds and intact distal pulses.   Pulmonary/Chest: Effort normal and breath sounds normal.  Abdominal: Bowel sounds are normal. She exhibits no distension. There is no tenderness.  Musculoskeletal: Normal range of motion. She exhibits tenderness (R prox humerus; distally neurovascularly intact). She exhibits no edema.  Neurological: She is alert. She has normal strength. No cranial nerve deficit or sensory deficit.  Skin: Skin is warm and dry. No rash noted.  Psychiatric: She has a normal mood and affect.    ED Course  Procedures (including critical care time) Labs Reviewed - No data to display Dg Shoulder Right  07/21/2012   *RADIOLOGY REPORT*  Clinical Data: Fall today.  No new proximal right humerus fracture.  RIGHT SHOULDER - 2+ VIEW  Comparison: 07/13/2012  Findings: The proximal humeral fracture is similar to the prior exam although the shaft appears slightly more displaced in a antrum medial direction in relation to the humeral head than it was previously, now measuring approximate 1 cm in antrum medial displacement, with 15 mm of foreshortening.  There is no evidence of a new fracture.  The humeral head remains normally aligned with the glenoid.  The [facility] joint is normally aligned.  The bones diffusely demineralized.  The underlying ribs are intact.  IMPRESSION: Slightly greater displacement of the major fracture fragments of the proximal humeral fracture, but no evidence of new fracture and no dislocation.   Original Report Authenticated By: Amie Portland, M.D.   Ct Head Wo Contrast  07/21/2012   *RADIOLOGY REPORT*  Clinical Data: Dementia, fell out of wheelchair today, confusion.  CT HEAD WITHOUT CONTRAST  Technique:  Contiguous axial images were obtained from the base of the skull through the vertex without contrast.  Comparison:  07/18/2012.  Findings: There is no evidence for acute infarction, intracranial hemorrhage, mass lesion, hydrocephalus, or extra-axial fluid. Moderate to severe atrophy is present.  Advanced chronic microvascular  ischemic change is present in the periventricular and subcortical white matter.  Bilateral cataract surgery.  Bilateral vascular calcification.  Calvarium intact.  Moderate hyperostosis is age related.  Right frontal scalp hematoma is new from priors.  Tiny frontal midline radiopaque density either calcification or small foreign body.  No acute sinus or mastoid fluid.  IMPRESSION: Right frontal scalp hematoma without underlying skull fracture or intracranial hemorrhage.  Advanced age related changes as described.   Original Report Authenticated By: Davonna Belling, M.D.   1. Fall, initial encounter     MDM  Repeat imaging unremarkable. Daughter at bedside states she is aware of slight change in shoulder from ortho appt yesterday.She is concerned about the level of care the patient is receiving at [facility] and wishes to speak to Child psychotherapist.  Britini Garcilazo B. Bernette Mayers, MD 07/21/12 1422

## 2012-07-21 NOTE — Progress Notes (Signed)
CSW received consult from EDP regardnig patient recurrent falls at ALF. CSW met with pt and pt daughter at bedside Patient has history of dementia. Patient currently a resident at [facility]. CSW offered community resources including private duty nursing, alf list, and obudsman. Patient daughter concerned regarding number of falls. CSW encouraged daughter to discuss with executive director and obudsman regarding concerns and to possible hire a one on one sitter to help prevent falls. Patient daughter shared patient will stand up forgeting that she is weak and will fall. Patient fell out of wheelchair leaning too far forward. Pt plans to return to alf when medically stable.   Catha Gosselin, LCSWA  [phone] .07/21/2012 1503pm

## 2012-07-22 ENCOUNTER — Encounter: Payer: Self-pay | Admitting: Internal Medicine

## 2012-07-22 ENCOUNTER — Ambulatory Visit (INDEPENDENT_AMBULATORY_CARE_PROVIDER_SITE_OTHER): Payer: Medicare Other | Admitting: Internal Medicine

## 2012-07-22 VITALS — BP 120/56 | HR 90 | Temp 97.1°F

## 2012-07-22 DIAGNOSIS — S42309D Unspecified fracture of shaft of humerus, unspecified arm, subsequent encounter for fracture with routine healing: Secondary | ICD-10-CM

## 2012-07-22 DIAGNOSIS — I10 Essential (primary) hypertension: Secondary | ICD-10-CM

## 2012-07-22 DIAGNOSIS — S42201D Unspecified fracture of upper end of right humerus, subsequent encounter for fracture with routine healing: Secondary | ICD-10-CM

## 2012-07-22 DIAGNOSIS — J449 Chronic obstructive pulmonary disease, unspecified: Secondary | ICD-10-CM

## 2012-07-26 NOTE — Assessment & Plan Note (Signed)
BP Readings from Last 3 Encounters:  07/22/12 120/56  07/21/12 124/79  07/19/12 133/61   Good control

## 2012-07-26 NOTE — Assessment & Plan Note (Signed)
No respiratory distress at this time.

## 2012-07-26 NOTE — Progress Notes (Signed)
Subjective:    Patient ID: [name], female    DOB: 11-15-1919, 79 y.o.   MRN: [id number]  HPI [name] presents for ED follow up. Her dementia is progressive. At assisted living she has had multiple falls and sustained a impacted fracture of the right humeral head and a head laceration above the right eye. Her daughter is with her and is the primary historian. [name] can become agitated and needs reorientation and close supervision. Her level of care is AL-memory unit and the facility has recommended private duty sitters for additional safety. The costs are becoming close to unsustainable for the family. It is a very difficult situation.  She has had orthopedic follow up for her humeral fracture and does not require surgery. However, it is very hard to keep her arm positioned in a sling.  Past Medical History  Diagnosis Date  . Unspecified hypothyroidism   . Disorder of bone and cartilage, unspecified   . Chronic airway obstruction, not elsewhere classified   . Unspecified essential hypertension   . Esophageal reflux   . Other and unspecified hyperlipidemia    Past Surgical History  Procedure Laterality Date  . Cataract extractions      bilateral  . Abdominal hysterectomy      total  . Bilateral salpingoopherectomy    . Rectal polyp removal      No malignancy identified   Family History  Problem Relation Age of Onset  . Heart failure Mother   . Cancer Father    History   Social History  . Marital Status: Widowed    Spouse Name: N/A    Number of Children: 2  . Years of Education: N/A   Occupational History  . Homemaker    Social History Main Topics  . Smoking status: Never Smoker   . Smokeless tobacco: Never Used  . Alcohol Use: No  . Drug Use: No  . Sexually Active: No   Other Topics Concern  . 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 Resides in [facility] since 11/12.  Ambulates with  walker. End of Life- pt, in the presence of her daughter, states she doesn't want CPR, mechanical ventilation or heroic/ futile measures. Daughter reports some hallucinations and wandering prior to her admission to an ALF.    Current Outpatient Prescriptions on File Prior to Visit  Medication Sig Dispense Refill  . acetaminophen (TYLENOL) 325 MG tablet Take 325 mg by mouth 2 (two) times daily.      . ciprofloxacin (CIPRO) 500 MG tablet Take 500 mg by mouth 2 (two) times daily. Pt started on 07-19-12 for 5 days      . clotrimazole (LOTRIMIN) 1 % cream Apply 1 application topically 2 (two) times daily as needed (for raised red areas).       . Cranberry 500 MG CAPS Take 500 mg by mouth every morning.       . diphenhydrAMINE (BENADRYL) 25 mg capsule Take 25 mg by mouth at bedtime as needed for allergies or sleep.       . enalapril (VASOTEC) 2.5 MG tablet Take 1 tablet (2.5 mg total) by mouth every morning.  90 tablet  0  . ferrous sulfate 325 (65 FE) MG tablet Take 325 mg by mouth daily with breakfast.      . levothyroxine (SYNTHROID, LEVOTHROID) 25 MCG tablet Take 25 mcg by mouth daily before breakfast.      .  loratadine (CLARITIN) 10 MG tablet Take 10 mg by mouth daily as needed for allergies.      . mirtazapine (REMERON) 7.5 MG tablet Take 7.5 mg by mouth at bedtime.      . Multiple Vitamin (MULTIVITAMIN WITH MINERALS) TABS Take 1 tablet by mouth every morning.      Marland Kitchen omeprazole (PRILOSEC) 20 MG capsule Take 40 mg by mouth every morning.       Marland Kitchen oxyCODONE-acetaminophen (PERCOCET/ROXICET) 5-325 MG per tablet Take 1 tablet by mouth every 4 (four) hours as needed for pain (Take 1 pill by mouth every 4-6 hours as needed for pain.).  30 tablet  0  . ranitidine (ZANTAC) 150 MG tablet Take 150 mg by mouth at bedtime. One hour after supper      . sulfamethoxazole-trimethoprim (BACTRIM,SEPTRA) 400-80 MG per tablet Take 1 tablet by mouth every morning.  30 tablet  5   No current facility-administered  medications on file prior to visit.      Review of Systems System review is negative for any constitutional, cardiac, pulmonary, GI or neuro symptoms or complaints other than as described in the HPI.     Objective:   Physical Exam Filed Vitals:   07/22/12 1202  BP: 120/56  Pulse: 90  Temp: 97.1 F (36.2 C)   Wt Readings from Last 3 Encounters:  06/02/12 109 lb (49.442 kg)  12/31/11 109 lb 9.1 oz (49.7 kg)  10/15/11 111 lb (50.349 kg)   Gen'l- frail, thin, elderly white woman in a w/c slouched to one side, with a right arm sling that is out of position. She is intermittently aware of surroundings HEENT- 2 cm superficial laceration above the right brow. Mild bruising present Cor 2+ radial pulse, RRR Pulm - normal respirations Neuro - awake but not very alert, HOH.       Assessment & Plan:  1. Humeral fracture - apparently stable but at risk for poor healing since positioning is difficult Plan Arm immobilization with sling.   2. Disposition - discussed with daughter - difficult situation. She has some property which may need to be sold. She will require higher level of care: memory unit or SNF, for which she should be eligible with her current injury. Her daughter is looking at facilities and has identified one which does provide attending services from MD and extender staff. This is most likely the best situation. The daughter is reassured that if hospital care is required that if admitted to a [facility] facility I will be able to attend.

## 2012-07-30 ENCOUNTER — Emergency Department (HOSPITAL_COMMUNITY)
Admission: EM | Admit: 2012-07-30 | Discharge: 2012-07-30 | Disposition: A | Discharge status: Skilled Nursing Facility | Payer: Medicare Other | Attending: Emergency Medicine | Admitting: Emergency Medicine

## 2012-07-30 ENCOUNTER — Emergency Department (HOSPITAL_COMMUNITY): Payer: Medicare Other

## 2012-07-30 ENCOUNTER — Encounter (HOSPITAL_COMMUNITY): Payer: Self-pay | Admitting: Emergency Medicine

## 2012-07-30 DIAGNOSIS — Z8739 Personal history of other diseases of the musculoskeletal system and connective tissue: Secondary | ICD-10-CM | POA: Insufficient documentation

## 2012-07-30 DIAGNOSIS — K219 Gastro-esophageal reflux disease without esophagitis: Secondary | ICD-10-CM | POA: Insufficient documentation

## 2012-07-30 DIAGNOSIS — R8271 Bacteriuria: Secondary | ICD-10-CM

## 2012-07-30 DIAGNOSIS — E785 Hyperlipidemia, unspecified: Secondary | ICD-10-CM | POA: Insufficient documentation

## 2012-07-30 DIAGNOSIS — J4489 Other specified chronic obstructive pulmonary disease: Secondary | ICD-10-CM | POA: Insufficient documentation

## 2012-07-30 DIAGNOSIS — E039 Hypothyroidism, unspecified: Secondary | ICD-10-CM | POA: Insufficient documentation

## 2012-07-30 DIAGNOSIS — J449 Chronic obstructive pulmonary disease, unspecified: Secondary | ICD-10-CM | POA: Insufficient documentation

## 2012-07-30 DIAGNOSIS — Z79899 Other long term (current) drug therapy: Secondary | ICD-10-CM | POA: Insufficient documentation

## 2012-07-30 DIAGNOSIS — R51 Headache: Secondary | ICD-10-CM | POA: Insufficient documentation

## 2012-07-30 DIAGNOSIS — F068 Other specified mental disorders due to known physiological condition: Secondary | ICD-10-CM | POA: Insufficient documentation

## 2012-07-30 DIAGNOSIS — I10 Essential (primary) hypertension: Secondary | ICD-10-CM | POA: Insufficient documentation

## 2012-07-30 DIAGNOSIS — R82998 Other abnormal findings in urine: Secondary | ICD-10-CM | POA: Insufficient documentation

## 2012-07-30 LAB — BASIC METABOLIC PANEL
CO2: 26 mEq/L (ref 19–32)
Calcium: 8.9 mg/dL (ref 8.4–10.5)
Glucose, Bld: 110 mg/dL — ABNORMAL HIGH (ref 70–99)
Potassium: 3.8 mEq/L (ref 3.5–5.1)
Sodium: 138 mEq/L (ref 135–145)

## 2012-07-30 LAB — URINE MICROSCOPIC-ADD ON

## 2012-07-30 LAB — CBC WITH DIFFERENTIAL/PLATELET
Eosinophils Relative: 1 % (ref 0–5)
HCT: 29.1 % — ABNORMAL LOW (ref 36.0–46.0)
Lymphocytes Relative: 12 % (ref 12–46)
Lymphs Abs: 1.1 10*3/uL (ref 0.7–4.0)
MCV: 90.4 fL (ref 78.0–100.0)
Platelets: 362 10*3/uL (ref 150–400)
RBC: 3.22 MIL/uL — ABNORMAL LOW (ref 3.87–5.11)
WBC: 9.2 10*3/uL (ref 4.0–10.5)

## 2012-07-30 LAB — URINALYSIS, ROUTINE W REFLEX MICROSCOPIC
Glucose, UA: NEGATIVE mg/dL
Specific Gravity, Urine: 1.016 (ref 1.005–1.030)
pH: 6 (ref 5.0–8.0)

## 2012-07-30 NOTE — ED Notes (Signed)
Pt discharged with Ptar and family

## 2012-07-30 NOTE — ED Provider Notes (Signed)
History    CSN: [id number] Arrival date & time 07/30/12  1512  First MD Initiated Contact with Patient 07/30/12 1517     Chief Complaint  Patient presents with  . Headache   (Consider location/radiation/quality/duration/timing/severity/associated sxs/prior Treatment) HPI Comments: Level V caveat -- dementia. Pt currently c/o HA, back pain, shoulder pain. H/o HA usually resolved with caffeine per EMS report. Seen in ED x 3 over past 2 weeks after falling. CT x 3 neg. No blood thinners.   Patient is a 79 y.o. female presenting with headaches. The history is provided by medical records and the nursing home.  Headache  Past Medical History  Diagnosis Date  . Unspecified hypothyroidism   . Disorder of bone and cartilage, unspecified   . Chronic airway obstruction, not elsewhere classified   . Unspecified essential hypertension   . Esophageal reflux   . Other and unspecified hyperlipidemia    Past Surgical History  Procedure Laterality Date  . Cataract extractions      bilateral  . Abdominal hysterectomy      total  . Bilateral salpingoopherectomy    . Rectal polyp removal      No malignancy identified   Family History  Problem Relation Age of Onset  . Heart failure Mother   . Cancer Father    History  Substance Use Topics  . Smoking status: Never Smoker   . Smokeless tobacco: Never Used  . Alcohol Use: No   OB History   Grav Para Term Preterm Abortions TAB SAB Ect Mult Living                 Review of Systems  Unable to perform ROS: Dementia  Neurological: Positive for headaches.    Allergies  Review of patient's allergies indicates no known allergies.  Home Medications   Current Outpatient Rx  Name  Route  Sig  Dispense  Refill  . acetaminophen (TYLENOL) 325 MG tablet   Oral   Take 325 mg by mouth 2 (two) times daily.         . ciprofloxacin (CIPRO) 500 MG tablet   Oral   Take 500 mg by mouth 2 (two) times daily. Pt started on 07-19-12 for 5 days          . clotrimazole (LOTRIMIN) 1 % cream   Topical   Apply 1 application topically 2 (two) times daily as needed (for raised red areas).          . Cranberry 500 MG CAPS   Oral   Take 500 mg by mouth every morning.          . diphenhydrAMINE (BENADRYL) 25 mg capsule   Oral   Take 25 mg by mouth at bedtime as needed for allergies or sleep.          . enalapril (VASOTEC) 2.5 MG tablet   Oral   Take 1 tablet (2.5 mg total) by mouth every morning.   90 tablet   0     PATIENT IS DUE FOR A ROUTINE PHYSICAL   . ferrous sulfate 325 (65 FE) MG tablet   Oral   Take 325 mg by mouth daily with breakfast.         . levothyroxine (SYNTHROID, LEVOTHROID) 25 MCG tablet   Oral   Take 25 mcg by mouth daily before breakfast.         . loratadine (CLARITIN) 10 MG tablet   Oral   Take 10 mg by mouth daily  as needed for allergies.         . mirtazapine (REMERON) 7.5 MG tablet   Oral   Take 7.5 mg by mouth at bedtime.         . Multiple Vitamin (MULTIVITAMIN WITH MINERALS) TABS   Oral   Take 1 tablet by mouth every morning.         Marland Kitchen omeprazole (PRILOSEC) 20 MG capsule   Oral   Take 40 mg by mouth every morning.          Marland Kitchen oxyCODONE-acetaminophen (PERCOCET/ROXICET) 5-325 MG per tablet   Oral   Take 1 tablet by mouth every 4 (four) hours as needed for pain (Take 1 pill by mouth every 4-6 hours as needed for pain.).   30 tablet   0   . ranitidine (ZANTAC) 150 MG tablet   Oral   Take 150 mg by mouth at bedtime. One hour after supper         . sulfamethoxazole-trimethoprim (BACTRIM,SEPTRA) 400-80 MG per tablet   Oral   Take 1 tablet by mouth every morning.   30 tablet   5    BP 166/70  Pulse 90  Temp(Src) 98.1 F (36.7 C) (Oral)  Resp 14  SpO2 97% Physical Exam  Nursing note and vitals reviewed. Constitutional: She appears well-developed and well-nourished.  HENT:  Head: Normocephalic and atraumatic.  Eyes: Conjunctivae are normal. Pupils are  equal, round, and reactive to light. Right eye exhibits no discharge. Left eye exhibits no discharge.  Eyes crusted  Neck: Normal range of motion. Neck supple.  Cardiovascular: Normal rate, regular rhythm and normal heart sounds.   Pulmonary/Chest: Effort normal and breath sounds normal.  Abdominal: Soft. There is no tenderness.  Musculoskeletal:  R shoulder sling in place  Neurological: She is alert.  Gross motor intact. Pt responds to voice.   Skin: Skin is warm and dry.  Psychiatric: She has a normal mood and affect.    ED Course  Procedures (including critical care time) Labs Reviewed  CBC WITH DIFFERENTIAL - Abnormal; Notable for the following:    RBC 3.22 (*)    Hemoglobin 9.2 (*)    HCT 29.1 (*)    Neutrophils Relative % 79 (*)    All other components within normal limits  BASIC METABOLIC PANEL - Abnormal; Notable for the following:    Glucose, Bld 110 (*)    GFR calc non Af Amer 51 (*)    GFR calc Af Amer 59 (*)    All other components within normal limits  URINALYSIS, ROUTINE W REFLEX MICROSCOPIC - Abnormal; Notable for the following:    Color, Urine AMBER (*)    APPearance TURBID (*)    Hgb urine dipstick SMALL (*)    Protein, ur 100 (*)    Leukocytes, UA LARGE (*)    All other components within normal limits  URINE MICROSCOPIC-ADD ON - Abnormal; Notable for the following:    Bacteria, UA MANY (*)    All other components within normal limits  URINE CULTURE  POCT I-STAT TROPONIN I   Ct Head Wo Contrast  07/30/2012   *RADIOLOGY REPORT*  Clinical Data: Headache.  Altered mental status.  CT HEAD WITHOUT CONTRAST  Technique:  Contiguous axial images were obtained from the base of the skull through the vertex without contrast.  Comparison: None.  Findings: There is no acute intracranial hemorrhage, infarction, or mass lesion.  There is moderate cerebral cortical and cerebellar atrophy with prominent periventricular white  matter disease, more prominent on the right than  the left as well as old lacunar infarcts in the basal ganglia.  Osseous structures are normal.  IMPRESSION: Diffuse atrophy with extensive chronic small vessel ischemic disease as well as small old lacunar infarcts.  No acute abnormalities.   Original Report Authenticated By: Francene Boyers, M.D.   1. Headache   2. Bacteriuria with pyuria     3:31 PM Patient seen and examined. Work-up initiated. D/w Dr. Rubin Payor.   Vital signs reviewed and are as follows: Filed Vitals:   07/30/12 1522  BP: 166/70  Pulse: 90  Temp: 98.1 F (36.7 C)  Resp: 78   Spoke with daughter who is now at bedside. States patient is less responsive today and c/o HA which she does not typically complain of.     Date: 07/30/2012  Rate: 77  Rhythm: normal sinus rhythm  QRS Axis: normal  Intervals: normal  ST/T Wave abnormalities: normal  Conduction Disutrbances:none  Narrative Interpretation:   Old EKG Reviewed: unchanged from 07/18/2012  Patient results d/w Dr. Rubin Payor who has seen patient.   Previous 2 urine cultures had no growth. Third grew E. Coli susceptible to Cipro.   Given these results -- will have PCP f/u on urine culture.   Family informed of results and agree.   Patient urged to return with worsening symptoms or other concerns. Patient verbalized understanding and agrees with plan.     MDM  HA: CT neg.   Decreased responsiveness: work-up shows possible UTI -- however she was recently treated and previous cultures did not grow. PCP to f/u. Work-up otherwise unrevealing.    Renne Crigler, PA-C 07/30/12 2029

## 2012-07-30 NOTE — ED Notes (Signed)
C/O very bad headache, is from alzheimer's unit. Daughter states she is not following directions as well as she normally does.

## 2012-07-31 NOTE — ED Provider Notes (Signed)
Medical screening examination/treatment/procedure(s) were performed by non-physician practitioner and as supervising physician I was immediately available for consultation/collaboration.  Deunta Beneke R. Felita Bump, MD 07/31/12 1652 

## 2012-08-02 LAB — URINE CULTURE

## 2012-08-06 ENCOUNTER — Emergency Department (HOSPITAL_COMMUNITY): Payer: Medicare Other

## 2012-08-06 ENCOUNTER — Emergency Department (HOSPITAL_COMMUNITY)
Admission: EM | Admit: 2012-08-06 | Discharge: 2012-08-06 | Disposition: A | Discharge status: Skilled Nursing Facility | Payer: Medicare Other | Attending: Emergency Medicine | Admitting: Emergency Medicine

## 2012-08-06 ENCOUNTER — Other Ambulatory Visit: Payer: Self-pay

## 2012-08-06 ENCOUNTER — Encounter (HOSPITAL_COMMUNITY): Payer: Self-pay | Admitting: Emergency Medicine

## 2012-08-06 DIAGNOSIS — Z8639 Personal history of other endocrine, nutritional and metabolic disease: Secondary | ICD-10-CM | POA: Insufficient documentation

## 2012-08-06 DIAGNOSIS — J4489 Other specified chronic obstructive pulmonary disease: Secondary | ICD-10-CM | POA: Insufficient documentation

## 2012-08-06 DIAGNOSIS — I10 Essential (primary) hypertension: Secondary | ICD-10-CM | POA: Insufficient documentation

## 2012-08-06 DIAGNOSIS — Z862 Personal history of diseases of the blood and blood-forming organs and certain disorders involving the immune mechanism: Secondary | ICD-10-CM | POA: Insufficient documentation

## 2012-08-06 DIAGNOSIS — E039 Hypothyroidism, unspecified: Secondary | ICD-10-CM | POA: Insufficient documentation

## 2012-08-06 DIAGNOSIS — S42209A Unspecified fracture of upper end of unspecified humerus, initial encounter for closed fracture: Secondary | ICD-10-CM | POA: Insufficient documentation

## 2012-08-06 DIAGNOSIS — Y9210 Unspecified residential institution as the place of occurrence of the external cause: Secondary | ICD-10-CM | POA: Insufficient documentation

## 2012-08-06 DIAGNOSIS — F0390 Unspecified dementia without behavioral disturbance: Secondary | ICD-10-CM | POA: Insufficient documentation

## 2012-08-06 DIAGNOSIS — J449 Chronic obstructive pulmonary disease, unspecified: Secondary | ICD-10-CM | POA: Insufficient documentation

## 2012-08-06 DIAGNOSIS — Z8739 Personal history of other diseases of the musculoskeletal system and connective tissue: Secondary | ICD-10-CM | POA: Insufficient documentation

## 2012-08-06 DIAGNOSIS — W19XXXA Unspecified fall, initial encounter: Secondary | ICD-10-CM | POA: Insufficient documentation

## 2012-08-06 DIAGNOSIS — K5900 Constipation, unspecified: Secondary | ICD-10-CM | POA: Insufficient documentation

## 2012-08-06 DIAGNOSIS — Y939 Activity, unspecified: Secondary | ICD-10-CM | POA: Insufficient documentation

## 2012-08-06 DIAGNOSIS — K219 Gastro-esophageal reflux disease without esophagitis: Secondary | ICD-10-CM | POA: Insufficient documentation

## 2012-08-06 DIAGNOSIS — R29818 Other symptoms and signs involving the nervous system: Secondary | ICD-10-CM | POA: Insufficient documentation

## 2012-08-06 DIAGNOSIS — S42301S Unspecified fracture of shaft of humerus, right arm, sequela: Secondary | ICD-10-CM

## 2012-08-06 DIAGNOSIS — Z79899 Other long term (current) drug therapy: Secondary | ICD-10-CM | POA: Insufficient documentation

## 2012-08-06 MED ORDER — MAGNESIUM CITRATE PO SOLN: 1.0000 | Freq: Once | ORAL | Status: DC

## 2012-08-06 MED ORDER — DOCUSATE SODIUM 100 MG PO CAPS: 100.0000 mg | ORAL_CAPSULE | Freq: Two times a day (BID) | ORAL | Status: DC

## 2012-08-06 NOTE — ED Notes (Signed)
Patient transported to X-ray 

## 2012-08-06 NOTE — ED Notes (Signed)
Brought in by EMS from [facility] (Alzheimer's Unit) for further evaluation after her fall this morning.  Per EMS, pt was found lying on floor by staff at the facility---- pt has had recent fall and sustained "fracture" in her right shoulder; pt presents no s/s changes in condition and mental status after her fall.

## 2012-08-06 NOTE — ED Notes (Signed)
[facility] called for transportation  

## 2012-08-06 NOTE — ED Notes (Signed)
[id number]<AV> Expected date:<BR> Expected time:<BR> Means of arrival:<BR> Comments:<BR> EMS/79 yo from SNF with fall

## 2012-08-06 NOTE — ED Provider Notes (Addendum)
History    CSN: [id number] Arrival date & time 08/06/12  4098  First MD Initiated Contact with Patient 08/06/12 0710     Chief Complaint  Patient presents with  . Fall   (Consider location/radiation/quality/duration/timing/severity/associated sxs/prior Treatment) HPI Comments: Patient brought to the ER for evaluation after patient was found lying on the floor her limb by nursing home staff. Patient had a recent fat with right shoulder injury. Upon evaluation by the staff they could not find any obvious new injury. Upon my evaluation with the patient, she does not answer questions appropriately because of dementia. Level V caveat applies.  Patient is a 79 y.o. female presenting with fall.  Fall   Past Medical History  Diagnosis Date  . Unspecified hypothyroidism   . Disorder of bone and cartilage, unspecified   . Chronic airway obstruction, not elsewhere classified   . Unspecified essential hypertension   . Esophageal reflux   . Other and unspecified hyperlipidemia    Past Surgical History  Procedure Laterality Date  . Cataract extractions      bilateral  . Abdominal hysterectomy      total  . Bilateral salpingoopherectomy    . Rectal polyp removal      No malignancy identified   Family History  Problem Relation Age of Onset  . Heart failure Mother   . Cancer Father    History  Substance Use Topics  . Smoking status: Never Smoker   . Smokeless tobacco: Never Used  . Alcohol Use: No   OB History   Grav Para Term Preterm Abortions TAB SAB Ect Mult Living                 Review of Systems  Unable to perform ROS: Dementia    Allergies  Review of patient's allergies indicates no known allergies.  Home Medications   Current Outpatient Rx  Name  Route  Sig  Dispense  Refill  . acetaminophen (TYLENOL) 325 MG tablet   Oral   Take 325 mg by mouth 2 (two) times daily.         . ciprofloxacin (CIPRO) 500 MG tablet   Oral   Take 500 mg by mouth 2 (two)  times daily. Pt started on 07-19-12 for 5 days         . Cranberry 500 MG CAPS   Oral   Take 500 mg by mouth every morning.          . enalapril (VASOTEC) 2.5 MG tablet   Oral   Take 1 tablet (2.5 mg total) by mouth every morning.   90 tablet   0     PATIENT IS DUE FOR A ROUTINE PHYSICAL   . ferrous sulfate 325 (65 FE) MG tablet   Oral   Take 325 mg by mouth daily with breakfast.         . levothyroxine (SYNTHROID, LEVOTHROID) 25 MCG tablet   Oral   Take 25 mcg by mouth daily before breakfast.         . mirtazapine (REMERON) 7.5 MG tablet   Oral   Take 7.5 mg by mouth at bedtime.         . Multiple Vitamin (MULTIVITAMIN WITH MINERALS) TABS   Oral   Take 1 tablet by mouth every morning. Therapy M         . omeprazole (PRILOSEC) 20 MG capsule   Oral   Take 40 mg by mouth every morning.          Marland Kitchen  ranitidine (ZANTAC) 150 MG tablet   Oral   Take 150 mg by mouth at bedtime. One hour after supper         . sulfamethoxazole-trimethoprim (BACTRIM,SEPTRA) 400-80 MG per tablet   Oral   Take 1 tablet by mouth every morning.   30 tablet   5    BP 160/75  Pulse 87  Temp(Src) 98.4 F (36.9 C) (Oral)  Resp 22  SpO2 99% Physical Exam  Constitutional: She is oriented to person, place, and time. She appears well-developed and well-nourished. No distress.  HENT:  Head: Normocephalic and atraumatic.  Right Ear: Hearing normal.  Left Ear: Hearing normal.  Nose: Nose normal.  Mouth/Throat: Oropharynx is clear and moist and mucous membranes are normal.  Eyes: Conjunctivae and EOM are normal. Pupils are equal, round, and reactive to light.  Neck: Normal range of motion. Neck supple.  Cardiovascular: Regular rhythm, S1 normal and S2 normal.  Exam reveals no gallop and no friction rub.   No murmur heard. Pulmonary/Chest: Effort normal and breath sounds normal. No respiratory distress. She exhibits no tenderness.  Abdominal: Soft. Normal appearance and bowel sounds  are normal. There is no hepatosplenomegaly. There is no tenderness. There is no rebound, no guarding, no tenderness at McBurney's point and negative Murphy's sign. No hernia.  Musculoskeletal:       Right shoulder: She exhibits decreased range of motion and tenderness.       Cervical back: Normal.       Thoracic back: Normal.       Lumbar back: Normal.  Neurological: She is alert and oriented to person, place, and time. She has normal strength. No cranial nerve deficit or sensory deficit. Coordination normal. GCS eye subscore is 4. GCS verbal subscore is 5. GCS motor subscore is 6.  Skin: Skin is warm, dry and intact. No rash noted. No cyanosis.  Psychiatric: She has a normal mood and affect. Her speech is normal and behavior is normal. Thought content normal.    ED Course  Procedures (including critical care time) Labs Reviewed - No data to display No results found.  Diagnosis: 1. Right proximal humerus fracture, subacute 2. Constipation  MDM  Patient Doctor neighbor to department after a fall. Patient has a known right proximal humerus fracture. X-ray of chest, shoulder and pelvis did not show any new injury. CT head performed because of patient's altered mental status and fall, no acute findings. Patient does have evidence of fecal impaction on pelvic exam. Will treat with Colace at nursing home.  Reviewing the records reveals that the patient has ongoing care with orthopedics. No notes in Epic system, indicating who the patient's orthopedic physician is. Will need to followup in the office.  Gilda Crease, MD 08/06/12 1610  Gilda Crease, MD 08/06/12 [phone]

## 2012-08-11 ENCOUNTER — Telehealth: Payer: Self-pay | Admitting: Internal Medicine

## 2012-08-11 NOTE — Telephone Encounter (Signed)
Called to schedule appointment.  The nursing home states her eye is clear now and they believe it was a blood vessel issue.  She stated the daughter will call back if the problem persists or get worse.

## 2012-09-15 ENCOUNTER — Other Ambulatory Visit: Payer: Self-pay

## 2012-09-15 MED ORDER — ENALAPRIL MALEATE 2.5 MG PO TABS: 2.5000 mg | ORAL_TABLET | Freq: Every morning | ORAL | Status: DC

## 2012-12-03 ENCOUNTER — Other Ambulatory Visit: Payer: Self-pay

## 2013-03-03 ENCOUNTER — Emergency Department (HOSPITAL_COMMUNITY)
Admission: EM | Admit: 2013-03-03 | Discharge: 2013-03-03 | Disposition: A | Discharge status: Home / Self Care | Payer: Medicare Other | Attending: Emergency Medicine | Admitting: Emergency Medicine

## 2013-03-03 ENCOUNTER — Encounter (HOSPITAL_COMMUNITY): Payer: Self-pay | Admitting: Emergency Medicine

## 2013-03-03 DIAGNOSIS — F0390 Unspecified dementia without behavioral disturbance: Secondary | ICD-10-CM | POA: Insufficient documentation

## 2013-03-03 DIAGNOSIS — I10 Essential (primary) hypertension: Secondary | ICD-10-CM | POA: Insufficient documentation

## 2013-03-03 DIAGNOSIS — IMO0002 Reserved for concepts with insufficient information to code with codable children: Secondary | ICD-10-CM | POA: Insufficient documentation

## 2013-03-03 DIAGNOSIS — N390 Urinary tract infection, site not specified: Secondary | ICD-10-CM | POA: Insufficient documentation

## 2013-03-03 DIAGNOSIS — J4489 Other specified chronic obstructive pulmonary disease: Secondary | ICD-10-CM | POA: Insufficient documentation

## 2013-03-03 DIAGNOSIS — Z8739 Personal history of other diseases of the musculoskeletal system and connective tissue: Secondary | ICD-10-CM | POA: Insufficient documentation

## 2013-03-03 DIAGNOSIS — Z79899 Other long term (current) drug therapy: Secondary | ICD-10-CM | POA: Insufficient documentation

## 2013-03-03 DIAGNOSIS — E039 Hypothyroidism, unspecified: Secondary | ICD-10-CM | POA: Insufficient documentation

## 2013-03-03 DIAGNOSIS — J449 Chronic obstructive pulmonary disease, unspecified: Secondary | ICD-10-CM | POA: Insufficient documentation

## 2013-03-03 DIAGNOSIS — K219 Gastro-esophageal reflux disease without esophagitis: Secondary | ICD-10-CM | POA: Insufficient documentation

## 2013-03-03 HISTORY — DX: Unspecified dementia, unspecified severity, without behavioral disturbance, psychotic disturbance, mood disturbance, and anxiety: F03.90

## 2013-03-03 LAB — CBC WITH DIFFERENTIAL/PLATELET
Basophils Absolute: 0 K/uL (ref 0.0–0.1)
Basophils Relative: 0 % (ref 0–1)
Eosinophils Absolute: 0.2 K/uL (ref 0.0–0.7)
Eosinophils Relative: 2 % (ref 0–5)
HCT: 27.4 % — ABNORMAL LOW (ref 36.0–46.0)
Hemoglobin: 8.4 g/dL — ABNORMAL LOW (ref 12.0–15.0)
Lymphocytes Relative: 25 % (ref 12–46)
Lymphs Abs: 2.5 K/uL (ref 0.7–4.0)
MCH: 25.5 pg — ABNORMAL LOW (ref 26.0–34.0)
MCHC: 30.7 g/dL (ref 30.0–36.0)
MCV: 83.3 fL (ref 78.0–100.0)
Monocytes Absolute: 0.9 K/uL (ref 0.1–1.0)
Monocytes Relative: 9 % (ref 3–12)
Neutro Abs: 6.5 K/uL (ref 1.7–7.7)
Neutrophils Relative %: 63 % (ref 43–77)
Platelets: 346 K/uL (ref 150–400)
RBC: 3.29 MIL/uL — ABNORMAL LOW (ref 3.87–5.11)
RDW: 14.8 % (ref 11.5–15.5)
WBC: 10.2 K/uL (ref 4.0–10.5)

## 2013-03-03 LAB — URINE MICROSCOPIC-ADD ON

## 2013-03-03 LAB — URINALYSIS, ROUTINE W REFLEX MICROSCOPIC
BILIRUBIN URINE: NEGATIVE
Glucose, UA: NEGATIVE mg/dL
KETONES UR: NEGATIVE mg/dL
NITRITE: NEGATIVE
PROTEIN: 100 mg/dL — AB
Specific Gravity, Urine: 1.015 (ref 1.005–1.030)
Urobilinogen, UA: 0.2 mg/dL (ref 0.0–1.0)
pH: 6 (ref 5.0–8.0)

## 2013-03-03 LAB — COMPREHENSIVE METABOLIC PANEL WITH GFR
ALT: 14 U/L (ref 0–35)
AST: 16 U/L (ref 0–37)
Albumin: 3.3 g/dL — ABNORMAL LOW (ref 3.5–5.2)
Alkaline Phosphatase: 111 U/L (ref 39–117)
BUN: 25 mg/dL — ABNORMAL HIGH (ref 6–23)
CO2: 23 meq/L (ref 19–32)
Calcium: 8.7 mg/dL (ref 8.4–10.5)
Chloride: 102 meq/L (ref 96–112)
Creatinine, Ser: 1.12 mg/dL — ABNORMAL HIGH (ref 0.50–1.10)
GFR calc Af Amer: 48 mL/min — ABNORMAL LOW
GFR calc non Af Amer: 42 mL/min — ABNORMAL LOW
Glucose, Bld: 140 mg/dL — ABNORMAL HIGH (ref 70–99)
Potassium: 4.1 meq/L (ref 3.7–5.3)
Sodium: 138 meq/L (ref 137–147)
Total Bilirubin: <0.2 mg/dL — ABNORMAL LOW (ref 0.3–1.2)
Total Protein: 6.2 g/dL (ref 6.0–8.3)

## 2013-03-03 LAB — POCT I-STAT TROPONIN I: TROPONIN I, POC: 0 ng/mL (ref 0.00–0.08)

## 2013-03-03 MED ORDER — CIPROFLOXACIN IN D5W 400 MG/200ML IV SOLN
400.0000 mg | Freq: Once | INTRAVENOUS | Status: AC
  Administered 2013-03-03: 400 mg via INTRAVENOUS
  Filled 2013-03-03: qty 200

## 2013-03-03 MED ORDER — CIPROFLOXACIN HCL 500 MG PO TABS: 500.0000 mg | ORAL_TABLET | Freq: Two times a day (BID) | ORAL | Status: DC

## 2013-03-03 MED ORDER — SODIUM CHLORIDE 0.9 % IV BOLUS (SEPSIS)
500.0000 mL | Freq: Once | INTRAVENOUS | Status: AC
Start: 2013-03-03 — End: 2013-03-03
  Administered 2013-03-03: 500 mL via INTRAVENOUS

## 2013-03-03 NOTE — ED Provider Notes (Signed)
CSN: [id number]     Arrival date & time 03/03/13  0714 History   First MD Initiated Contact with Patient 03/03/13 0719     Chief Complaint  Patient presents with  . Altered Mental Status   (Consider location/radiation/quality/duration/timing/severity/associated sxs/prior Treatment) The history is provided by the patient and the EMS personnel.  [name] is a 80 y.o. female hx of dementia, HL here with AMS. As per staff at the nursing home she was ambulating to the dining room this morning and then sat down and became unresponsive for about 30 seconds. No head injury noted. Patient does not remember the incident. As per EMS she is back to baseline now. Denies any chest pain or shortness of breath or fever or cough or abdominal pain or vomiting.   Level V caveat- dementia    Past Medical History  Diagnosis Date  . Unspecified hypothyroidism   . Disorder of bone and cartilage, unspecified   . Chronic airway obstruction, not elsewhere classified   . Unspecified essential hypertension   . Esophageal reflux   . Other and unspecified hyperlipidemia   . Dementia    Past Surgical History  Procedure Laterality Date  . Cataract extractions      bilateral  . Abdominal hysterectomy      total  . Bilateral salpingoopherectomy    . Rectal polyp removal      No malignancy identified   Family History  Problem Relation Age of Onset  . Heart failure Mother   . Cancer Father    History  Substance Use Topics  . Smoking status: Never Smoker   . Smokeless tobacco: Never Used  . Alcohol Use: No   OB History   Grav Para Term Preterm Abortions TAB SAB Ect Mult Living                 Review of Systems  Unable to perform ROS: Dementia    Allergies  Review of patient's allergies indicates no known allergies.  Home Medications   Current Outpatient Rx  Name  Route  Sig  Dispense  Refill  . acetaminophen (TYLENOL) 325 MG tablet   Oral   Take 325 mg by mouth 2 (two) times  daily.         . clotrimazole (LOTRIMIN) 1 % cream   Topical   Apply 1 application topically 2 (two) times daily.         . Cranberry 500 MG CAPS   Oral   Take 500 mg by mouth every morning.          . docusate sodium (COLACE) 100 MG capsule   Oral   Take 1 capsule (100 mg total) by mouth every 12 (twelve) hours.   60 capsule   0   . enalapril (VASOTEC) 2.5 MG tablet   Oral   Take 1 tablet (2.5 mg total) by mouth every morning.   30 tablet   5   . Eyelid Cleansers (OCUSOFT LID SCRUB EX)   Apply externally   Apply 1 application topically daily. Use to clean both eyes every day         . ferrous sulfate 325 (65 FE) MG tablet   Oral   Take 325 mg by mouth daily with breakfast.         . levothyroxine (SYNTHROID, LEVOTHROID) 50 MCG tablet   Oral   Take 50 mcg by mouth daily before breakfast.         . omeprazole (PRILOSEC)  20 MG capsule   Oral   Take 40 mg by mouth every morning.          Marland Kitchen OVER THE COUNTER MEDICATION   Oral   Take 1 tablet by mouth daily. Therapy M 9mg -         . triamcinolone cream (KENALOG) 0.1 %   Topical   Apply 1 application topically 2 (two) times daily.          BP 152/60  Pulse 84  Temp(Src) 97.2 F (36.2 C) (Rectal)  Resp 16  SpO2 99% Physical Exam  Nursing note and vitals reviewed. Constitutional:  Demented, chronically ill   HENT:  Head: Normocephalic and atraumatic.  Mouth/Throat: Oropharynx is clear and moist.  Eyes: Conjunctivae are normal. Pupils are equal, round, and reactive to light.  Neck: Normal range of motion. Neck supple.  Cardiovascular: Normal rate, regular rhythm and normal heart sounds.   Pulmonary/Chest: Effort normal and breath sounds normal. No respiratory distress. She has no wheezes. She has no rales.  Abdominal: Soft. Bowel sounds are normal. She exhibits no distension. There is no tenderness. There is no rebound.  Musculoskeletal: Normal range of motion.  Neurological: She is  alert.  Demented, moving all extremities   Skin: Skin is warm and dry.  Psychiatric:  Unable     ED Course  Procedures (including critical care time) Labs Review Labs Reviewed  URINALYSIS, ROUTINE W REFLEX MICROSCOPIC - Abnormal; Notable for the following:    APPearance TURBID (*)    Hgb urine dipstick MODERATE (*)    Protein, ur 100 (*)    Leukocytes, UA LARGE (*)    All other components within normal limits  CBC WITH DIFFERENTIAL - Abnormal; Notable for the following:    RBC 3.29 (*)    Hemoglobin 8.4 (*)    HCT 27.4 (*)    MCH 25.5 (*)    All other components within normal limits  COMPREHENSIVE METABOLIC PANEL - Abnormal; Notable for the following:    Glucose, Bld 140 (*)    BUN 25 (*)    Creatinine, Ser 1.12 (*)    Albumin 3.3 (*)    Total Bilirubin <0.2 (*)    GFR calc non Af Amer 42 (*)    GFR calc Af Amer 48 (*)    All other components within normal limits  URINE CULTURE  URINE MICROSCOPIC-ADD ON  POCT I-STAT TROPONIN I   Imaging Review No results found.  EKG Interpretation    Date/Time:  Wednesday March 03 2013 07:32:09 EST Ventricular Rate:  74 PR Interval:  169 QRS Duration: 87 QT Interval:  408 QTC Calculation: 453 R Axis:   11 Text Interpretation:  Sinus rhythm Abnormal R-wave progression, early transition Consider anterior infarct Previous EKG showed afib, but she was in sinus rhythm previously Confirmed by YAO  MD, DAVID ([id number]) on 03/03/2013 7:34:21 AM            MDM  No diagnosis found. [name] is a 80 y.o. female here with AMS. She had similar episodes previously. Likely worsening dementia. Will get labs, UA, EKG. No head injury so no CT head necessary.   9:53 AM Labs at baseline. UA + leuk. She has multiple UTIs in the past. Previous urine culture sensitive to cipro and not ceftraixone or keflex or bactrim. Will load with cipro and d/c back to nursing home with same.    Richardean Canal, MD 03/03/13 [phone]

## 2013-03-03 NOTE — Discharge Instructions (Signed)
Take cipro 500 mg twice a day for a week.   Follow up with your doctor.   Return to ER if you have fever, passing out, lethargy.

## 2013-03-03 NOTE — ED Notes (Signed)
Bed: ZO10WA11 Expected date:  Expected time:  Means of arrival:  Comments: EMS/elderly with occ periods of unresponsiveness

## 2013-03-03 NOTE — ED Notes (Addendum)
Per ems pt is from [facility], hx of dementia, staff report brief period of unconsciousness about 30 seconds.pt had just ambulated to dinning room with walker with no assistance, sat down, was talking to staff and had "unresponsiveness". Staff report they were to get a urine sample today to check fro UTI. Upon ems arrival pt responsive to verbal and painful stimuli,which is pts baseline. cbg 156. ems reports pupils pinpoint, pt not on narcotics, but has taken morning meds. EKG unremarkable.

## 2013-03-03 NOTE — Progress Notes (Signed)
CSW met with patient and patient daughter at bedside. CSW is familiar with patient from previous admissions. Patient has dementia but is alert and oriented to self and place. Patient daughter shared that patient is at [facility] in memory care and plans to return when medically stable. Patient daughter shared that patient was upset prior to her arrival and was not talking much due to her confusion. CSW provided supportive counseling to patient daughter and patient. Patient happy and joking, "I'm not just gonna lay around here all day am I? I need to find something to do."   Per chart review, patient medically stable to return to [facility] care.    Noreene Larsson [phone]  ED CSW 03/03/2013 955am

## 2013-03-04 LAB — URINE CULTURE: Colony Count: >=100000

## 2013-03-05 NOTE — Progress Notes (Signed)
ED Antimicrobial Stewardship Positive Culture Follow Up   [name] is an 80 y.o. female who [facility] with a chief complaint of  Chief Complaint  Patient presents with  . Altered Mental Status    Recent Results (from the past 720 hour(s))  URINE CULTURE     Status: None   Collection Time    03/03/13  8:33 AM      Result Value Range Status   Specimen Description URINE, CATHETERIZED   Final   Special Requests NONE   Final   Culture  Setup Time     Final   Value: 03/03/2013 11:32     Performed at [facility]   Colony Count     Final   Value: >=100,000 COLONIES/ML     Performed at [facility]   Culture     Final   Value: VIRIDANS STREPTOCOCCUS     Performed at [facility]   Report Status 03/04/2013 FINAL   Final    [x]  Treated with cipro, organism resistant to prescribed antimicrobial Pt came in with AMS. UA looks positive. She was sent home on Cipro for UTI. Culture came back with viridans strep. Cipro is not got for strep so we'll change to keflex.   New antibiotic prescription:   Stop Cipro Start Keflex 500mg  PO BID x7 days  ED Provider: Fara Borosobyn Alberts, PA   Ulyses SouthwardMinh Pham, PharmD Pager: [phone] Infectious Diseases Pharmacist Phone# [phone]

## 2013-03-05 NOTE — ED Notes (Signed)
+   Urine Chart reviewed by Johnnette Gourdobyn Albert  With orders to D/C Cipro-Start keflex 500 mg po BID x 7 days  Needs to be called to pharmacy.

## 2013-05-23 ENCOUNTER — Emergency Department (HOSPITAL_COMMUNITY)
Admission: EM | Admit: 2013-05-23 | Discharge: 2013-05-23 | Disposition: A | Discharge status: Home / Self Care | Payer: Medicare Other | Attending: Emergency Medicine | Admitting: Emergency Medicine

## 2013-05-23 ENCOUNTER — Emergency Department (HOSPITAL_COMMUNITY): Payer: Medicare Other

## 2013-05-23 ENCOUNTER — Encounter (HOSPITAL_COMMUNITY): Payer: Self-pay | Admitting: Emergency Medicine

## 2013-05-23 DIAGNOSIS — Z79899 Other long term (current) drug therapy: Secondary | ICD-10-CM | POA: Insufficient documentation

## 2013-05-23 DIAGNOSIS — K219 Gastro-esophageal reflux disease without esophagitis: Secondary | ICD-10-CM | POA: Insufficient documentation

## 2013-05-23 DIAGNOSIS — F0390 Unspecified dementia without behavioral disturbance: Secondary | ICD-10-CM | POA: Insufficient documentation

## 2013-05-23 DIAGNOSIS — J449 Chronic obstructive pulmonary disease, unspecified: Secondary | ICD-10-CM | POA: Insufficient documentation

## 2013-05-23 DIAGNOSIS — Z8739 Personal history of other diseases of the musculoskeletal system and connective tissue: Secondary | ICD-10-CM | POA: Insufficient documentation

## 2013-05-23 DIAGNOSIS — E039 Hypothyroidism, unspecified: Secondary | ICD-10-CM | POA: Insufficient documentation

## 2013-05-23 DIAGNOSIS — R51 Headache: Secondary | ICD-10-CM | POA: Insufficient documentation

## 2013-05-23 DIAGNOSIS — I10 Essential (primary) hypertension: Secondary | ICD-10-CM | POA: Insufficient documentation

## 2013-05-23 DIAGNOSIS — R519 Headache, unspecified: Secondary | ICD-10-CM

## 2013-05-23 DIAGNOSIS — J4489 Other specified chronic obstructive pulmonary disease: Secondary | ICD-10-CM | POA: Insufficient documentation

## 2013-05-23 MED ORDER — ACETAMINOPHEN 325 MG PO TABS
650.0000 mg | ORAL_TABLET | Freq: Once | ORAL | Status: AC
  Administered 2013-05-23: 650 mg via ORAL
  Filled 2013-05-23: qty 2

## 2013-05-23 NOTE — ED Notes (Addendum)
Per EMS-pt [facility] with c/o of headache. Hx dementia.

## 2013-05-23 NOTE — ED Provider Notes (Signed)
CSN: [id number]     Arrival date & time 05/23/13  1127 History   First MD Initiated Contact with Patient 05/23/13 1128     Chief Complaint  Patient presents with  . Headache     (Consider location/radiation/quality/duration/timing/severity/associated sxs/prior Treatment) HPI Comments: Patient is a 80 yo F PMHx significant for hypothyroidism, esophageal reflux, dementia, hyperlipidemia presenting to the ED for a headache that started this morning while at breakfast. Per the nursing home patient was eating breakfast when she stated she developed a headache. She had no other associated symptoms. No signs of aphasia, facial drooping, unilateral weakness at that time. Patient has had headaches in the past, noted to be resolved with caffeine. Does not sound like patient received anything PTA. Patient is at baseline per nursing home and EMS staff. Patient is a level V caveat d/t dementia.    Patient is a 80 y.o. female presenting with headaches. The history is provided by the patient, the EMS personnel and the nursing home.  Headache   Past Medical History  Diagnosis Date  . Unspecified hypothyroidism   . Disorder of bone and cartilage, unspecified   . Chronic airway obstruction, not elsewhere classified   . Unspecified essential hypertension   . Esophageal reflux   . Other and unspecified hyperlipidemia   . Dementia    Past Surgical History  Procedure Laterality Date  . Cataract extractions      bilateral  . Abdominal hysterectomy      total  . Bilateral salpingoopherectomy    . Rectal polyp removal      No malignancy identified   Family History  Problem Relation Age of Onset  . Heart failure Mother   . Cancer Father    History  Substance Use Topics  . Smoking status: Never Smoker   . Smokeless tobacco: Never Used  . Alcohol Use: No   OB History   Grav Para Term Preterm Abortions TAB SAB Ect Mult Living                 Review of Systems  Unable to perform ROS: Dementia   Neurological: Positive for headaches.      Allergies  Review of patient's allergies indicates no known allergies.  Home Medications   Prior to Admission medications   Medication Sig Start Date End Date Taking? Authorizing Provider  acetaminophen (TYLENOL) 325 MG tablet Take 325 mg by mouth 2 (two) times daily.    Historical Provider, MD  ciprofloxacin (CIPRO) 500 MG tablet Take 1 tablet (500 mg total) by mouth 2 (two) times daily. One po bid x 7 days 03/03/13   Richardean Canalavid H Yao, MD  clotrimazole (LOTRIMIN) 1 % cream Apply 1 application topically 2 (two) times daily.    Historical Provider, MD  Cranberry 500 MG CAPS Take 500 mg by mouth every morning.     Historical Provider, MD  docusate sodium (COLACE) 100 MG capsule Take 1 capsule (100 mg total) by mouth every 12 (twelve) hours. 08/06/12   Gilda Creasehristopher J. Pollina, MD  enalapril (VASOTEC) 2.5 MG tablet Take 1 tablet (2.5 mg total) by mouth every morning. 09/15/12   Jacques NavyMichael E [name], MD  Eyelid Cleansers (OCUSOFT LID SCRUB EX) Apply 1 application topically daily. Use to clean both eyes every day    Historical Provider, MD  ferrous sulfate 325 (65 FE) MG tablet Take 325 mg by mouth daily with breakfast.    Historical Provider, MD  levothyroxine (SYNTHROID, LEVOTHROID) 50 MCG tablet Take 50 mcg  by mouth daily before breakfast.    Historical Provider, MD  omeprazole (PRILOSEC) 20 MG capsule Take 40 mg by mouth every morning.     Historical Provider, MD  OVER THE COUNTER MEDICATION Take 1 tablet by mouth daily. Therapy M 9mg -400mcg    Historical Provider, MD  triamcinolone cream (KENALOG) 0.1 % Apply 1 application topically 2 (two) times daily.    Historical Provider, MD   BP 153/76  Pulse 98  Temp(Src) 98.9 F (37.2 C) (Oral)  Resp 19  SpO2 98% Physical Exam  Nursing note and vitals reviewed. Constitutional: She appears well-developed and well-nourished. No distress.  HENT:  Head: Normocephalic and atraumatic.  Right Ear: External ear  normal.  Left Ear: External ear normal.  Nose: Nose normal.  Mouth/Throat: Oropharynx is clear and moist. No oropharyngeal exudate.  Eyes: Conjunctivae and EOM are normal. Pupils are equal, round, and reactive to light.  Neck: Normal range of motion and full passive range of motion without pain. Neck supple. No spinous process tenderness and no muscular tenderness present.  Cardiovascular: Normal rate, regular rhythm, normal heart sounds and intact distal pulses.   Pulmonary/Chest: Effort normal and breath sounds normal. No respiratory distress.  Abdominal: Soft. Bowel sounds are normal. There is no tenderness.  Musculoskeletal: Normal range of motion. She exhibits no edema and no tenderness.  Neurological: She is alert. She has normal strength. No cranial nerve deficit or sensory deficit. Gait normal. GCS eye subscore is 4. GCS verbal subscore is 5. GCS motor subscore is 6.  Oriented to self, place, and situation but not to time, baseline for patient.  Sensation grossly intact.  No pronator drift.  Bilateral heel-knee-shin intact.  Skin: Skin is warm and dry. She is not diaphoretic.    ED Course  Procedures (including critical care time) Medications  acetaminophen (TYLENOL) tablet 650 mg (650 mg Oral Given 05/23/13 1231)    Labs Review Labs Reviewed - No data to display  Imaging Review Ct Head Wo Contrast  05/23/2013   CLINICAL DATA:  Headache, dementia  EXAM: CT HEAD WITHOUT CONTRAST  TECHNIQUE: Contiguous axial images were obtained from the base of the skull through the vertex without intravenous contrast.  COMPARISON:  08/06/2012  FINDINGS: No evidence of parenchymal hemorrhage or extra-axial fluid collection. No mass lesion, mass effect, or midline shift.  No CT evidence of acute infarction. Old right basal ganglia lacunar infarct.  Subcortical white matter and periventricular small vessel ischemic changes. Intracranial atherosclerosis.  Mild global cortical atrophy, likely age  appropriate. No ventriculomegaly.  The visualized paranasal sinuses are essentially clear. The mastoid air cells are unopacified.  No evidence of calvarial fracture.  IMPRESSION: No evidence of acute intracranial abnormality.  Old right basal ganglia lacunar infarct.  Age related atrophy with small vessel ischemic changes and intracranial atherosclerosis.   Electronically Signed   By: Charline BillsSriyesh  Krishnan M.D.   On: 05/23/2013 13:30     EKG Interpretation None      MDM   Final diagnoses:  Headache    Filed Vitals:   05/23/13 1446  BP: 153/76  Pulse: 98  Temp:   Resp: 19   Afebrile, NAD, non-toxic appearing, AAOx3 at baseline. No neurofocal deficits. CT scan negative. Pt HA treated and improved while in ED.  Presentation is like pts typical HA and non concerning for [facility], ICH, Meningitis, or temporal arteritis. Pt is afebrile with no focal neuro deficits, nuchal rigidity, or change in vision. Pt is to follow up with PCP.  Patient d/w with Dr. Patria Mane, agrees with plan. Patient is stable at time of discharge         Jeannetta Ellis, PA-C 05/23/13 1457

## 2013-05-23 NOTE — ED Notes (Signed)
Bed: WA01 Expected date:  Expected time:  Means of arrival:  Comments: EMS- 80yr old F, headache

## 2013-05-23 NOTE — Discharge Instructions (Signed)
Please follow up with your primary care physician in 1-2 days. If you do not have one please call the [facility] number listed above. Please read all discharge instructions and return precautions.    Headaches, Frequently Asked Questions MIGRAINE HEADACHES Q: What is migraine? What causes it? How can I treat it? A: Generally, migraine headaches begin as a dull ache. Then they develop into a constant, throbbing, and pulsating pain. You may experience pain at the temples. You may experience pain at the front or back of one or both sides of the head. The pain is usually accompanied by a combination of:  Nausea.  Vomiting.  Sensitivity to light and noise. Some people (about 15%) experience an aura (see below) before an attack. The cause of migraine is believed to be chemical reactions in the brain. Treatment for migraine may include over-the-counter or prescription medications. It may also include self-help techniques. These include relaxation training and biofeedback.  Q: What is an aura? A: About 15% of people with migraine get an "aura". This is a sign of neurological symptoms that occur before a migraine headache. You may see wavy or jagged lines, dots, or flashing lights. You might experience tunnel vision or blind spots in one or both eyes. The aura can include visual or auditory hallucinations (something imagined). It may include disruptions in smell (such as strange odors), taste or touch. Other symptoms include:  Numbness.  A "pins and needles" sensation.  Difficulty in recalling or speaking the correct word. These neurological events may last as long as 60 minutes. These symptoms will fade as the headache begins. Q: What is a trigger? A: Certain physical or environmental factors can lead to or "trigger" a migraine. These include:  Foods.  Hormonal changes.  Weather.  Stress. It is important to remember that triggers are different for everyone. To help  prevent migraine attacks, you need to figure out which triggers affect you. Keep a headache diary. This is a good way to track triggers. The diary will help you talk to your healthcare professional about your condition. Q: Does weather affect migraines? A: Bright sunshine, hot, humid conditions, and drastic changes in barometric pressure may lead to, or "trigger," a migraine attack in some people. But studies have shown that weather does not act as a trigger for everyone with migraines. Q: What is the link between migraine and hormones? A: Hormones start and regulate many of your body's functions. Hormones keep your body in balance within a constantly changing environment. The levels of hormones in your body are unbalanced at times. Examples are during menstruation, pregnancy, or menopause. That can lead to a migraine attack. In fact, about three quarters of all women with migraine report that their attacks are related to the menstrual cycle.  Q: Is there an increased risk of stroke for migraine sufferers? A: The likelihood of a migraine attack causing a stroke is very remote. That is not to say that migraine sufferers cannot have a stroke associated with their migraines. In persons under age 39, the most common associated factor for stroke is migraine headache. But over the course of a person's normal life span, the occurrence of migraine headache may actually be associated with a reduced risk of dying from cerebrovascular disease due to stroke.  Q: What are acute medications for migraine? A: Acute medications are used to treat the pain of the headache after it has started. Examples over-the-counter medications, NSAIDs, ergots, and triptans.  Q: What are  the triptans? A: Triptans are the newest class of abortive medications. They are specifically targeted to treat migraine. Triptans are vasoconstrictors. They moderate some chemical reactions in the brain. The triptans work on receptors in your brain.  Triptans help to restore the balance of a neurotransmitter called serotonin. Fluctuations in levels of serotonin are thought to be a main cause of migraine.  Q: Are over-the-counter medications for migraine effective? A: Over-the-counter, or "OTC," medications may be effective in relieving mild to moderate pain and associated symptoms of migraine. But you should see your caregiver before beginning any treatment regimen for migraine.  Q: What are preventive medications for migraine? A: Preventive medications for migraine are sometimes referred to as "prophylactic" treatments. They are used to reduce the frequency, severity, and length of migraine attacks. Examples of preventive medications include antiepileptic medications, antidepressants, beta-blockers, calcium channel blockers, and NSAIDs (nonsteroidal anti-inflammatory drugs). Q: Why are anticonvulsants used to treat migraine? A: During the past few years, there has been an increased interest in antiepileptic drugs for the prevention of migraine. They are sometimes referred to as "anticonvulsants". Both epilepsy and migraine may be caused by similar reactions in the brain.  Q: Why are antidepressants used to treat migraine? A: Antidepressants are typically used to treat people with depression. They may reduce migraine frequency by regulating chemical levels, such as serotonin, in the brain.  Q: What alternative therapies are used to treat migraine? A: The term "alternative therapies" is often used to describe treatments considered outside the scope of conventional Western medicine. Examples of alternative therapy include acupuncture, acupressure, and yoga. Another common alternative treatment is herbal therapy. Some herbs are believed to relieve headache pain. Always discuss alternative therapies with your caregiver before proceeding. Some herbal products contain arsenic and other toxins. TENSION HEADACHES Q: What is a tension-type headache? What  causes it? How can I treat it? A: Tension-type headaches occur randomly. They are often the result of temporary stress, anxiety, fatigue, or anger. Symptoms include soreness in your temples, a tightening band-like sensation around your head (a "vice-like" ache). Symptoms can also include a pulling feeling, pressure sensations, and contracting head and neck muscles. The headache begins in your forehead, temples, or the back of your head and neck. Treatment for tension-type headache may include over-the-counter or prescription medications. Treatment may also include self-help techniques such as relaxation training and biofeedback. CLUSTER HEADACHES Q: What is a cluster headache? What causes it? How can I treat it? A: Cluster headache gets its name because the attacks come in groups. The pain arrives with little, if any, warning. It is usually on one side of the head. A tearing or bloodshot eye and a runny nose on the same side of the headache may also accompany the pain. Cluster headaches are believed to be caused by chemical reactions in the brain. They have been described as the most severe and intense of any headache type. Treatment for cluster headache includes prescription medication and oxygen. SINUS HEADACHES Q: What is a sinus headache? What causes it? How can I treat it? A: When a cavity in the bones of the face and skull (a sinus) becomes inflamed, the inflammation will cause localized pain. This condition is usually the result of an allergic reaction, a tumor, or an infection. If your headache is caused by a sinus blockage, such as an infection, you will probably have a fever. An x-ray will confirm a sinus blockage. Your caregiver's treatment might include antibiotics for the infection, as well as  antihistamines or decongestants.  REBOUND HEADACHES Q: What is a rebound headache? What causes it? How can I treat it? A: A pattern of taking acute headache medications too often can lead to a condition  known as "rebound headache." A pattern of taking too much headache medication includes taking it more than 2 days per week or in excessive amounts. That means more than the label or a caregiver advises. With rebound headaches, your medications not only stop relieving pain, they actually begin to cause headaches. Doctors treat rebound headache by tapering the medication that is being overused. Sometimes your caregiver will gradually substitute a different type of treatment or medication. Stopping may be a challenge. Regularly overusing a medication increases the potential for serious side effects. Consult a caregiver if you regularly use headache medications more than 2 days per week or more than the label advises. ADDITIONAL QUESTIONS AND ANSWERS Q: What is biofeedback? A: Biofeedback is a self-help treatment. Biofeedback uses special equipment to monitor your body's involuntary physical responses. Biofeedback monitors:  Breathing.  Pulse.  Heart rate.  Temperature.  Muscle tension.  Brain activity. Biofeedback helps you refine and perfect your relaxation exercises. You learn to control the physical responses that are related to stress. Once the technique has been mastered, you do not need the equipment any more. Q: Are headaches hereditary? A: Four out of five (80%) of people that suffer report a family history of migraine. Scientists are not sure if this is genetic or a family predisposition. Despite the uncertainty, a child has a 50% chance of having migraine if one parent suffers. The child has a 75% chance if both parents suffer.  Q: Can children get headaches? A: By the time they reach high school, most young people have experienced some type of headache. Many safe and effective approaches or medications can prevent a headache from occurring or stop it after it has begun.  Q: What type of doctor should I see to diagnose and treat my headache? A: Start with your primary caregiver. Discuss  his or her experience and approach to headaches. Discuss methods of classification, diagnosis, and treatment. Your caregiver may decide to recommend you to a headache specialist, depending upon your symptoms or other physical conditions. Having diabetes, allergies, etc., may require a more comprehensive and inclusive approach to your headache. The [employer] will provide, upon request, a list of [facility] physician members in your state. Document Released: 04/06/2003 Document Revised: 04/08/2011 Document Reviewed: 09/14/2007 [facility] Patient Information 2014 [place], [place].

## 2013-05-23 NOTE — ED Provider Notes (Signed)
Medical screening examination/treatment/procedure(s) were conducted as a shared visit with non-physician practitioner(s) and myself.  I personally evaluated the patient during the encounter.   EKG Interpretation None      Mild headache resolve this time.  Head CT normal.  Discharge home in good condition.  Lyanne CoKevin M Emmalynn Pinkham, MD 05/23/13 [phone]

## 2013-10-12 ENCOUNTER — Encounter (HOSPITAL_COMMUNITY): Payer: Self-pay | Admitting: Emergency Medicine

## 2013-10-12 ENCOUNTER — Emergency Department (HOSPITAL_COMMUNITY): Payer: Medicare Other

## 2013-10-12 ENCOUNTER — Emergency Department (HOSPITAL_COMMUNITY)
Admission: EM | Admit: 2013-10-12 | Discharge: 2013-10-13 | Disposition: A | Discharge status: Home / Self Care | Payer: Medicare Other | Attending: Emergency Medicine | Admitting: Emergency Medicine

## 2013-10-12 DIAGNOSIS — F0390 Unspecified dementia without behavioral disturbance: Secondary | ICD-10-CM | POA: Diagnosis not present

## 2013-10-12 DIAGNOSIS — Z23 Encounter for immunization: Secondary | ICD-10-CM | POA: Insufficient documentation

## 2013-10-12 DIAGNOSIS — S0100XA Unspecified open wound of scalp, initial encounter: Secondary | ICD-10-CM | POA: Insufficient documentation

## 2013-10-12 DIAGNOSIS — E039 Hypothyroidism, unspecified: Secondary | ICD-10-CM | POA: Insufficient documentation

## 2013-10-12 DIAGNOSIS — W06XXXA Fall from bed, initial encounter: Secondary | ICD-10-CM | POA: Insufficient documentation

## 2013-10-12 DIAGNOSIS — Z8739 Personal history of other diseases of the musculoskeletal system and connective tissue: Secondary | ICD-10-CM | POA: Diagnosis not present

## 2013-10-12 DIAGNOSIS — K219 Gastro-esophageal reflux disease without esophagitis: Secondary | ICD-10-CM | POA: Insufficient documentation

## 2013-10-12 DIAGNOSIS — Z79899 Other long term (current) drug therapy: Secondary | ICD-10-CM | POA: Diagnosis not present

## 2013-10-12 DIAGNOSIS — J4489 Other specified chronic obstructive pulmonary disease: Secondary | ICD-10-CM | POA: Insufficient documentation

## 2013-10-12 DIAGNOSIS — S12100A Unspecified displaced fracture of second cervical vertebra, initial encounter for closed fracture: Secondary | ICD-10-CM | POA: Insufficient documentation

## 2013-10-12 DIAGNOSIS — Y939 Activity, unspecified: Secondary | ICD-10-CM | POA: Diagnosis not present

## 2013-10-12 DIAGNOSIS — Y9210 Unspecified residential institution as the place of occurrence of the external cause: Secondary | ICD-10-CM | POA: Diagnosis not present

## 2013-10-12 DIAGNOSIS — J449 Chronic obstructive pulmonary disease, unspecified: Secondary | ICD-10-CM | POA: Insufficient documentation

## 2013-10-12 DIAGNOSIS — S0101XA Laceration without foreign body of scalp, initial encounter: Secondary | ICD-10-CM

## 2013-10-12 DIAGNOSIS — I10 Essential (primary) hypertension: Secondary | ICD-10-CM | POA: Insufficient documentation

## 2013-10-12 MED ORDER — TETANUS-DIPHTH-ACELL PERTUSSIS 5-2.5-18.5 LF-MCG/0.5 IM SUSP
0.5000 mL | Freq: Once | INTRAMUSCULAR | Status: AC
  Administered 2013-10-13: 0.5 mL via INTRAMUSCULAR
  Filled 2013-10-12: qty 0.5

## 2013-10-12 MED ORDER — ACETAMINOPHEN 325 MG PO TABS
650.0000 mg | ORAL_TABLET | Freq: Once | ORAL | Status: AC
  Administered 2013-10-13: 650 mg via ORAL
  Filled 2013-10-12: qty 2

## 2013-10-12 NOTE — ED Notes (Addendum)
Pt from [facility]. Pt has hx of dementia. Pt rolled out of bed onto the floor hitting her head. Fall was unwittnessed and LOC unknown. Pt has a head lac on the right side. Bleeding controlled before EMS arrival. Pt also has a left hip protrusion, which is normal.

## 2013-10-12 NOTE — ED Provider Notes (Signed)
CSN: [id number]     Arrival date & time 10/12/13  2240 History   First MD Initiated Contact with Patient 10/12/13 2300     Chief Complaint  Patient presents with  . Fall     (Consider location/radiation/quality/duration/timing/severity/associated sxs/prior Treatment) The history is provided by the patient.  [name] is a 80 y.o. female hx of HTN, reflux, dementia here with fall. She rolled out of bed and hit her head. She lives at [facility] assisted [facility] memory care unit. Staff noted R scalp laceration with hematoma. Patient not on blood thinners. Also has L hip protrusion which was noted to be chronic. Patient demented and unable to give history.   Level V caveat- dementia    Past Medical History  Diagnosis Date  . Unspecified hypothyroidism   . Disorder of bone and cartilage, unspecified   . Chronic airway obstruction, not elsewhere classified   . Unspecified essential hypertension   . Esophageal reflux   . Other and unspecified hyperlipidemia   . Dementia    Past Surgical History  Procedure Laterality Date  . Cataract extractions      bilateral  . Abdominal hysterectomy      total  . Bilateral salpingoopherectomy    . Rectal polyp removal      No malignancy identified   Family History  Problem Relation Age of Onset  . Heart failure Mother   . Cancer Father    History  Substance Use Topics  . Smoking status: Never Smoker   . Smokeless tobacco: Never Used  . Alcohol Use: No   OB History   Grav Para Term Preterm Abortions TAB SAB Ect Mult Living                 Review of Systems  Skin: Positive for wound.  All other systems reviewed and are negative.     Allergies  Review of patient's allergies indicates no known allergies.  Home Medications   Prior to Admission medications   Medication Sig Start Date End Date Taking? Authorizing Provider  acetaminophen (TYLENOL) 325 MG tablet Take 325 mg by mouth 2 (two) times daily.   Yes Historical  Provider, MD  Cranberry 500 MG CAPS Take 500 mg by mouth every morning.    Yes Historical Provider, MD  diclofenac sodium (VOLTAREN) 1 % GEL Apply 2 g topically 2 (two) times daily. Applied to right shoulder   Yes Historical Provider, MD  Docusate Sodium (DOCU) 150 MG/15ML syrup Take 200 mg by mouth 2 (two) times daily.   Yes Historical Provider, MD  enalapril (VASOTEC) 2.5 MG tablet Take 1 tablet (2.5 mg total) by mouth every morning. 09/15/12  Yes Jacques Navy, MD  Eyelid Cleansers (OCUSOFT LID SCRUB EX) Apply 1 application topically daily. Use to clean both eyes every day   Yes Historical Provider, MD  ferrous sulfate 325 (65 FE) MG tablet Take 325 mg by mouth daily with breakfast.   Yes Historical Provider, MD  levothyroxine (SYNTHROID, LEVOTHROID) 75 MCG tablet Take 75 mcg by mouth daily before breakfast.   Yes Historical Provider, MD  Nutritional Supplements (NUTRITIONAL DRINK PO) Take 237 mLs by mouth 3 (three) times daily. House shakes   Yes Historical Provider, MD  omeprazole (PRILOSEC) 20 MG capsule Take 20 mg by mouth daily.    Yes Historical Provider, MD  OVER THE COUNTER MEDICATION Take 1 tablet by mouth daily. Therapy M -   Yes Historical Provider, MD  triamcinolone cream (KENALOG) 0.1 %  Apply 1 application topically 2 (two) times daily. Applied to face, arms, hands and legs.   Yes Historical Provider, MD  clotrimazole (LOTRIMIN) 1 % cream Apply 1 application topically 2 (two) times daily as needed (to affected skin).     Historical Provider, MD  ondansetron (ZOFRAN) 4 MG tablet Take 4 mg by mouth every 6 (six) hours as needed for nausea or vomiting.    Historical Provider, MD   BP 166/74  Pulse 83  Temp(Src) 98.3 F (36.8 C) (Oral)  Resp 18  SpO2 97% Physical Exam  Nursing note and vitals reviewed. Constitutional:  Demented   HENT:  Head: Normocephalic.  Mouth/Throat: Oropharynx is clear and moist.  R scalp area with 1 cm laceration with hematoma   Eyes:  Conjunctivae and EOM are normal. Pupils are equal, round, and reactive to light.  Neck: Normal range of motion. Neck supple.  Cardiovascular: Normal rate, regular rhythm and normal heart sounds.   Pulmonary/Chest: Effort normal and breath sounds normal. No respiratory distress. She has no wheezes. She has no rales.  Abdominal: Soft. Bowel sounds are normal. She exhibits no distension. There is no tenderness. There is no rebound and no guarding.  Musculoskeletal:  Dec ROM bilateral hips, worse L hip. No obvious deformity. No other obvious extremity trauma   Neurological:  Demented, mental status baseline per family. Moving all extremities   Skin: Skin is warm and dry.  Psychiatric:  Unable     ED Course  Procedures (including critical care time)  LACERATION REPAIR Performed by: Chaney Malling Authorized by: Silverio Lay, Sante Biedermann Consent: Verbal consent obtained. Risks and benefits: risks, benefits and alternatives were discussed Consent given by: patient Patient identity confirmed: provided demographic data Prepped and Draped in normal sterile fashion Wound explored  Laceration Location: R temporal  Laceration Length: 1 cm  No Foreign Bodies seen or palpated  Anesthesia: none   Irrigation method: syringe Amount of cleaning: standard  Skin closure: staple  Number of staple: 3     Patient tolerance: Patient tolerated the procedure well with no immediate complications.   Labs Review Labs Reviewed - No data to display  Imaging Review Dg Hip Complete Left  10/13/2013   CLINICAL DATA:  Status post fall; left hip pain.  EXAM: LEFT HIP - COMPLETE 2+ VIEW  COMPARISON:  Left hip radiographs performed 05/11/2012  FINDINGS: There is no evidence of fracture or dislocation. Both femoral heads are seated normally within their respective acetabula. The proximal left femur appears intact. No significant degenerative change is appreciated. The sacroiliac joints are unremarkable in appearance.  The  visualized bowel gas pattern is grossly unremarkable in appearance. The colon is largely filled with stool.  IMPRESSION: 1. No evidence of fracture or dislocation. 2. Colon largely filled with stool, raising concern for mild constipation.   Electronically Signed   By: Roanna Raider M.D.   On: 10/13/2013 00:37   Ct Head Wo Contrast  10/13/2013   CLINICAL DATA:  Rolled out of bed. Laceration to the right side of the head. Concern for cervical spine injury.  EXAM: CT HEAD WITHOUT CONTRAST  CT CERVICAL SPINE WITHOUT CONTRAST  TECHNIQUE: Multidetector CT imaging of the head and cervical spine was performed following the standard protocol without intravenous contrast. Multiplanar CT image reconstructions of the cervical spine were also generated.  COMPARISON:  CT of the cervical spine performed 07/13/2012, and CT of the head performed 05/23/2013  FINDINGS: CT HEAD FINDINGS  There is no evidence of acute infarction,  mass lesion, or intra- or extra-axial hemorrhage on CT.  Prominence of the ventricles and sulci reflects moderate cortical volume loss. Mild cerebellar atrophy is noted. Diffuse periventricular and subcortical white matter change likely reflects small vessel ischemic microangiopathy. A chronic lacunar infarct is noted at the right basal ganglia.  The brainstem and fourth ventricle are within normal limits. The cerebral hemispheres demonstrate grossly normal gray-white differentiation. No mass effect or midline shift is seen.  There is no evidence of fracture; visualized osseous structures are unremarkable in appearance. The orbits are within normal limits. The paranasal sinuses and mastoid air cells are well-aerated. No significant soft tissue abnormalities are seen.  CT CERVICAL SPINE FINDINGS  There is a displaced type 2 fracture through the base of the dens, with mild anterior displacement of the body of C2 and approximately 7 mm of separation at the fracture site. Surrounding mild hemorrhage is noted,  with narrowing of the cervical canal to 1.0 cm in AP dimension on sagittal images. There is no definite evidence of impression on the spinal cord at this time.  Multilevel disc space narrowing is noted along the cervical spine, with scattered small posterior disc osteophyte complexes. Evaluation is mildly suboptimal due to motion artifact. Prevertebral soft tissues are within normal limits.  The visualized portions of the thyroid gland are unremarkable in appearance. Scarring is noted at the lung apices. Dense calcification is seen at the carotid bifurcations bilaterally.  IMPRESSION: 1. No evidence of traumatic intracranial injury. 2. Displaced type 2 fracture through the base of the dens (C2), with mild anterior displacement of the body of C2 and 7 mm of separation at the fracture site. Surrounding mild hemorrhage noted, with narrowing of the cervical canal to 1.0 cm in AP dimension on sagittal images. No definite evidence of impression on the spinal cord at this time. 3. Moderate cortical volume loss and diffuse small vessel ischemic microangiopathy. Chronic lacunar infarct at the right basal ganglia. 4. Mild degenerative change noted along the cervical spine. 5. Scarring at the lung apices. 6. Dense calcification at the carotid bifurcations bilaterally. Carotid ultrasound would be helpful for further evaluation, when and as deemed clinically appropriate.  These results were called by telephone at the time of interpretation on 10/13/2013 at 1:02 am to Dr. Chaney Malling , who verbally acknowledged these results.   Electronically Signed   By: Roanna Raider M.D.   On: 10/13/2013 01:02   Ct Cervical Spine Wo Contrast  10/13/2013   CLINICAL DATA:  Rolled out of bed. Laceration to the right side of the head. Concern for cervical spine injury.  EXAM: CT HEAD WITHOUT CONTRAST  CT CERVICAL SPINE WITHOUT CONTRAST  TECHNIQUE: Multidetector CT imaging of the head and cervical spine was performed following the standard  protocol without intravenous contrast. Multiplanar CT image reconstructions of the cervical spine were also generated.  COMPARISON:  CT of the cervical spine performed 07/13/2012, and CT of the head performed 05/23/2013  FINDINGS: CT HEAD FINDINGS  There is no evidence of acute infarction, mass lesion, or intra- or extra-axial hemorrhage on CT.  Prominence of the ventricles and sulci reflects moderate cortical volume loss. Mild cerebellar atrophy is noted. Diffuse periventricular and subcortical white matter change likely reflects small vessel ischemic microangiopathy. A chronic lacunar infarct is noted at the right basal ganglia.  The brainstem and fourth ventricle are within normal limits. The cerebral hemispheres demonstrate grossly normal gray-white differentiation. No mass effect or midline shift is seen.  There is no  evidence of fracture; visualized osseous structures are unremarkable in appearance. The orbits are within normal limits. The paranasal sinuses and mastoid air cells are well-aerated. No significant soft tissue abnormalities are seen.  CT CERVICAL SPINE FINDINGS  There is a displaced type 2 fracture through the base of the dens, with mild anterior displacement of the body of C2 and approximately 7 mm of separation at the fracture site. Surrounding mild hemorrhage is noted, with narrowing of the cervical canal to 1.0 cm in AP dimension on sagittal images. There is no definite evidence of impression on the spinal cord at this time.  Multilevel disc space narrowing is noted along the cervical spine, with scattered small posterior disc osteophyte complexes. Evaluation is mildly suboptimal due to motion artifact. Prevertebral soft tissues are within normal limits.  The visualized portions of the thyroid gland are unremarkable in appearance. Scarring is noted at the lung apices. Dense calcification is seen at the carotid bifurcations bilaterally.  IMPRESSION: 1. No evidence of traumatic intracranial  injury. 2. Displaced type 2 fracture through the base of the dens (C2), with mild anterior displacement of the body of C2 and 7 mm of separation at the fracture site. Surrounding mild hemorrhage noted, with narrowing of the cervical canal to 1.0 cm in AP dimension on sagittal images. No definite evidence of impression on the spinal cord at this time. 3. Moderate cortical volume loss and diffuse small vessel ischemic microangiopathy. Chronic lacunar infarct at the right basal ganglia. 4. Mild degenerative change noted along the cervical spine. 5. Scarring at the lung apices. 6. Dense calcification at the carotid bifurcations bilaterally. Carotid ultrasound would be helpful for further evaluation, when and as deemed clinically appropriate.  These results were called by telephone at the time of interpretation on 10/13/2013 at 1:02 am to Dr. Chaney Malling , who verbally acknowledged these results.   Electronically Signed   By: Roanna Raider M.D.   On: 10/13/2013 01:02     EKG Interpretation None      MDM   Final diagnoses:  None    [name] is a 80 y.o. female here with fall. Will get CT head/neck, xrays. Will staple laceration.   2:10 AM Laceration stapled. CT neck showed C2 fracture. I called Dr. Danielle Dess, neurosurgery, who reviewed CT findings. Given age and demented, she is a poor surgical candidate. Given that she is moving all extremities and has unremarkable neuro exam, he recommend d/c with hard collar. Will d/c with short course of vicodin.        Richardean Canal, MD 10/13/13 [phone]

## 2013-10-12 NOTE — ED Notes (Signed)
Bed: WU98 Expected date: 10/12/13 Expected time: 10:28 PM Means of arrival: Ambulance Comments: Fall, head laceration

## 2013-10-13 DIAGNOSIS — S0100XA Unspecified open wound of scalp, initial encounter: Secondary | ICD-10-CM | POA: Diagnosis not present

## 2013-10-13 MED ORDER — HYDROCODONE-ACETAMINOPHEN 5-325 MG PO TABS: 1.0000 | ORAL_TABLET | Freq: Four times a day (QID) | ORAL | Status: DC | PRN

## 2013-10-13 NOTE — ED Notes (Signed)
[facility] called for transport.  

## 2013-10-13 NOTE — Discharge Instructions (Signed)
Keep cervical collar on at ALL times.   You can continue tylenol as needed for mild pain. Take vicodin for severe pain.   Please stay in wheelchair.   You need to get staple removal in 7-10 days.   You need to see Dr. Danielle Dess in 1-2 weeks for repeat xray.   Return to ER if you have trouble moving her arms or legs, falls.

## 2013-11-22 ENCOUNTER — Emergency Department (HOSPITAL_COMMUNITY)
Admission: EM | Admit: 2013-11-22 | Discharge: 2013-11-22 | Disposition: A | Discharge status: Home / Self Care | Payer: Medicare Other | Attending: Emergency Medicine | Admitting: Emergency Medicine

## 2013-11-22 ENCOUNTER — Emergency Department (HOSPITAL_COMMUNITY): Payer: Medicare Other

## 2013-11-22 DIAGNOSIS — Y92129 Unspecified place in nursing home as the place of occurrence of the external cause: Secondary | ICD-10-CM | POA: Insufficient documentation

## 2013-11-22 DIAGNOSIS — Z791 Long term (current) use of non-steroidal anti-inflammatories (NSAID): Secondary | ICD-10-CM | POA: Diagnosis not present

## 2013-11-22 DIAGNOSIS — Z8781 Personal history of (healed) traumatic fracture: Secondary | ICD-10-CM | POA: Diagnosis not present

## 2013-11-22 DIAGNOSIS — F0390 Unspecified dementia without behavioral disturbance: Secondary | ICD-10-CM | POA: Insufficient documentation

## 2013-11-22 DIAGNOSIS — J449 Chronic obstructive pulmonary disease, unspecified: Secondary | ICD-10-CM | POA: Diagnosis not present

## 2013-11-22 DIAGNOSIS — E039 Hypothyroidism, unspecified: Secondary | ICD-10-CM | POA: Diagnosis not present

## 2013-11-22 DIAGNOSIS — Y9389 Activity, other specified: Secondary | ICD-10-CM | POA: Insufficient documentation

## 2013-11-22 DIAGNOSIS — Z79899 Other long term (current) drug therapy: Secondary | ICD-10-CM | POA: Diagnosis not present

## 2013-11-22 DIAGNOSIS — S0081XA Abrasion of other part of head, initial encounter: Secondary | ICD-10-CM | POA: Diagnosis not present

## 2013-11-22 DIAGNOSIS — I10 Essential (primary) hypertension: Secondary | ICD-10-CM | POA: Insufficient documentation

## 2013-11-22 DIAGNOSIS — Z7952 Long term (current) use of systemic steroids: Secondary | ICD-10-CM | POA: Insufficient documentation

## 2013-11-22 DIAGNOSIS — E785 Hyperlipidemia, unspecified: Secondary | ICD-10-CM | POA: Diagnosis not present

## 2013-11-22 DIAGNOSIS — K219 Gastro-esophageal reflux disease without esophagitis: Secondary | ICD-10-CM | POA: Insufficient documentation

## 2013-11-22 DIAGNOSIS — W07XXXA Fall from chair, initial encounter: Secondary | ICD-10-CM | POA: Diagnosis not present

## 2013-11-22 DIAGNOSIS — W19XXXA Unspecified fall, initial encounter: Secondary | ICD-10-CM

## 2013-11-22 DIAGNOSIS — S0993XA Unspecified injury of face, initial encounter: Secondary | ICD-10-CM | POA: Diagnosis present

## 2013-11-22 NOTE — ED Notes (Signed)
Patient transported to CT 

## 2013-11-22 NOTE — ED Provider Notes (Signed)
Medical screening examination/treatment/procedure(s) were conducted as a shared visit with non-physician practitioner(s) and myself.  I personally evaluated the patient during the encounter.   EKG Interpretation None     Patient here after having a fall at the nursing home where she fell from a chair. No loss of consciousness. Does have a left temporal abrasion. Head CT and neck CT without acute findings. Stable for discharge  Toy BakerAnthony T Nyliah Nierenberg, MD 11/22/13 [phone]

## 2013-11-22 NOTE — ED Notes (Signed)
MD at bedside. EDP ALLEN IN TO SEE PT AND FAMILY

## 2013-11-22 NOTE — ED Provider Notes (Signed)
CSN: [id number]     Arrival date & time 11/22/13  1115 History   First MD Initiated Contact with Patient 11/22/13 1116     Chief Complaint  Patient presents with  . Fall     (Consider location/radiation/quality/duration/timing/severity/associated sxs/prior Treatment) HPI [name] is a pleasant 80 year old female with past medical history of dementia, recent history of a C2 fracture status post fall, here today after falling from a sitting position. Patient brought in by EMS, reports nursing home staff reported patient "rolled off of a sofa while trying to get up, falling on her left side on the floor." Per nursing home staff, patient did not sustain any loss of consciousness, and was responding appropriately to her normal orientation immediately after the fall. Patient is fully alert, answering questions appropriately to person and place only, and denies having any complaints while in the ER.  Past Medical History  Diagnosis Date  . Unspecified hypothyroidism   . Disorder of bone and cartilage, unspecified   . Chronic airway obstruction, not elsewhere classified   . Unspecified essential hypertension   . Esophageal reflux   . Other and unspecified hyperlipidemia   . Dementia    Past Surgical History  Procedure Laterality Date  . Cataract extractions      bilateral  . Abdominal hysterectomy      total  . Bilateral salpingoopherectomy    . Rectal polyp removal      No malignancy identified   Family History  Problem Relation Age of Onset  . Heart failure Mother   . Cancer Father    History  Substance Use Topics  . Smoking status: Never Smoker   . Smokeless tobacco: Never Used  . Alcohol Use: No   OB History   Grav Para Term Preterm Abortions TAB SAB Ect Mult Living                 Review of Systems  Unable to perform ROS: Dementia      Allergies  Review of patient's allergies indicates no known allergies.  Home Medications   Prior to Admission  medications   Medication Sig Start Date End Date Taking? Authorizing Provider  acetaminophen (TYLENOL) 325 MG tablet Take 325 mg by mouth 2 (two) times daily.   Yes Historical Provider, MD  Cranberry 250 MG CAPS Take 500 mg by mouth 2 (two) times daily.   Yes Historical Provider, MD  diclofenac sodium (VOLTAREN) 1 % GEL Apply 2 g topically 2 (two) times daily. Applied to right shoulder   Yes Historical Provider, MD  docusate (COLACE) 50 MG/5ML liquid Take 200 mg by mouth 2 (two) times daily.   Yes Historical Provider, MD  enalapril (VASOTEC) 2.5 MG tablet Take 2.5 mg by mouth daily.   Yes Historical Provider, MD  Eyelid Cleansers (OCUSOFT LID SCRUB EX) Apply 1 application topically daily. Use to clean both eyes every day   Yes Historical Provider, MD  ferrous sulfate 325 (65 FE) MG tablet Take 325 mg by mouth 2 (two) times daily with a meal.    Yes Historical Provider, MD  levothyroxine (SYNTHROID, LEVOTHROID) 75 MCG tablet Take 75 mcg by mouth daily before breakfast.   Yes Historical Provider, MD  Nutritional Supplements (NUTRITIONAL DRINK PO) Take 237 mLs by mouth 3 (three) times daily. House shakes   Yes Historical Provider, MD  omeprazole (PRILOSEC) 20 MG capsule Take 20 mg by mouth daily.    Yes Historical Provider, MD  OVER THE COUNTER MEDICATION Take 1  tablet by mouth daily. Therapy M 9mg -400mcg   Yes Historical Provider, MD  triamcinolone cream (KENALOG) 0.1 % Apply 1 application topically 2 (two) times daily. Applied to face, arms, hands and legs.   Yes Historical Provider, MD  acetaminophen (TYLENOL) 325 MG tablet Take 650 mg by mouth every 6 (six) hours as needed.    Historical Provider, MD  clotrimazole (LOTRIMIN) 1 % cream Apply 1 application topically 2 (two) times daily as needed (to affected skin).     Historical Provider, MD  HYDROcodone-acetaminophen (NORCO/VICODIN) 5-325 MG per tablet Take 1 tablet by mouth every 6 (six) hours as needed for moderate pain or severe pain. 10/13/13    Richardean Canalavid H Yao, MD  ondansetron (ZOFRAN) 4 MG tablet Take 4 mg by mouth every 6 (six) hours as needed for nausea or vomiting.    Historical Provider, MD   BP 138/56  Pulse 75  Temp(Src) 98 F (36.7 C) (Oral)  Resp 16  SpO2 98% Physical Exam  Nursing note and vitals reviewed. Constitutional: She is oriented to person, place, and time. She appears well-developed and well-nourished. No distress.  HENT:  Head: Normocephalic and atraumatic.    Mouth/Throat: Oropharynx is clear and moist. No oropharyngeal exudate.  Mild abrasion on left upper forehead consistent with a carpet or rug burn.  Eyes: EOM are normal. Pupils are equal, round, and reactive to light. Right eye exhibits no discharge. Left eye exhibits no discharge. No scleral icterus.  Neck: Normal range of motion.  Cardiovascular: Normal rate, regular rhythm, S1 normal, S2 normal and normal heart sounds.   No murmur heard. Pulses:      Radial pulses are 2+ on the right side, and 2+ on the left side.  Pulmonary/Chest: Effort normal and breath sounds normal. No respiratory distress.  Abdominal: Soft. Normal appearance and bowel sounds are normal. There is no tenderness.  Musculoskeletal: Normal range of motion. She exhibits no edema and no tenderness.       Cervical back: She exhibits normal range of motion, no tenderness, no bony tenderness, no swelling, no edema and no deformity.  Neurological: She is alert and oriented to person, place, and time. She has normal strength. No sensory deficit. Coordination normal. GCS eye subscore is 4. GCS verbal subscore is 5. GCS motor subscore is 6.  Patient fully alert, answering questions appropriately to person and place only. Patient unable to recall the specific events leading up to now. Patient extremely hard of hearing, however all other cranial nerves grossly intact. Motor strength 5 out of 5 in all major muscle groups of upper and lower extremities.   Skin: Skin is warm and dry. No rash noted.  She is not diaphoretic.  Psychiatric: She has a normal mood and affect.    ED Course  Procedures (including critical care time) Labs Review Labs Reviewed - No data to display  Imaging Review Ct Head Wo Contrast  11/22/2013   CLINICAL DATA:  Status post fall  EXAM: CT HEAD WITHOUT CONTRAST  CT CERVICAL SPINE WITHOUT CONTRAST  TECHNIQUE: Multidetector CT imaging of the head and cervical spine was performed following the standard protocol without intravenous contrast. Multiplanar CT image reconstructions of the cervical spine were also generated.  COMPARISON:  None.  FINDINGS: CT HEAD FINDINGS  There is no evidence of mass effect, midline shift, or extra-axial fluid collections. There is no evidence of a space-occupying lesion or intracranial hemorrhage. There is no evidence of a cortical-based area of acute infarction. There is generalized  cerebral atrophy. There is periventricular white matter low attenuation likely secondary to microangiopathy.  The ventricles and sulci are appropriate for the patient's age. The basal cisterns are patent.  Visualized portions of the orbits are unremarkable. The visualized portions of the paranasal sinuses and mastoid air cells are unremarkable. Cerebrovascular atherosclerotic calcifications are noted.  The osseous structures are unremarkable.  CT CERVICAL SPINE FINDINGS  The alignment is anatomic. The vertebral body heights are maintained. There is 2 mm of anterolisthesis of C3 on C4 secondary to facet disease. The prevertebral soft tissues are normal. The intraspinal soft tissues are not fully imaged on this examination due to poor soft tissue contrast, but there is no gross soft tissue abnormality.  Ununited, mildly displaced type II dens fracture with 6 mm of distraction between the body and odontoid process.  There is degenerative disc disease at C3-4, C4-5 and C5-6. There are bilateral uncovertebral degenerative changes at C4-5 and C5-6 with mild foraminal  encroachment. There is bilateral facet arthropathy throughout the cervical spine, left greater than right.  There is biapical fibrosis.There is bilateral carotid artery atherosclerosis.  IMPRESSION: 1. No acute intracranial pathology. 2. No acute osseous injury of the cervical spine. 3. Ununited, mildly displaced and distracted Type II C2 dens fracture.   Electronically Signed   By: Elige Ko   On: 11/22/2013 12:44   Ct Cervical Spine Wo Contrast  11/22/2013   CLINICAL DATA:  Status post fall  EXAM: CT HEAD WITHOUT CONTRAST  CT CERVICAL SPINE WITHOUT CONTRAST  TECHNIQUE: Multidetector CT imaging of the head and cervical spine was performed following the standard protocol without intravenous contrast. Multiplanar CT image reconstructions of the cervical spine were also generated.  COMPARISON:  None.  FINDINGS: CT HEAD FINDINGS  There is no evidence of mass effect, midline shift, or extra-axial fluid collections. There is no evidence of a space-occupying lesion or intracranial hemorrhage. There is no evidence of a cortical-based area of acute infarction. There is generalized cerebral atrophy. There is periventricular white matter low attenuation likely secondary to microangiopathy.  The ventricles and sulci are appropriate for the patient's age. The basal cisterns are patent.  Visualized portions of the orbits are unremarkable. The visualized portions of the paranasal sinuses and mastoid air cells are unremarkable. Cerebrovascular atherosclerotic calcifications are noted.  The osseous structures are unremarkable.  CT CERVICAL SPINE FINDINGS  The alignment is anatomic. The vertebral body heights are maintained. There is 2 mm of anterolisthesis of C3 on C4 secondary to facet disease. The prevertebral soft tissues are normal. The intraspinal soft tissues are not fully imaged on this examination due to poor soft tissue contrast, but there is no gross soft tissue abnormality.  Ununited, mildly displaced type II dens  fracture with 6 mm of distraction between the body and odontoid process.  There is degenerative disc disease at C3-4, C4-5 and C5-6. There are bilateral uncovertebral degenerative changes at C4-5 and C5-6 with mild foraminal encroachment. There is bilateral facet arthropathy throughout the cervical spine, left greater than right.  There is biapical fibrosis.There is bilateral carotid artery atherosclerosis.  IMPRESSION: 1. No acute intracranial pathology. 2. No acute osseous injury of the cervical spine. 3. Ununited, mildly displaced and distracted Type II C2 dens fracture.   Electronically Signed   By: Elige Ko   On: 11/22/2013 12:44     EKG Interpretation None      MDM   Final diagnoses:  Fall  Dementia   With patient's baseline dementia, previous  C2 fracture and reported witnessed fall today, we will perform CT head/cervical spine for rule out of new injury. Further lab workup deferred due to the fact the patient's fall was witnessed, and there was no reported loss of consciousness. Patient's daughter is in the room and I discussed this treatment plan with her, and she was agreeable that patient is at baseline for her mental status, and she is agreeable to our plan. The only injury unable to appreciate on physical exam is a very mild abrasions patient's left upper forehead, consistent with rug burn. Patient continuously denies having any pain or complaints.  12:44 PM: CT findings with impression: 1. No acute intracranial pathology. 2. No acute osseous injury of the cervical spine. 3. Ununited, mildly displaced and distracted Type II C2 dens fracture.  CT findings consistent with previous CT, and appear baseline for patient. I discussed these findings with patient's daughter, and she stated the patient has close follow-up regarding this fracture which she has been told is nonoperative, as long as patient remains asymptomatic. With multiple exams the patient's neck, patient has normal  range of motion of her neck with out discomfort, and I am not able to appreciate any tenderness on exam. We will discharge patient this time, and have her follow-up with her primary care physician. I discussed return precautions with patient's daughter in the room, and they were agreeable to this plan. I encouraged patient and her daughter to have patient return should they have any questions or concerns.  BP 138/56  Pulse 75  Temp(Src) 98 F (36.7 C) (Oral)  Resp 16  SpO2 98%  Signed,  Deanna MowJoe Idalee Foxworthy, PA-C 4:53 PM  This patient seen and discussed with Dr. Lorre NickAnthony Allen, M.D.  Monte FantasiaJoseph W Kimra Kantor, PA-C 11/22/13 [phone]

## 2013-11-22 NOTE — ED Notes (Addendum)
[facility] EMS[facility] hx of dementia. Pt fell off couch this am and landed on L side. No LOC or blood thinner use, pt has hx of old neck injury. Pt alert per norm.

## 2013-11-22 NOTE — Discharge Instructions (Signed)
Fall Prevention and Home Safety  Falls cause injuries and can affect all age groups. It is possible to use preventive measures to significantly decrease the likelihood of falls. There are many simple measures which can make your home safer and prevent falls.  OUTDOORS   Repair cracks and edges of walkways and driveways.   Remove high doorway thresholds.   Trim shrubbery on the main path into your home.   Have good outside lighting.   Clear walkways of tools, rocks, debris, and clutter.   Check that handrails are not broken and are securely fastened. Both sides of steps should have handrails.   Have leaves, snow, and ice cleared regularly.   Use sand or salt on walkways during winter months.   In the garage, clean up grease or oil spills.  BATHROOM   Install night lights.   Install grab bars by the toilet and in the tub and shower.   Use non-skid mats or decals in the tub or shower.   Place a plastic non-slip stool in the shower to sit on, if needed.   Keep floors dry and clean up all water on the floor immediately.   Remove soap buildup in the tub or shower on a regular basis.   Secure bath mats with non-slip, double-sided rug tape.   Remove throw rugs and tripping hazards from the floors.  BEDROOMS   Install night lights.   Make sure a bedside light is easy to reach.   Do not use oversized bedding.   Keep a telephone by your bedside.   Have a firm chair with side arms to use for getting dressed.   Remove throw rugs and tripping hazards from the floor.  KITCHEN   Keep handles on pots and pans turned toward the center of the stove. Use back burners when possible.   Clean up spills quickly and allow time for drying.   Avoid walking on wet floors.   Avoid hot utensils and knives.   Position shelves so they are not too high or low.   Place commonly used objects within easy reach.   If necessary, use a sturdy step stool with a grab bar when reaching.   Keep electrical cables out of the  way.   Do not use floor polish or wax that makes floors slippery. If you must use wax, use non-skid floor wax.   Remove throw rugs and tripping hazards from the floor.  STAIRWAYS   Never leave objects on stairs.   Place handrails on both sides of stairways and use them. Fix any loose handrails. Make sure handrails on both sides of the stairways are as long as the stairs.   Check carpeting to make sure it is firmly attached along stairs. Make repairs to worn or loose carpet promptly.   Avoid placing throw rugs at the top or bottom of stairways, or properly secure the rug with carpet tape to prevent slippage. Get rid of throw rugs, if possible.   Have an electrician put in a light switch at the top and bottom of the stairs.  OTHER FALL PREVENTION TIPS   Wear low-heel or rubber-soled shoes that are supportive and fit well. Wear closed toe shoes.   When using a stepladder, make sure it is fully opened and both spreaders are firmly locked. Do not climb a closed stepladder.   Add color or contrast paint or tape to grab bars and handrails in your home. Place contrasting color strips on first and last   steps.   Learn and use mobility aids as needed. Install an electrical emergency response system.   Turn on lights to avoid dark areas. Replace light bulbs that burn out immediately. Get light switches that glow.   Arrange furniture to create clear pathways. Keep furniture in the same place.   Firmly attach carpet with non-skid or double-sided tape.   Eliminate uneven floor surfaces.   Select a carpet pattern that does not visually hide the edge of steps.   Be aware of all pets.  OTHER HOME SAFETY TIPS   Set the water temperature for 120 F (48.8 C).   Keep emergency numbers on or near the telephone.   Keep smoke detectors on every level of the home and near sleeping areas.  Document Released: 01/04/2002 Document Revised: 07/16/2011 Document Reviewed: 04/05/2011  Exit[employer] Patient Information 2015  [employer]. This information is not intended to replace advice given to you by your health care provider. Make sure you discuss any questions you have with your health care provider.      Dementia  Dementia is a general term for problems with brain function. A person with dementia has memory loss and a hard time with at least one other brain function such as thinking, speaking, or problem solving. Dementia can affect social functioning, how you do your job, your mood, or your personality. The changes may be hidden for a long time. The earliest forms of this disease are usually not detected by family or friends.  Dementia can be:   Irreversible.   Potentially reversible.   Partially reversible.   Progressive. This means it can get worse over time.  CAUSES   Irreversible dementia causes may include:   Degeneration of brain cells (Alzheimer disease or Lewy body dementia).   Multiple small strokes (vascular dementia).   Infection (chronic meningitis or Creutzfeldt-Jakob disease).   Frontotemporal dementia. This affects younger people, age 40 to 70, compared to those who have Alzheimer disease.   Dementia associated with other disorders like Parkinson disease, Huntington disease, or HIV-associated dementia.  Potentially or partially reversible dementia causes may include:   Medicines.   Metabolic causes such as excessive alcohol intake, vitamin B12 deficiency, or thyroid disease.   Masses or pressure in the brain such as a tumor, blood clot, or hydrocephalus.  SIGNS AND SYMPTOMS   Symptoms are often hard to detect. Family members or coworkers may not notice them early in the disease process. Different people with dementia may have different symptoms. Symptoms can include:   A hard time with memory, especially recent memory. Long-term memory may not be impaired.   Asking the same question multiple times or forgetting something someone just said.   A hard time speaking your thoughts or finding certain  words.   A hard time solving problems or performing familiar tasks (such as how to use a telephone).   Sudden changes in mood.   Changes in personality, especially increasing moodiness or mistrust.   Depression.   A hard time understanding complex ideas that were never a problem in the past.  DIAGNOSIS   There are no specific tests for dementia.    Your health care provider may recommend a thorough evaluation. This is because some forms of dementia can be reversible. The evaluation will likely include a physical exam and getting a detailed history from you and a family member. The history often gives the best clues and suggestions for a diagnosis.   Memory testing may be done. A   detailed brain function evaluation called neuropsychologic testing may be helpful.   Lab tests and brain imaging (such as a CT scan or MRI scan) are sometimes important.   Sometimes observation and re-evaluation over time is very helpful.  TREATMENT   Treatment depends on the cause.    If the problem is a vitamin deficiency, it may be helped or cured with supplements.   For dementias such as Alzheimer disease, medicines are available to stabilize or slow the course of the disease. There are no cures for this type of dementia.   Your health care provider can help direct you to groups, organizations, and other health care providers to help with decisions in the care of you or your loved one.  HOME CARE INSTRUCTIONS  The care of individuals with dementia is varied and dependent upon the progression of the dementia. The following suggestions are intended for the person living with, or caring for, the person with dementia.   Create a safe environment.   Remove the locks on bathroom doors to prevent the person from accidentally locking himself or herself in.   Use childproof latches on kitchen cabinets and any place where cleaning supplies, chemicals, or alcohol are kept.   Use childproof covers in unused electrical  outlets.   Install childproof devices to keep doors and windows secured.   Remove stove knobs or install safety knobs and an automatic shut-off on the stove.   Lower the temperature on water heaters.   Label medicines and keep them locked up.   Secure knives, lighters, matches, power tools, and guns, and keep these items out of reach.   Keep the house free from clutter. Remove rugs or anything that might contribute to a fall.   Remove objects that might break and hurt the person.   Make sure lighting is good, both inside and outside.   Install grab rails as needed.   Use a monitoring device to alert you to falls or other needs for help.   Reduce confusion.   Keep familiar objects and people around.   Use night lights or dim lights at night.   Label items or areas.   Use reminders, notes, or directions for daily activities or tasks.   Keep a simple, consistent routine for waking, meals, bathing, dressing, and bedtime.   Create a calm, quiet environment.   Place large clocks and calendars prominently.   Display emergency numbers and home address near all telephones.   Use cues to establish different times of the day. An example is to open curtains to let the natural light in during the day.    Use effective communication.   Choose simple words and short sentences.   Use a gentle, calm tone of voice.   Be careful not to interrupt.   If the person is struggling to find a word or communicate a thought, try to provide the word or thought.   Ask one question at a time. Allow the person ample time to answer questions. Repeat the question again if the person does not respond.   Reduce nighttime restlessness.   Provide a comfortable bed.   Have a consistent nighttime routine.   Ensure a regular walking or physical activity schedule. Involve the person in daily activities as much as possible.   Limit napping during the day.   Limit caffeine.   Attend social events that stimulate rather than  overwhelm the senses.   Encourage good nutrition and hydration.   Reduce distractions during   meal times and snacks.   Avoid foods that are too hot or too cold.   Monitor chewing and swallowing ability.   Continue with routine vision, hearing, dental, and medical screenings.   Give medicines only as directed by the health care provider.   Monitor driving abilities. Do not allow the person to drive when safe driving is no longer possible.   Register with an identification program which could provide location assistance in the event of a missing person situation.  SEEK MEDICAL CARE IF:    New behavioral problems start such as moodiness, aggressiveness, or seeing things that are not there (hallucinations).   Any new problem with brain function happens. This includes problems with balance, speech, or falling a lot.   Problems with swallowing develop.   Any symptoms of other illness happen.  Small changes or worsening in any aspect of brain function can be a sign that the illness is getting worse. It can also be a sign of another medical illness such as infection. Seeing a health care provider right away is important.  SEEK IMMEDIATE MEDICAL CARE IF:    A fever develops.   New or worsened confusion develops.   New or worsened sleepiness develops.   Staying awake becomes hard to do.  Document Released: 07/10/2000 Document Revised: 05/31/2013 Document Reviewed: 06/11/2010  [employer] Patient Information 2015 [employer]. This information is not intended to replace advice given to you by your health care provider. Make sure you discuss any questions you have with your health care provider.

## 2013-11-22 NOTE — ED Notes (Signed)
Bed: WA06 Expected date:  Expected time:  Means of arrival:  Comments: EMS- elderly, Pt slid off sofa at facility

## 2013-11-24 NOTE — ED Provider Notes (Signed)
I saw and evaluated the patient, reviewed the resident's note and I agree with the findings and plan.   EKG Interpretation None       Toy BakerAnthony T Evy Lutterman, MD 11/24/13 1020

## 2013-12-05 IMAGING — CT CT HEAD W/O CM
2 series · 17 of 30 positions shown, 20 images · non-contrast
Comparison: 12/09/2010.

CLINICAL DATA: Passed out.  Loss of consciousness.

CT HEAD WITHOUT CONTRAST
TECHNIQUE: Contiguous axial images were obtained from the base of
the skull through the vertex without contrast.

[Series 2: head w/o · axial · non-contrast · 0.43mm/px · z∈[-111,-1]mm · 9 of 28 slices shown, 12 images]
[im 3/28  brain]
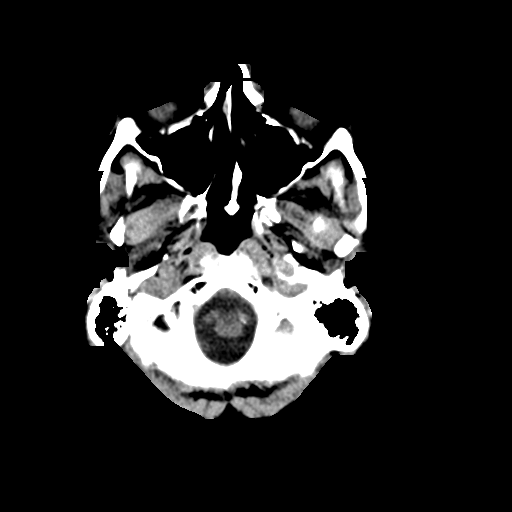
[im 3/28  bone]
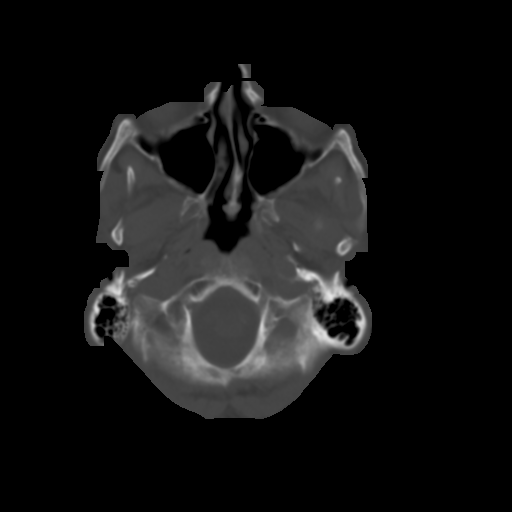
[im 6/28  brain]
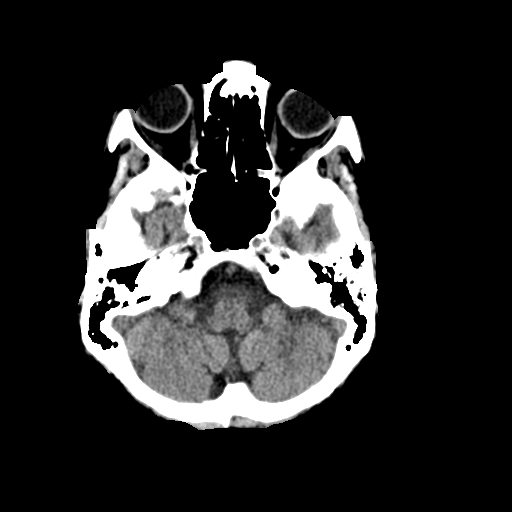
[im 9/28  brain]
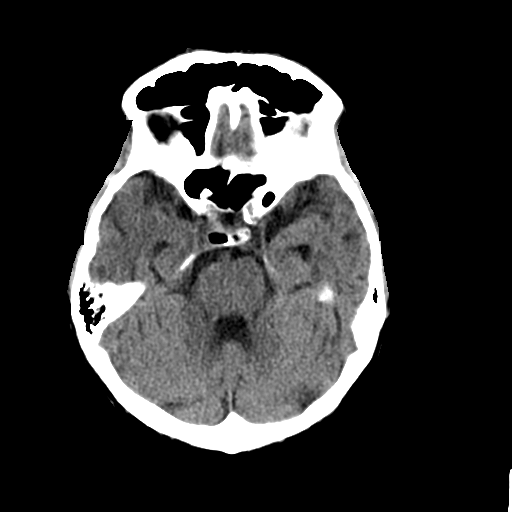
[im 11/28  brain]
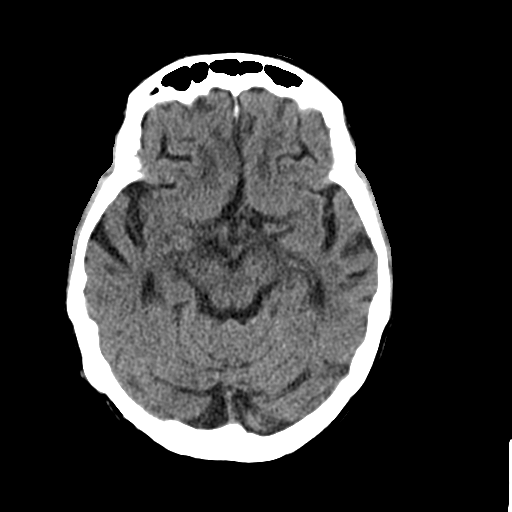
[im 14/28  brain]
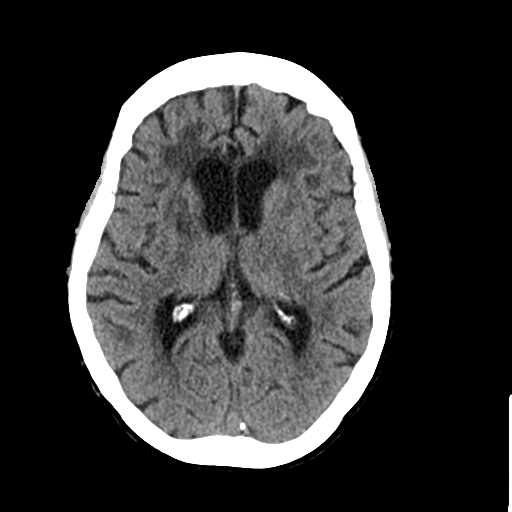
[im 14/28  bone]
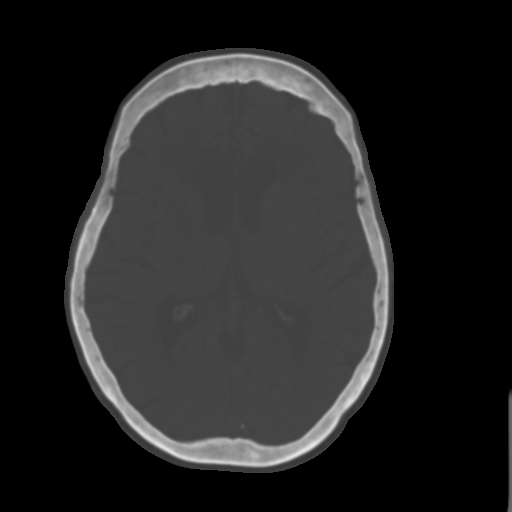
[im 17/28  brain]
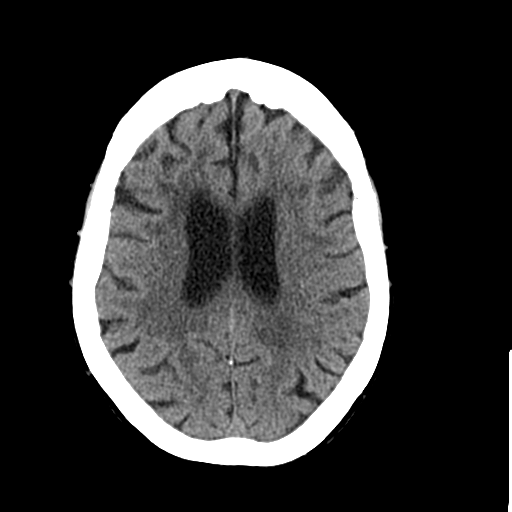
[im 19/28  brain]
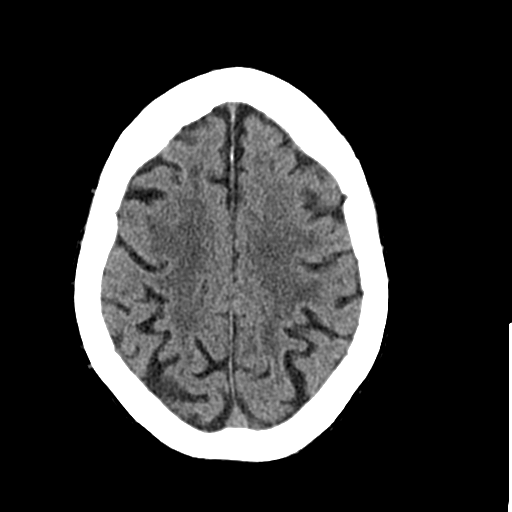
[im 22/28  brain]
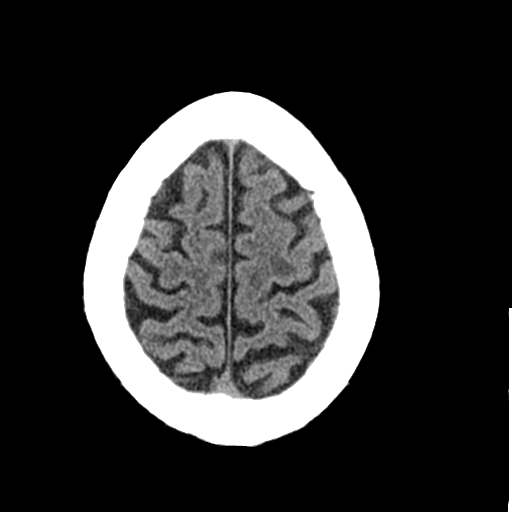
[im 25/28  brain]
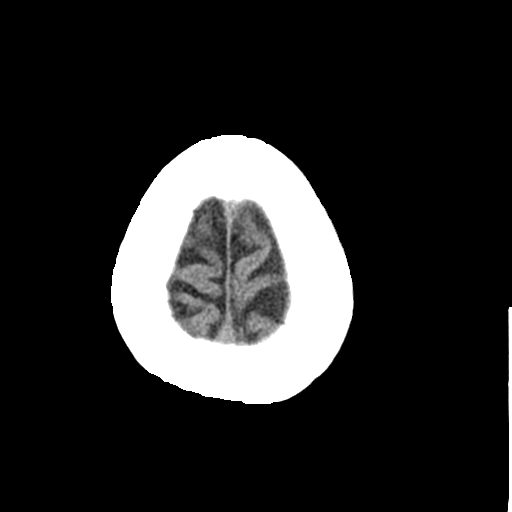
[im 25/28  bone]
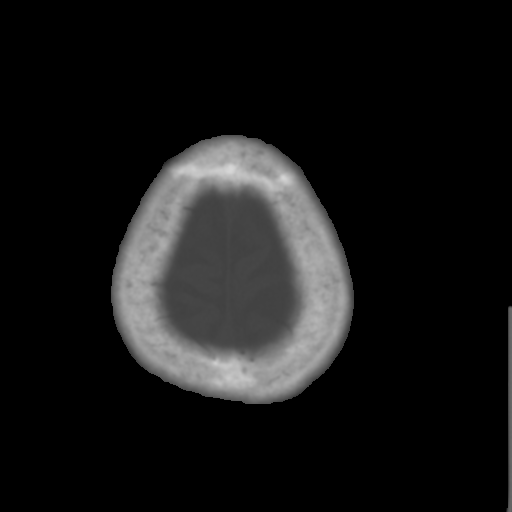

[Series 3: bone windows · axial · 0.43mm/px · z∈[-106,-1]mm · 8 of 46 slices shown]
[im 6/46  bone]
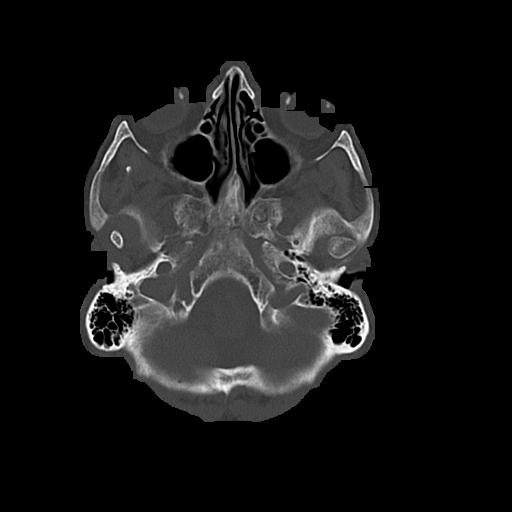
[im 11/46  bone]
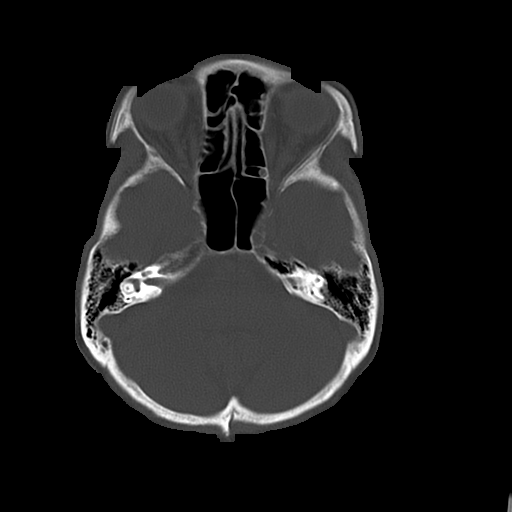
[im 16/46  bone]
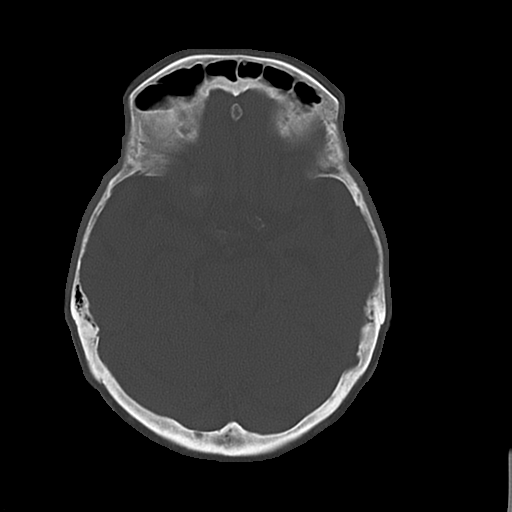
[im 21/46  bone]
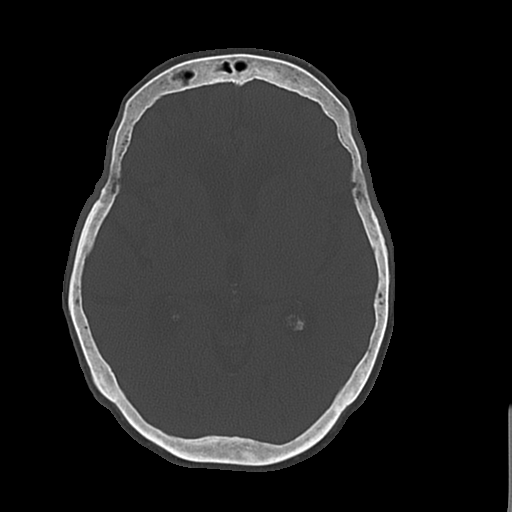
[im 26/46  bone]
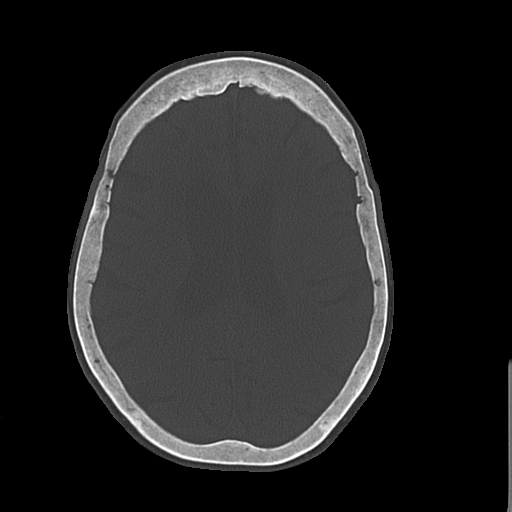
[im 31/46  bone]
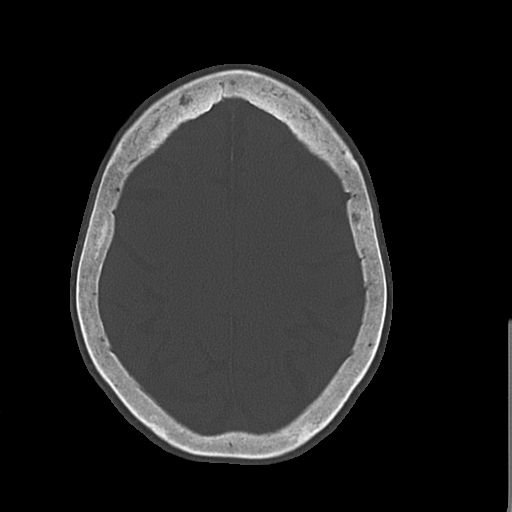
[im 36/46  bone]
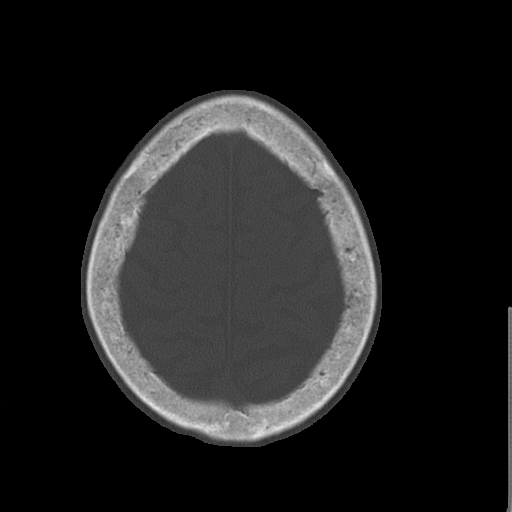
[im 41/46  bone]
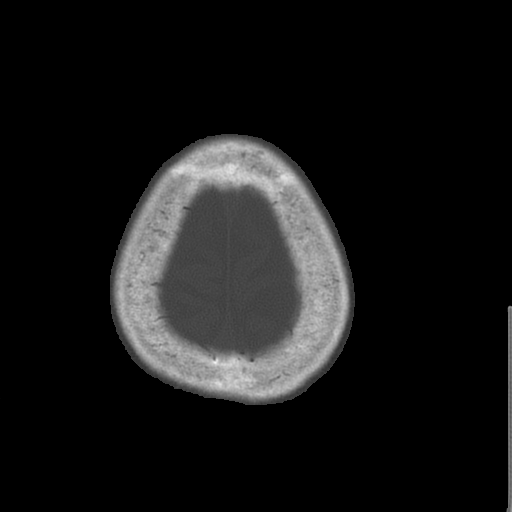

[17 of 30 positions shown; findings below may reference images not displayed]

FINDINGS: No skull fracture or intracranial hemorrhage.

Global atrophy.  Ventricular prominence felt to be related to
atrophy without hydrocephalus.

Small vessel disease type changes.  No CT evidence of large acute
infarct. Remote right lenticular nucleus infarct.

No intracranial mass lesion detected on this unenhanced exam..

Vascular calcifications.
IMPRESSION: No skull fracture or intracranial hemorrhage.

No CT evidence of large acute infarct.

Small vessel disease type changes.

Remote right lenticular nucleus infarct.

Global atrophy without hydrocephalus.

Vascular calcifications.

## 2014-02-07 ENCOUNTER — Encounter (HOSPITAL_COMMUNITY): Payer: Self-pay | Admitting: Emergency Medicine

## 2014-02-07 ENCOUNTER — Inpatient Hospital Stay (HOSPITAL_COMMUNITY)
Admission: EM | Admit: 2014-02-07 | Discharge: 2014-02-18 | DRG: 871 | Disposition: A | Discharge status: Skilled Nursing Facility | Payer: Medicare Other | Attending: Internal Medicine | Admitting: Internal Medicine

## 2014-02-07 DIAGNOSIS — I5032 Chronic diastolic (congestive) heart failure: Secondary | ICD-10-CM | POA: Diagnosis present

## 2014-02-07 DIAGNOSIS — IMO0001 Reserved for inherently not codable concepts without codable children: Secondary | ICD-10-CM | POA: Insufficient documentation

## 2014-02-07 DIAGNOSIS — E876 Hypokalemia: Secondary | ICD-10-CM | POA: Diagnosis present

## 2014-02-07 DIAGNOSIS — F0281 Dementia in other diseases classified elsewhere with behavioral disturbance: Secondary | ICD-10-CM | POA: Diagnosis present

## 2014-02-07 DIAGNOSIS — Z66 Do not resuscitate: Secondary | ICD-10-CM | POA: Diagnosis present

## 2014-02-07 DIAGNOSIS — R55 Syncope and collapse: Secondary | ICD-10-CM | POA: Diagnosis not present

## 2014-02-07 DIAGNOSIS — A419 Sepsis, unspecified organism: Secondary | ICD-10-CM | POA: Diagnosis present

## 2014-02-07 DIAGNOSIS — J449 Chronic obstructive pulmonary disease, unspecified: Secondary | ICD-10-CM | POA: Diagnosis present

## 2014-02-07 DIAGNOSIS — I509 Heart failure, unspecified: Secondary | ICD-10-CM

## 2014-02-07 DIAGNOSIS — R52 Pain, unspecified: Secondary | ICD-10-CM

## 2014-02-07 DIAGNOSIS — E860 Dehydration: Secondary | ICD-10-CM | POA: Diagnosis present

## 2014-02-07 DIAGNOSIS — N390 Urinary tract infection, site not specified: Secondary | ICD-10-CM | POA: Diagnosis present

## 2014-02-07 DIAGNOSIS — Z6822 Body mass index (BMI) 22.0-22.9, adult: Secondary | ICD-10-CM

## 2014-02-07 DIAGNOSIS — N3000 Acute cystitis without hematuria: Secondary | ICD-10-CM | POA: Insufficient documentation

## 2014-02-07 DIAGNOSIS — K219 Gastro-esophageal reflux disease without esophagitis: Secondary | ICD-10-CM | POA: Diagnosis present

## 2014-02-07 DIAGNOSIS — A0472 Enterocolitis due to Clostridium difficile, not specified as recurrent: Secondary | ICD-10-CM | POA: Diagnosis present

## 2014-02-07 DIAGNOSIS — Z79899 Other long term (current) drug therapy: Secondary | ICD-10-CM

## 2014-02-07 DIAGNOSIS — E039 Hypothyroidism, unspecified: Secondary | ICD-10-CM | POA: Diagnosis present

## 2014-02-07 DIAGNOSIS — Z9842 Cataract extraction status, left eye: Secondary | ICD-10-CM

## 2014-02-07 DIAGNOSIS — T501X5A Adverse effect of loop [high-ceiling] diuretics, initial encounter: Secondary | ICD-10-CM | POA: Diagnosis present

## 2014-02-07 DIAGNOSIS — A4153 Sepsis due to Serratia: Secondary | ICD-10-CM | POA: Diagnosis not present

## 2014-02-07 DIAGNOSIS — Z90710 Acquired absence of both cervix and uterus: Secondary | ICD-10-CM

## 2014-02-07 DIAGNOSIS — F02818 Dementia in other diseases classified elsewhere, unspecified severity, with other behavioral disturbance: Secondary | ICD-10-CM | POA: Diagnosis present

## 2014-02-07 DIAGNOSIS — I5021 Acute systolic (congestive) heart failure: Secondary | ICD-10-CM

## 2014-02-07 DIAGNOSIS — G309 Alzheimer's disease, unspecified: Secondary | ICD-10-CM | POA: Diagnosis present

## 2014-02-07 DIAGNOSIS — Z9841 Cataract extraction status, right eye: Secondary | ICD-10-CM

## 2014-02-07 DIAGNOSIS — R1310 Dysphagia, unspecified: Secondary | ICD-10-CM | POA: Diagnosis present

## 2014-02-07 DIAGNOSIS — I10 Essential (primary) hypertension: Secondary | ICD-10-CM | POA: Diagnosis present

## 2014-02-07 DIAGNOSIS — A047 Enterocolitis due to Clostridium difficile: Secondary | ICD-10-CM | POA: Diagnosis present

## 2014-02-07 DIAGNOSIS — E43 Unspecified severe protein-calorie malnutrition: Secondary | ICD-10-CM | POA: Diagnosis present

## 2014-02-07 DIAGNOSIS — Z79891 Long term (current) use of opiate analgesic: Secondary | ICD-10-CM

## 2014-02-07 DIAGNOSIS — B9689 Other specified bacterial agents as the cause of diseases classified elsewhere: Secondary | ICD-10-CM | POA: Diagnosis present

## 2014-02-07 NOTE — ED Notes (Signed)
Patient transported to X-ray 

## 2014-02-07 NOTE — ED Notes (Signed)
Per EMS, facility stated pt was passing stool on the toilet and passed out. Hx of same. Pt not responsive for approx 5 min. Pt is awake and answering questions at this time.

## 2014-02-07 NOTE — ED Notes (Signed)
I spoke to the patient's nurse and she advised me that she sent the current medication list with the patient.  She also said she does not get any narcotics.

## 2014-02-07 NOTE — ED Provider Notes (Signed)
CSN: [id number]     Arrival date & time 02/07/14  2342 History  This chart was scribed for [name] Smitty Cords, MD by Evon Slack, ED Scribe. This patient was seen in room A01C/A01C and the patient's care was started at 11:45 PM.      Chief Complaint  Patient presents with  . Loss of Consciousness   Patient is a 81 y.o. female presenting with syncope. The history is provided by the EMS personnel. No language interpreter was used.  Loss of Consciousness Episode history:  Single Most recent episode:  Today Timing:  Constant Progression:  Resolved Chronicity:  New Context: bowel movement   Witnessed: yes   Relieved by:  Nothing Worsened by:  Nothing tried Ineffective treatments:  None tried Risk factors: no seizures    Level 5 Caveat: Dementia   HPI Comments: [name] is a 81 y.o. female with PMHx of dementia brought in by ambulance, who presents to the Emergency Department complaining of LOC. EMS states that pt was sitting on the commode and had a syncopal episode. Ems states that patient was producing stool at the time of syncope.    Past Medical History  Diagnosis Date  . Unspecified hypothyroidism   . Disorder of bone and cartilage, unspecified   . Chronic airway obstruction, not elsewhere classified   . Unspecified essential hypertension   . Esophageal reflux   . Other and unspecified hyperlipidemia   . Dementia    Past Surgical History  Procedure Laterality Date  . Cataract extractions      bilateral  . Abdominal hysterectomy      total  . Bilateral salpingoopherectomy    . Rectal polyp removal      No malignancy identified   Family History  Problem Relation Age of Onset  . Heart failure Mother   . Cancer Father    History  Substance Use Topics  . Smoking status: Never Smoker   . Smokeless tobacco: Never Used  . Alcohol Use: No   OB History    No data available     Review of Systems  Unable to perform ROS: Dementia  Cardiovascular:  Positive for syncope.     Allergies  Review of patient's allergies indicates no known allergies.  Home Medications   Prior to Admission medications   Medication Sig Start Date End Date Taking? Authorizing Provider  acetaminophen (TYLENOL) 325 MG tablet Take 325 mg by mouth 2 (two) times daily.    Historical Provider, MD  acetaminophen (TYLENOL) 325 MG tablet Take 650 mg by mouth every 6 (six) hours as needed.    Historical Provider, MD  clotrimazole (LOTRIMIN) 1 % cream Apply 1 application topically 2 (two) times daily as needed (to affected skin).     Historical Provider, MD  Cranberry 250 MG CAPS Take 500 mg by mouth 2 (two) times daily.    Historical Provider, MD  diclofenac sodium (VOLTAREN) 1 % GEL Apply 2 g topically 2 (two) times daily. Applied to right shoulder    Historical Provider, MD  docusate (COLACE) 50 MG/5ML liquid Take 200 mg by mouth 2 (two) times daily.    Historical Provider, MD  enalapril (VASOTEC) 2.5 MG tablet Take 2.5 mg by mouth daily.    Historical Provider, MD  Eyelid Cleansers (OCUSOFT LID SCRUB EX) Apply 1 application topically daily. Use to clean both eyes every day    Historical Provider, MD  ferrous sulfate 325 (65 FE) MG tablet Take 325 mg by mouth 2 (  two) times daily with a meal.     Historical Provider, MD  HYDROcodone-acetaminophen (NORCO/VICODIN) 5-325 MG per tablet Take 1 tablet by mouth every 6 (six) hours as needed for moderate pain or severe pain. 10/13/13   Richardean Canalavid H Yao, MD  levothyroxine (SYNTHROID, LEVOTHROID) 75 MCG tablet Take 75 mcg by mouth daily before breakfast.    Historical Provider, MD  Nutritional Supplements (NUTRITIONAL DRINK PO) Take 237 mLs by mouth 3 (three) times daily. House shakes    Historical Provider, MD  omeprazole (PRILOSEC) 20 MG capsule Take 20 mg by mouth daily.     Historical Provider, MD  ondansetron (ZOFRAN) 4 MG tablet Take 4 mg by mouth every 6 (six) hours as needed for nausea or vomiting.    Historical Provider, MD   OVER THE COUNTER MEDICATION Take 1 tablet by mouth daily. Therapy M 9mg -400mcg    Historical Provider, MD  triamcinolone cream (KENALOG) 0.1 % Apply 1 application topically 2 (two) times daily. Applied to face, arms, hands and legs.    Historical Provider, MD   Triage Vitals: BP 157/62 mmHg  Pulse 74  Resp 21  SpO2 97%   Physical Exam  Constitutional: She appears well-developed and well-nourished. No distress.  HENT:  Head: Normocephalic and atraumatic.  Mouth/Throat: Oropharynx is clear and moist. No oropharyngeal exudate.  Trachea midline   Eyes: Conjunctivae and EOM are normal.  Neck: Normal range of motion. Neck supple. No tracheal deviation present.  Cardiovascular: Normal rate and regular rhythm.   Intact radial pulse.  Pulmonary/Chest: Effort normal and breath sounds normal. No respiratory distress.  Abdominal: Soft. Bowel sounds are normal. She exhibits no mass. There is no tenderness. There is no rebound and no guarding.  Musculoskeletal: Normal range of motion. She exhibits no edema.  Neurological: She is alert. She has normal reflexes.  Skin: Skin is warm and dry. No lesion noted.  Psychiatric: She has a normal mood and affect. Her behavior is normal.  Nursing note and vitals reviewed.   ED Course  Procedures (including critical care time) DIAGNOSTIC STUDIES: Oxygen Saturation is 97% on RA, normal by my interpretation.    COORDINATION OF CARE: 1:45 AM- Cased discussed with Dr.Niu and will admit to inpatient tele.   Labs Review Labs Reviewed  CBC WITH DIFFERENTIAL  URINALYSIS, ROUTINE W REFLEX MICROSCOPIC  I-STAT CHEM 8, ED  I-STAT TROPOININ, ED    Imaging Review No results found.   EKG Interpretation   Date/Time:  Monday February 07 2014 23:51:37 EST Ventricular Rate:  75 PR Interval:  44 QRS Duration: 91 QT Interval:  393 QTC Calculation: 439 R Axis:   -9 Text Interpretation:  Normal sinus rhythm Confirmed by [facility]  MD,  Morene AntuAPRIL ([id number]) on  02/08/2014 12:01:34 AM      MDM   Final diagnoses:  Pain     Per Dr. Clyde LundborgNiu inaptient tele I personally performed the services described in this documentation, which was scribed in my presence. The recorded information has been reviewed and is accurate.       Jasmine AweApril K Verdean Murin-Rasch, MD 02/08/14 [phone]

## 2014-02-08 ENCOUNTER — Emergency Department (HOSPITAL_COMMUNITY): Payer: Medicare Other

## 2014-02-08 ENCOUNTER — Encounter (HOSPITAL_COMMUNITY): Payer: Self-pay | Admitting: Emergency Medicine

## 2014-02-08 DIAGNOSIS — E876 Hypokalemia: Secondary | ICD-10-CM | POA: Diagnosis present

## 2014-02-08 DIAGNOSIS — I5032 Chronic diastolic (congestive) heart failure: Secondary | ICD-10-CM | POA: Diagnosis present

## 2014-02-08 DIAGNOSIS — Z9841 Cataract extraction status, right eye: Secondary | ICD-10-CM | POA: Diagnosis not present

## 2014-02-08 DIAGNOSIS — E860 Dehydration: Secondary | ICD-10-CM | POA: Diagnosis present

## 2014-02-08 DIAGNOSIS — E43 Unspecified severe protein-calorie malnutrition: Secondary | ICD-10-CM | POA: Diagnosis present

## 2014-02-08 DIAGNOSIS — R55 Syncope and collapse: Secondary | ICD-10-CM | POA: Diagnosis present

## 2014-02-08 DIAGNOSIS — B9689 Other specified bacterial agents as the cause of diseases classified elsewhere: Secondary | ICD-10-CM | POA: Diagnosis present

## 2014-02-08 DIAGNOSIS — Z6822 Body mass index (BMI) 22.0-22.9, adult: Secondary | ICD-10-CM | POA: Diagnosis not present

## 2014-02-08 DIAGNOSIS — G308 Other Alzheimer's disease: Secondary | ICD-10-CM

## 2014-02-08 DIAGNOSIS — A419 Sepsis, unspecified organism: Secondary | ICD-10-CM

## 2014-02-08 DIAGNOSIS — I10 Essential (primary) hypertension: Secondary | ICD-10-CM | POA: Diagnosis not present

## 2014-02-08 DIAGNOSIS — Z79899 Other long term (current) drug therapy: Secondary | ICD-10-CM | POA: Diagnosis not present

## 2014-02-08 DIAGNOSIS — A047 Enterocolitis due to Clostridium difficile: Secondary | ICD-10-CM | POA: Diagnosis present

## 2014-02-08 DIAGNOSIS — N3000 Acute cystitis without hematuria: Secondary | ICD-10-CM

## 2014-02-08 DIAGNOSIS — K210 Gastro-esophageal reflux disease with esophagitis: Secondary | ICD-10-CM

## 2014-02-08 DIAGNOSIS — IMO0001 Reserved for inherently not codable concepts without codable children: Secondary | ICD-10-CM | POA: Insufficient documentation

## 2014-02-08 DIAGNOSIS — T501X5A Adverse effect of loop [high-ceiling] diuretics, initial encounter: Secondary | ICD-10-CM | POA: Diagnosis present

## 2014-02-08 DIAGNOSIS — Z90710 Acquired absence of both cervix and uterus: Secondary | ICD-10-CM | POA: Diagnosis not present

## 2014-02-08 DIAGNOSIS — Z79891 Long term (current) use of opiate analgesic: Secondary | ICD-10-CM | POA: Diagnosis not present

## 2014-02-08 DIAGNOSIS — G309 Alzheimer's disease, unspecified: Secondary | ICD-10-CM | POA: Diagnosis present

## 2014-02-08 DIAGNOSIS — R1310 Dysphagia, unspecified: Secondary | ICD-10-CM | POA: Diagnosis present

## 2014-02-08 DIAGNOSIS — F0281 Dementia in other diseases classified elsewhere with behavioral disturbance: Secondary | ICD-10-CM | POA: Diagnosis present

## 2014-02-08 DIAGNOSIS — A4153 Sepsis due to Serratia: Secondary | ICD-10-CM | POA: Diagnosis present

## 2014-02-08 DIAGNOSIS — J449 Chronic obstructive pulmonary disease, unspecified: Secondary | ICD-10-CM | POA: Diagnosis present

## 2014-02-08 DIAGNOSIS — N390 Urinary tract infection, site not specified: Secondary | ICD-10-CM | POA: Diagnosis present

## 2014-02-08 DIAGNOSIS — K219 Gastro-esophageal reflux disease without esophagitis: Secondary | ICD-10-CM | POA: Diagnosis present

## 2014-02-08 DIAGNOSIS — E039 Hypothyroidism, unspecified: Secondary | ICD-10-CM | POA: Diagnosis present

## 2014-02-08 DIAGNOSIS — Z9842 Cataract extraction status, left eye: Secondary | ICD-10-CM | POA: Diagnosis not present

## 2014-02-08 DIAGNOSIS — I509 Heart failure, unspecified: Secondary | ICD-10-CM | POA: Insufficient documentation

## 2014-02-08 DIAGNOSIS — Z66 Do not resuscitate: Secondary | ICD-10-CM | POA: Diagnosis present

## 2014-02-08 LAB — I-STAT CHEM 8, ED
BUN: 30 mg/dL — ABNORMAL HIGH (ref 6–23)
Calcium, Ion: 1.19 mmol/L (ref 1.13–1.30)
Chloride: 105 mEq/L (ref 96–112)
Creatinine, Ser: 1.1 mg/dL (ref 0.50–1.10)
Glucose, Bld: 136 mg/dL — ABNORMAL HIGH (ref 70–99)
HEMATOCRIT: 36 % (ref 36.0–46.0)
HEMOGLOBIN: 12.2 g/dL (ref 12.0–15.0)
Potassium: 4 mmol/L (ref 3.5–5.1)
Sodium: 139 mmol/L (ref 135–145)
TCO2: 21 mmol/L (ref 0–100)

## 2014-02-08 LAB — CBC WITH DIFFERENTIAL/PLATELET
Basophils Absolute: 0 10*3/uL (ref 0.0–0.1)
Basophils Relative: 0 % (ref 0–1)
Eosinophils Absolute: 0 10*3/uL (ref 0.0–0.7)
Eosinophils Relative: 1 % (ref 0–5)
HCT: 33.7 % — ABNORMAL LOW (ref 36.0–46.0)
HEMOGLOBIN: 10.7 g/dL — AB (ref 12.0–15.0)
Lymphocytes Relative: 16 % (ref 12–46)
Lymphs Abs: 1.3 10*3/uL (ref 0.7–4.0)
MCH: 30.1 pg (ref 26.0–34.0)
MCHC: 31.8 g/dL (ref 30.0–36.0)
MCV: 94.7 fL (ref 78.0–100.0)
Monocytes Absolute: 1.2 10*3/uL — ABNORMAL HIGH (ref 0.1–1.0)
Monocytes Relative: 14 % — ABNORMAL HIGH (ref 3–12)
Neutro Abs: 5.8 10*3/uL (ref 1.7–7.7)
Neutrophils Relative %: 69 % (ref 43–77)
PLATELETS: 291 10*3/uL (ref 150–400)
RBC: 3.56 MIL/uL — ABNORMAL LOW (ref 3.87–5.11)
RDW: 14.1 % (ref 11.5–15.5)
WBC: 8.4 10*3/uL (ref 4.0–10.5)

## 2014-02-08 LAB — TSH: TSH: 1.856 u[IU]/mL (ref 0.350–4.500)

## 2014-02-08 LAB — URINE MICROSCOPIC-ADD ON

## 2014-02-08 LAB — CBC
HEMATOCRIT: 34.2 % — AB (ref 36.0–46.0)
Hemoglobin: 10.7 g/dL — ABNORMAL LOW (ref 12.0–15.0)
MCH: 28.8 pg (ref 26.0–34.0)
MCHC: 31.3 g/dL (ref 30.0–36.0)
MCV: 91.9 fL (ref 78.0–100.0)
Platelets: 300 10*3/uL (ref 150–400)
RBC: 3.72 MIL/uL — ABNORMAL LOW (ref 3.87–5.11)
RDW: 13.9 % (ref 11.5–15.5)
WBC: 8.4 10*3/uL (ref 4.0–10.5)

## 2014-02-08 LAB — TROPONIN I: Troponin I: <0.03 ng/mL (ref <0.031)

## 2014-02-08 LAB — MAGNESIUM: MAGNESIUM: 2.2 mg/dL (ref 1.5–2.5)

## 2014-02-08 LAB — URINALYSIS, ROUTINE W REFLEX MICROSCOPIC
BILIRUBIN URINE: NEGATIVE
Glucose, UA: NEGATIVE mg/dL
Ketones, ur: NEGATIVE mg/dL
Nitrite: NEGATIVE
Protein, ur: 100 mg/dL — AB
Specific Gravity, Urine: 1.012 (ref 1.005–1.030)
UROBILINOGEN UA: 0.2 mg/dL (ref 0.0–1.0)
pH: 6 (ref 5.0–8.0)

## 2014-02-08 LAB — BASIC METABOLIC PANEL
Anion gap: 9 (ref 5–15)
BUN: 27 mg/dL — ABNORMAL HIGH (ref 6–23)
CALCIUM: 9 mg/dL (ref 8.4–10.5)
CO2: 26 mmol/L (ref 19–32)
Chloride: 104 mEq/L (ref 96–112)
Creatinine, Ser: 1.05 mg/dL (ref 0.50–1.10)
GFR calc Af Amer: 52 mL/min — ABNORMAL LOW (ref >90)
GFR calc non Af Amer: 45 mL/min — ABNORMAL LOW (ref >90)
GLUCOSE: 139 mg/dL — AB (ref 70–99)
POTASSIUM: 3.8 mmol/L (ref 3.5–5.1)
SODIUM: 139 mmol/L (ref 135–145)

## 2014-02-08 LAB — BRAIN NATRIURETIC PEPTIDE: B NATRIURETIC PEPTIDE 5: 73.7 pg/mL (ref 0.0–100.0)

## 2014-02-08 LAB — I-STAT TROPONIN, ED: Troponin i, poc: 0 ng/mL (ref 0.00–0.08)

## 2014-02-08 LAB — LACTIC ACID, PLASMA: Lactic Acid, Venous: 0.9 mmol/L (ref 0.5–2.2)

## 2014-02-08 LAB — MRSA PCR SCREENING: MRSA by PCR: NEGATIVE

## 2014-02-08 MED ORDER — FERROUS SULFATE 325 (65 FE) MG PO TABS
325.0000 mg | ORAL_TABLET | Freq: Two times a day (BID) | ORAL | Status: DC
  Administered 2014-02-08 – 2014-02-18 (×19): 325 mg via ORAL
  Filled 2014-02-08 (×24): qty 1

## 2014-02-08 MED ORDER — ACETAMINOPHEN 650 MG RE SUPP
650.0000 mg | Freq: Four times a day (QID) | RECTAL | Status: DC | PRN
  Administered 2014-02-08 – 2014-02-09 (×3): 650 mg via RECTAL
  Filled 2014-02-08 (×3): qty 1

## 2014-02-08 MED ORDER — HYDROCODONE-ACETAMINOPHEN 5-325 MG PO TABS: 1.0000 | ORAL_TABLET | Freq: Four times a day (QID) | ORAL | Status: DC | PRN

## 2014-02-08 MED ORDER — SODIUM CHLORIDE 0.9 % IJ SOLN
3.0000 mL | Freq: Two times a day (BID) | INTRAMUSCULAR | Status: DC
  Administered 2014-02-08 – 2014-02-17 (×13): 3 mL via INTRAVENOUS

## 2014-02-08 MED ORDER — DICLOFENAC SODIUM 1 % TD GEL
2.0000 g | Freq: Two times a day (BID) | TRANSDERMAL | Status: DC
  Administered 2014-02-08 – 2014-02-18 (×19): 2 g via TOPICAL
  Filled 2014-02-08 (×3): qty 100

## 2014-02-08 MED ORDER — CEFEPIME HCL 1 G IJ SOLR
1.0000 g | INTRAMUSCULAR | Status: DC
  Administered 2014-02-08 – 2014-02-14 (×7): 1 g via INTRAVENOUS
  Filled 2014-02-08 (×9): qty 1

## 2014-02-08 MED ORDER — ONDANSETRON HCL 4 MG PO TABS
4.0000 mg | ORAL_TABLET | Freq: Four times a day (QID) | ORAL | Status: DC | PRN
  Filled 2014-02-08: qty 1

## 2014-02-08 MED ORDER — ENSURE COMPLETE PO LIQD
237.0000 mL | Freq: Two times a day (BID) | ORAL | Status: DC
  Administered 2014-02-08: 237 mL via ORAL

## 2014-02-08 MED ORDER — CLOTRIMAZOLE 1 % EX CREA: 1.0000 "application " | TOPICAL_CREAM | Freq: Two times a day (BID) | CUTANEOUS | Status: DC | PRN

## 2014-02-08 MED ORDER — DOCUSATE SODIUM 100 MG PO CAPS
200.0000 mg | ORAL_CAPSULE | Freq: Two times a day (BID) | ORAL | Status: DC
  Administered 2014-02-08: 200 mg via ORAL
  Filled 2014-02-08 (×6): qty 2

## 2014-02-08 MED ORDER — SODIUM CHLORIDE 0.9 % IJ SOLN: 3.0000 mL | Freq: Two times a day (BID) | INTRAMUSCULAR | Status: DC

## 2014-02-08 MED ORDER — SODIUM CHLORIDE 0.9 % IV SOLN
INTRAVENOUS | Status: DC
  Administered 2014-02-08 – 2014-02-09 (×5): via INTRAVENOUS

## 2014-02-08 MED ORDER — CRANBERRY 250 MG PO CAPS: 500.0000 mg | ORAL_CAPSULE | Freq: Two times a day (BID) | ORAL | Status: DC

## 2014-02-08 MED ORDER — ONDANSETRON HCL 4 MG/2ML IJ SOLN
4.0000 mg | Freq: Four times a day (QID) | INTRAMUSCULAR | Status: DC | PRN
  Administered 2014-02-08: 4 mg via INTRAVENOUS
  Filled 2014-02-08: qty 2

## 2014-02-08 MED ORDER — SODIUM CHLORIDE 0.9 % IV BOLUS (SEPSIS)
250.0000 mL | Freq: Once | INTRAVENOUS | Status: AC
  Administered 2014-02-08: 250 mL via INTRAVENOUS

## 2014-02-08 MED ORDER — SODIUM CHLORIDE 0.9 % IV SOLN
250.0000 mL | INTRAVENOUS | Status: DC | PRN
  Administered 2014-02-08: 250 mL via INTRAVENOUS

## 2014-02-08 MED ORDER — HEPARIN SODIUM (PORCINE) 5000 UNIT/ML IJ SOLN
5000.0000 [IU] | Freq: Three times a day (TID) | INTRAMUSCULAR | Status: DC
  Administered 2014-02-08 – 2014-02-18 (×29): 5000 [IU] via SUBCUTANEOUS
  Filled 2014-02-08 (×35): qty 1

## 2014-02-08 MED ORDER — PANTOPRAZOLE SODIUM 40 MG PO TBEC
40.0000 mg | DELAYED_RELEASE_TABLET | Freq: Every day | ORAL | Status: DC
  Administered 2014-02-08 – 2014-02-18 (×10): 40 mg via ORAL
  Filled 2014-02-08 (×11): qty 1

## 2014-02-08 MED ORDER — CEFTRIAXONE SODIUM IN DEXTROSE 20 MG/ML IV SOLN
1.0000 g | INTRAVENOUS | Status: DC
  Administered 2014-02-08: 1 g via INTRAVENOUS
  Filled 2014-02-08: qty 50

## 2014-02-08 MED ORDER — ACETAMINOPHEN 325 MG PO TABS
650.0000 mg | ORAL_TABLET | Freq: Four times a day (QID) | ORAL | Status: DC | PRN
  Administered 2014-02-09 – 2014-02-12 (×4): 650 mg via ORAL
  Filled 2014-02-08 (×6): qty 2

## 2014-02-08 MED ORDER — TRIAMCINOLONE ACETONIDE 0.1 % EX CREA
1.0000 "application " | TOPICAL_CREAM | Freq: Two times a day (BID) | CUTANEOUS | Status: DC
  Administered 2014-02-08 – 2014-02-18 (×19): 1 via TOPICAL
  Filled 2014-02-08 (×3): qty 15

## 2014-02-08 MED ORDER — LEVOTHYROXINE SODIUM 75 MCG PO TABS
75.0000 ug | ORAL_TABLET | Freq: Every day | ORAL | Status: DC
  Administered 2014-02-08 – 2014-02-18 (×10): 75 ug via ORAL
  Filled 2014-02-08 (×13): qty 1

## 2014-02-08 MED ORDER — SODIUM CHLORIDE 0.9 % IJ SOLN: 3.0000 mL | INTRAMUSCULAR | Status: DC | PRN

## 2014-02-08 MED ORDER — ENALAPRIL MALEATE 5 MG PO TABS
5.0000 mg | ORAL_TABLET | Freq: Every day | ORAL | Status: DC
  Administered 2014-02-08: 5 mg via ORAL
  Filled 2014-02-08: qty 1

## 2014-02-08 MED ORDER — FUROSEMIDE 10 MG/ML IJ SOLN
40.0000 mg | Freq: Once | INTRAMUSCULAR | Status: AC
  Administered 2014-02-08: 40 mg via INTRAVENOUS
  Filled 2014-02-08: qty 4

## 2014-02-08 NOTE — Progress Notes (Signed)
[facility] PROGRESS NOTE  [name] NWG:[id number] DOB: 04/25/1919 DOA: 02/07/2014 PCP: Illene Regulus, MD  Assessment/Plan: 1. Sepsis -Present on admission, evidenced by temperature of 102.5, heart rate of 103, respiratory rate of 28 -Source of infection likely to be urinary tract infection -Blood cultures were obtained -Patient was administered a 500 mL bolus of normal saline, will continue maintenance fluids running at 75 mL/hour -Will check a lactate level, monitor volume status closely, follow-up on blood cultures and urine cultures.  2.  Question congestive heart failure -Initial two-view chest x-ray reported by radiology to have findings consistent with acute CHF, labs however showing BNP of 73.7 -She was administered IV fluids given presence of sepsis -Follow-up on transthoracic echocardiogram that was done on 02/08/2014 -Monitor volume status closely  3.  Urinary tract infection. -Patient having a syncopal event at her nursing home -Presenting with sepsis -Urinalysis showed presence of many bacteria along with leukocyte esterase -She was started on ceftriaxone 1 g IV every 24 hours  4.  Syncope -Likely secondary to underlying infectious process and probable dehydration as she presents with sepsis -Will provide IV fluid resuscitation, starting IV antimicrobial therapy  5.  Hypertension.  -Patient's blood pressures fluctuating over the course of the day with systolic blood pressures ranging between 109 and 157 -She is septic, I will hold antihypertensive agents for now.  Code Status: DO NOT RESUSCITATE Family Communication: I spoke to her daughter was present at bedside Disposition Plan: Anticipate discharge to her skilled nursing facility when medically stable   Antibiotics:  Ceftriaxone 1 g IV every 24 hours  HPI/Subjective: Patient is a pleasant 81 year old female with a past medical history of cognitive impairment, currently SNF resident. Was  admitted to the medicine service early this morning presented as a transfer from her facility for complaints of mental status changes and having a syncopal event. Workup in the emergency department included a chest x-ray which was interpreted by radiology to have developing cardiomegaly, basilar congestion possible pulmonary edema. Patient was administered IV Lasix. She did not appear volume overloaded however and BNP came back at 73.7. Urinalysis showed the presence of many bacteria along with large amount of leukocytes. She was started on empiric IV antimicrobial therapy with ceftriaxone 1 g IV every 24 hours. Patient appear to be dehydrated rather than volume overloaded and was administered 500 mL bolus of normal saline. Furthermore she was found to have an elevated temperature of 102.5 and 103.6. She was given acetaminophen therapy. She likely has sepsis due to urinary tract infection.   Objective: Filed Vitals:   02/08/14 1600  BP:   Pulse:   Temp: 103.6 F (39.8 C)  Resp:     Intake/Output Summary (Last 24 hours) at 02/08/14 1703 Last data filed at 02/08/14 1412  Gross per 24 hour  Intake    537 ml  Output      0 ml  Net    537 ml   Filed Weights   02/08/14 0330  Weight: 52.1 kg (114 lb 13.8 oz)    Exam:   General:  Ill-appearing, toxic, she is arousable and can follow commands  Cardiovascular: Regular rate and rhythm normal S1-S2  Respiratory: Patient having normal respiratory effort, lungs were clear to auscultation during my evaluation  Abdomen: Soft nontender nondistended  Musculoskeletal: She does not have evidence of edema.  Data Reviewed: Basic Metabolic Panel:  Recent Labs Lab 02/08/14 0036 02/08/14 0359  NA 139 139  K 4.0 3.8  CL 105 104  CO2  --  26  GLUCOSE 136* 139*  BUN 30* 27*  CREATININE 1.10 1.05  CALCIUM  --  9.0  MG  --  2.2   Liver Function Tests: No results for input(s): AST, ALT, ALKPHOS, BILITOT, PROT, ALBUMIN in the last 168  hours. No results for input(s): LIPASE, AMYLASE in the last 168 hours. No results for input(s): AMMONIA in the last 168 hours. CBC:  Recent Labs Lab 02/08/14 0017 02/08/14 0036 02/08/14 0359  WBC 8.4  --  8.4  NEUTROABS 5.8  --   --   HGB 10.7* 12.2 10.7*  HCT 33.7* 36.0 34.2*  MCV 94.7  --  91.9  PLT 291  --  300   Cardiac Enzymes:  Recent Labs Lab 02/08/14 0359 02/08/14 0905  TROPONINI <0.03 <0.03   BNP (last 3 results) No results for input(s): PROBNP in the last 8760 hours. CBG: No results for input(s): GLUCAP in the last 168 hours.  Recent Results (from the past 240 hour(s))  MRSA PCR Screening     Status: None   Collection Time: 02/08/14  6:38 AM  Result Value Ref Range Status   MRSA by PCR NEGATIVE NEGATIVE Final    Comment:        The GeneXpert MRSA Assay (FDA approved for NASAL specimens only), is one component of a comprehensive MRSA colonization surveillance program. It is not intended to diagnose MRSA infection nor to guide or monitor treatment for MRSA infections.      Studies: Dg Chest 2 View  02/08/2014   CLINICAL DATA:  Mid chest pain.  EXAM: CHEST  2 VIEW  COMPARISON:  08/06/2012  FINDINGS: Mild cardiomegaly. Tortuosity of the thoracic aorta, similar to prior exam. There is increasing vascular congestion and questionable mild pulmonary edema. Small bilateral pleural effusions have developed. There is a large retrocardiac hiatal hernia. No confluent airspace disease to suggest pneumonia. No pneumothorax. No acute osseous abnormalities. Minimal anterior wedging in the mid thoracic spine.  IMPRESSION: Development of cardiomegaly, small bilateral pleural effusions, vascular congestion and possible pulmonary edema. Findings suggest fluid overload/CHF.   Electronically Signed   By: Rubye OaksMelanie  Ehinger M.D.   On: 02/08/2014 00:13    Scheduled Meds: . cefTRIAXone (ROCEPHIN)  IV  1 g Intravenous Q24H  . diclofenac sodium  2 g Topical BID  . docusate sodium   200 mg Oral BID  . enalapril  5 mg Oral Daily  . feeding supplement (ENSURE COMPLETE)  237 mL Oral BID BM  . ferrous sulfate  325 mg Oral BID WC  . heparin  5,000 Units Subcutaneous 3 times per day  . levothyroxine  75 mcg Oral QAC breakfast  . pantoprazole  40 mg Oral Daily  . sodium chloride  3 mL Intravenous Q12H  . triamcinolone cream  1 application Topical BID   Continuous Infusions:   Principal Problem:   Syncope Active Problems:   Hypothyroidism   Essential hypertension   COPD (chronic obstructive pulmonary disease)   GASTROESOPHAGEAL REFLUX DISEASE   Dementia of Alzheimer's type with behavioral disturbance    Time spent:     Jeralyn BennettZAMORA, Tailor Lucking  [facility] Pager [phone]. If 7PM-7AM, please contact night-coverage at [email or web address], password [email or web address] 02/08/2014, 5:03 PM  LOS: 1 day

## 2014-02-08 NOTE — Evaluation (Signed)
Physical Therapy Evaluation Patient Details Name: [name] MRN: [id number] DOB: 10/07/1919 Today's Date: 02/08/2014   History of Present Illness  81 y.o. female with past medical history of hypothyroidism, hypertension, GERD, dementia, COPD, hx of GIB, who presents with syncope  Clinical Impression  Patient demonstrates deficits in functional mobility as indicated below. Per family, patient was mobile with minimal assist and use of RW at facility. Patient's family expressed concerns re: right hip pain. Assessed hip pain and ROM, initially tight/ contracted, but able to obtain full extension with rhythmic passive ROM and no evidence of pain/grimmacing.  Patient very lethargic during assessment, deferred further mobility at this time, will follow up next session for mobility increased and activity tolerance. Educated family regarding importance of OOB and mobility as patient begins to feel better. Will see as indicated and progress as tolerated.    Follow Up Recommendations SNF;Supervision/Assistance - 24 hour (return to memory care unit)    Equipment Recommendations  None recommended by PT    Recommendations for Other Services       Precautions / Restrictions Precautions Precautions: Fall Restrictions Weight Bearing Restrictions: No      Mobility  Bed Mobility                  Transfers                    Ambulation/Gait                Stairs            Wheelchair Mobility    Modified Rankin (Stroke Patients Only)       Balance                                             Pertinent Vitals/Pain Pain Assessment: No/denies pain    Home Living Family/patient expects to be discharged to:: Skilled nursing facility                      Prior Function Level of Independence: Needs assistance (Pt grandson is present and stating that [facility], last 2-834yrs on demintia care unit per  grandson)   Gait / Transfers Assistance Needed: Has RW, uses with supervision during the day, more assist provided at night  ADL's / Homemaking Assistance Needed: Requires assistance with all aspects of ADL's and self care tasks, lives on dementia care unit of SNF x2-3 yrs per pt grandson.  Comments: Pt confused     Hand Dominance   Dominant Hand: Right    Extremity/Trunk Assessment               Lower Extremity Assessment: Generalized weakness;RLE deficits/detail;LLE deficits/detail;Difficult to assess due to impaired cognition   LLE Deficits / Details: left hip pain reported, limited ROM actively, but able to extend passively with rhythmin initiaition technique     Communication   Communication: Other (comment);HOH (Pt hears better out of left ear)  Cognition Arousal/Alertness: Lethargic  Behavior During Therapy: Restless Overall Cognitive Status: History of cognitive impairments - at baseline (Pt grandson reports that pt is at baseline level of confusion secondary to dementia)       Memory: Decreased short-term memory;Decreased recall of precautions              General Comments  Exercises        Assessment/Plan    PT Assessment Patient needs continued PT services  PT Diagnosis Difficulty walking;Altered mental status   PT Problem List Decreased strength;Decreased range of motion;Decreased activity tolerance;Decreased balance;Decreased mobility;Decreased cognition  PT Treatment Interventions DME instruction;Gait training;Functional mobility training;Therapeutic activities;Therapeutic exercise;Balance training;Patient/family education   PT Goals (Current goals can be found in the Care Plan section) Acute Rehab PT Goals Patient Stated Goal: goal to return to memory care unit PT Goal Formulation: With family Time For Goal Achievement: 02/22/14 Potential to Achieve Goals: Fair    Frequency Min 3X/week   Barriers to discharge         Co-evaluation               End of Session   Activity Tolerance: Patient limited by fatigue Patient left: in bed;with call bell/phone within reach;with bed alarm set;with family/visitor present Nurse Communication: Mobility status         Time: 1610-9604 PT Time Calculation (min) (ACUTE ONLY): 14 min   Charges:   PT Evaluation $Initial PT Evaluation Tier I: 1 Procedure PT Treatments $Therapeutic Activity: 8-22 mins   PT G CodesFabio Asa 2014/03/04, 3:23 PM Charlotte Crumb, PT DPT  [phone]

## 2014-02-08 NOTE — Progress Notes (Signed)
PHARMACIST - PHYSICIAN ORDER COMMUNICATION  CONCERNING: P&T Medication Policy on Herbal Medications  DESCRIPTION:  This patient's order for:  Cranberry  has been noted.  This product(s) is classified as an "herbal" or natural product. Due to a lack of definitive safety studies or FDA approval, nonstandard manufacturing practices, plus the potential risk of unknown drug-drug interactions while on inpatient medications, the Pharmacy and Therapeutics Committee does not permit the use of "herbal" or natural products of this type within [place].   ACTION TAKEN: The pharmacy department is unable to verify this order at this time and your patient has been informed of this safety policy. Please reevaluate patient's clinical condition at discharge and address if the herbal or natural product(s) should be resumed at that time.   

## 2014-02-08 NOTE — ED Notes (Signed)
Family at bedside. 

## 2014-02-08 NOTE — Evaluation (Signed)
Clinical/Bedside Swallow Evaluation Patient Details  Name: [name] MRN: [id number] Date of Birth: 1919-08-01  Today's Date: 02/08/2014 Time: 1540-1630 SLP Time Calculation (min) (ACUTE ONLY): 50 min  Past Medical History:  Past Medical History  Diagnosis Date  . Unspecified hypothyroidism   . Disorder of bone and cartilage, unspecified   . Chronic airway obstruction, not elsewhere classified   . Unspecified essential hypertension   . Esophageal reflux   . Other and unspecified hyperlipidemia   . Dementia    Past Surgical History:  Past Surgical History  Procedure Laterality Date  . Cataract extractions      bilateral  . Abdominal hysterectomy      total  . Bilateral salpingoopherectomy    . Rectal polyp removal      No malignancy identified   HPI:  81 y.o. female with past medical history of hypothyroidism, hypertension, GERD, dementia, COPD, hx of GIB, who presents with syncope   Assessment / Plan / Recommendation Clinical Impression  Patient presents with suspected acute reversible dysphagia that is a result of inadequate detention and cognition.  Cues for small, single straw sips resulted in no overt s/s o aspiration and pureed consistencies were within functional limits.  Dys. 2 textures resulted in prolonged and inadequate mastication without upper dentures due to gagging with placement. Recommend initiation of ys.1 textures and thin liquids with full supervision/assist for safety with plans for SLP follow up and diet advancement as patient recovers.  SLP educated family on recommendations and use of safe swallow strategies during PO intake; daughter and grandson verbalized understanding of information.      Aspiration Risk  Mild    Diet Recommendation Dysphagia 1 (Puree);Thin liquid   Liquid Administration via: Cup;Straw Medication Administration: Crushed with puree Supervision: Full supervision/cueing for compensatory strategies Compensations: Slow  rate;Small sips/bites;Check for pocketing Postural Changes and/or Swallow Maneuvers: Seated upright 90 degrees;Upright 30-60 min after meal    Other  Recommendations Oral Care Recommendations: Oral care BID Other Recommendations: Have oral suction available   Follow Up Recommendations  Other (comment) (TBD)    Frequency and Duration min 2x/week  1 week   Pertinent Vitals/Pain none    SLP Swallow Goals  see care plan    Swallow Study Prior Functional Status       General HPI: 81 y.o. female with past medical history of hypothyroidism, hypertension, GERD, dementia, COPD, hx of GIB, who presents with syncope Type of Study: Bedside swallow evaluation Previous Swallow Assessment: none on record Diet Prior to this Study: NPO Temperature Spikes Noted: Yes Respiratory Status: Room air History of Recent Intubation: No Behavior/Cognition: Cooperative;Pleasant mood;Confused;Requires cueing;Hard of hearing Oral Cavity - Dentition: Edentulous (attempted top dentures but resulted in gagging) Self-Feeding Abilities: Total assist Patient Positioning: Upright in bed Baseline Vocal Quality: Clear;Low vocal intensity Volitional Cough: Cognitively unable to elicit Volitional Swallow: Unable to elicit    Oral/Motor/Sensory Function Overall Oral Motor/Sensory Function: Appears within functional limits for tasks assessed   Ice Chips Ice chips: Within functional limits   Thin Liquid Thin Liquid: Impaired Presentation: Straw Pharyngeal  Phase Impairments: Multiple swallows Other Comments: cues for small, single sips resolved multiple swallows     Nectar Thick Nectar Thick Liquid: Not tested   Honey Thick Honey Thick Liquid: Not tested   Puree Puree: Within functional limits Presentation: Spoon   Solid   GO    Solid: Impaired Oral Phase Impairments: Impaired mastication;Poor awareness of bolus Oral Phase Functional Implications: Oral holding Other Comments: appeared  to be a result of  inadequate detention and cognition        Charlane FerrettiMelissa Marlen Koman, M.A., CCC-SLP [phone]   [name] 02/08/2014,4:46 PM

## 2014-02-08 NOTE — Progress Notes (Signed)
ANTIBIOTIC CONSULT NOTE - INITIAL  Pharmacy Consult for cefepime Indication: sepsis  No Known Allergies  Patient Measurements: Height: 5' (152.4 cm) Weight: 114 lb 13.8 oz (52.1 kg) IBW/kg (Calculated) : 45.5  Vital Signs: Temp: 104 F (40 C) (01/12 1846) Temp Source: Rectal (01/12 1846) BP: 145/65 mmHg (01/12 1846) Pulse Rate: 105 (01/12 1846) Intake/Output from previous day:   Intake/Output from this shift:    Labs:  Recent Labs  02/08/14 0017 02/08/14 0036 02/08/14 0359  WBC 8.4  --  8.4  HGB 10.7* 12.2 10.7*  PLT 291  --  300  CREATININE  --  1.10 1.05   Estimated Creatinine Clearance: 24.6 mL/min (by C-G formula based on Cr of 1.05). No results for input(s): VANCOTROUGH, VANCOPEAK, VANCORANDOM, GENTTROUGH, GENTPEAK, GENTRANDOM, TOBRATROUGH, TOBRAPEAK, TOBRARND, AMIKACINPEAK, AMIKACINTROU, AMIKACIN in the last 72 hours.   Microbiology: Recent Results (from the past 720 hour(s))  MRSA PCR Screening     Status: None   Collection Time: 02/08/14  6:38 AM  Result Value Ref Range Status   MRSA by PCR NEGATIVE NEGATIVE Final    Comment:        The GeneXpert MRSA Assay (FDA approved for NASAL specimens only), is one component of a comprehensive MRSA colonization surveillance program. It is not intended to diagnose MRSA infection nor to guide or monitor treatment for MRSA infections.     Medical History: Past Medical History  Diagnosis Date  . Unspecified hypothyroidism   . Disorder of bone and cartilage, unspecified   . Chronic airway obstruction, not elsewhere classified   . Unspecified essential hypertension   . Esophageal reflux   . Other and unspecified hyperlipidemia   . Dementia     Medications:  Prescriptions prior to admission  Medication Sig Dispense Refill Last Dose  . acetaminophen (TYLENOL) 325 MG tablet Take 325 mg by mouth 2 (two) times daily.   02/07/2014 at Unknown time  . acetaminophen (TYLENOL) 325 MG tablet Take 650 mg by mouth  every 6 (six) hours as needed for mild pain.    unknown  . clotrimazole (LOTRIMIN) 1 % cream Apply 1 application topically 2 (two) times daily as needed (to affected skin).    unknown  . Cranberry 250 MG CAPS Take 500 mg by mouth 2 (two) times daily.   02/07/2014 at Unknown time  . diclofenac sodium (VOLTAREN) 1 % GEL Apply 2 g topically 2 (two) times daily. Applied to right shoulder   02/07/2014 at Unknown time  . docusate (COLACE) 50 MG/5ML liquid Take 200 mg by mouth 2 (two) times daily.   02/07/2014 at Unknown time  . enalapril (VASOTEC) 2.5 MG tablet Take 2.5 mg by mouth daily.   02/07/2014 at Unknown time  . ferrous sulfate 325 (65 FE) MG tablet Take 325 mg by mouth 2 (two) times daily with a meal.    02/07/2014 at Unknown time  . HYDROcodone-acetaminophen (NORCO/VICODIN) 5-325 MG per tablet Take 1 tablet by mouth every 6 (six) hours as needed for moderate pain or severe pain. 10 tablet 0 unknown  . levothyroxine (SYNTHROID, LEVOTHROID) 75 MCG tablet Take 75 mcg by mouth daily before breakfast.   02/07/2014 at Unknown time  . Nutritional Supplements (NUTRITIONAL DRINK PO) Take 1 Container by mouth 2 (two) times daily. House shakes   02/07/2014 at Unknown time  . ondansetron (ZOFRAN) 4 MG tablet Take 4 mg by mouth every 6 (six) hours as needed for nausea or vomiting.   unknown  . OVER THE  COUNTER MEDICATION Take 1 tablet by mouth daily. Therapy M 9mg -400mcg   02/07/2014 at Unknown time  . Eyelid Cleansers (OCUSOFT LID SCRUB EX) Apply 1 application topically daily. Use to clean both eyes every day   11/22/2013 at Unknown time  . omeprazole (PRILOSEC) 20 MG capsule Take 20 mg by mouth daily.    11/21/2013 at Unknown time  . triamcinolone cream (KENALOG) 0.1 % Apply 1 application topically 2 (two) times daily. Applied to face, arms, hands and legs.   11/22/2013 at Unknown time   Assessment: 81 year old woman initially started on ceftriaxone to be transitioned to cefepime for sepsis.  Her creatinine is  1.05 and her estimated creatinine clearance is 3725mL/min  Goal of Therapy:  Treat infection  Plan:  cefepime 1g IV q24  Monitor cultures and renal function   Mickeal SkinnerFrens, [name] 02/08/2014,7:07 PM

## 2014-02-08 NOTE — Progress Notes (Signed)
Report given to nurse in [facility] for room 5.  Pt transferred without incident.

## 2014-02-08 NOTE — Progress Notes (Signed)
UR completed Syndey Jaskolski K. Million Maharaj, RN, BSN, MSHL, CCM  02/08/2014 11:42 AM

## 2014-02-08 NOTE — Progress Notes (Signed)
Follow up note  Patient spiking a high fever of 104 despite getting tylenol earlier. Will transfer to SDU, place cooling blanket, change Rocephin for Cefepime in order to expand coverage. Family members updated.

## 2014-02-08 NOTE — Progress Notes (Signed)
MRSA test results are NEGATIVE, so pt will be taken off of contact precautions.

## 2014-02-08 NOTE — Progress Notes (Signed)
DNR bracelet placed on pt, confirmed with daughter at bedside.

## 2014-02-08 NOTE — Evaluation (Signed)
Occupational Therapy Evaluation Patient Details Name: [name] MRN: [id number] DOB: [birth year] Today's Date: 02/08/2014    History of Present Illness 81 y.o. female with past medical history of hypothyroidism, hypertension, GERD, dementia, COPD, hx of GIB, who presents with syncope   Clinical Impression   OT assessment completed w/ assistance of pt's grandson whom states that pt lives at [facility] on dementia care unit. Pt's grandson reports pt level of confusion appears at baseline and plan is for her to return to same facility. Pt grandson was educated that pt is not appropriate candidate for skilled acute OT and verbalized understanding. Will sign off at this time.    Follow Up Recommendations  SNF;Supervision/Assistance - 24 hour (Pt family reports plan for pt to return to SNF dementia care unit)    Equipment Recommendations  None recommended by OT    Recommendations for Other Services       Precautions / Restrictions Precautions Precautions: Fall Restrictions Weight Bearing Restrictions: No      Mobility Bed Mobility Overal bed mobility: Needs Assistance Bed Mobility: Supine to Sit;Sit to Supine     Supine to sit: Min assist Sit to supine: Mod assist   General bed mobility comments: Assist with UB/LB secondary to dementia and cog impairements, difficulty following commands  Transfers Overall transfer level: Needs assistance Equipment used: Rolling walker (2 wheeled) Transfers: Sit to/from [employer] Sit to Stand: Min assist Stand pivot transfers: Mod assist       General transfer comment: Pt requires consistent vc and tc secondary to decreased ability to follow 1 step commands.    Balance Overall balance assessment: Needs assistance Sitting-balance support: No upper extremity supported;Feet supported Sitting balance-Leahy Scale: Fair     Standing balance support: Bilateral upper extremity supported Standing balance-Leahy Scale:  Fair Standing balance comment: VC's for safety and hand placement secondary to dementia                            ADL Overall ADL's : Needs assistance/impaired;At baseline Eating/Feeding: Set up;Sitting;Bed level   Grooming: Wash/dry hands;Wash/dry face;Minimal assistance;Sitting   Upper Body Bathing: Minimal assitance;Sitting   Lower Body Bathing: Maximal assistance;Sit to/from stand   Upper Body Dressing : Minimal assistance;Sitting   Lower Body Dressing: Maximal assistance;Sit to/from stand   Toilet Transfer: Minimal assistance;RW;Grab bars;Ambulation;Regular Toilet (Pt was incontinent of bladder x3 noted (once in bed, and 2xin standing in room EOB and at toilet). RN made aware & pt assisted in bathing/hygeine)   Toileting- Clothing Manipulation and Hygiene: Total assistance;Sit to/from stand   Tub/ Engineer, structural:  (NT)   Functional mobility during ADLs: Minimal assistance;Rolling walker;Cueing for safety;Cueing for sequencing (Increased time for tasks and not able to follow 1 step commands consistently) General ADL Comments: Pt's grandson was educated in role of OT and that pt appears at baseline level of care for ADL's secondary to advanced dementia and having lived in SNF x8 yrs and 2-3 on dementia care unit. Pt grandson was educated that pt is not appropriate candidate for skilled OT, he verbalized understanding of this and stated that pt/family plans to return to SNF where she will get necessary care and assist with ADL's. Will sign off acute OT at this time.     Vision                     Perception     Praxis      Pertinent  Vitals/Pain Pain Assessment: No/denies pain     Hand Dominance Right   Extremity/Trunk Assessment Upper Extremity Assessment Upper Extremity Assessment: Generalized weakness (Pt grnadson reports old LUE shoulder injury after fall that "they tried therapy for but she couln't do it" at ?SNF)   Lower Extremity  Assessment Lower Extremity Assessment: Defer to PT evaluation       Communication Communication Communication: Other (comment);HOH (Pt hears better out of left ear)   Cognition Arousal/Alertness: Awake/alert Behavior During Therapy: Restless;Anxious Overall Cognitive Status: History of cognitive impairments - at baseline (Pt grandson reports that pt is at baseline level of confusion secondary to dementia)       Memory: Decreased short-term memory;Decreased recall of precautions             General Comments       Exercises       Shoulder Instructions      Home Living Family/patient expects to be discharged to:: Skilled nursing facility                                        Prior Functioning/Environment Level of Independence: Needs assistance (Pt grandson is present and stating that pt has lived at SNF for ~8 yrs, last 2-6853yrs on demintia care unit per grandson)  Gait / Transfers Assistance Needed: Has RW and requires assistance at noc greater than during day per grandson report ADL's / Homemaking Assistance Needed: Requires assistance with all aspects of ADL's and self care tasks, lives on dementia care unit of SNF x2-3 yrs per pt grandson.   Comments: Pt confused    OT Diagnosis: Cognitive deficits;Generalized weakness   OT Problem List:     OT Treatment/Interventions:      OT Goals(Current goals can be found in the care plan section) Acute Rehab OT Goals Patient Stated Goal: Unable/N/A OT Goal Formulation: All assessment and education complete, DC therapy  OT Frequency:     Barriers to D/C:            Co-evaluation              End of Session Equipment Utilized During Treatment: Gait belt;Rolling walker Nurse Communication: Mobility status;Other (comment) (No further OT recommended)  Activity Tolerance: Patient tolerated treatment well Patient left: in bed;with call bell/phone within reach;with bed alarm set;with nursing/sitter in  room;with family/visitor present   Time: [id number] OT Time Calculation (min): 40 min Charges:  OT General Charges $OT Visit: 1 Procedure OT Evaluation $Initial OT Evaluation Tier I: 1 Procedure OT Treatments $Self Care/Home Management : 23-37 mins (25 min) G-Codes:    Roselie AwkwardBarnhill, Amy Beth Dixon, OTR/L 02/08/2014, 10:52 AM

## 2014-02-08 NOTE — Progress Notes (Signed)
Pt admitted from ED with syncope and UTI, pt alert/confused, pt inc of bowel and bladder, no c/o pain, bed alarm on, safety maintained

## 2014-02-08 NOTE — Progress Notes (Signed)
  Echocardiogram 2D Echocardiogram has been performed.  Arvil ChacoFoster, Addis Tuohy 02/08/2014, 1:19 PM

## 2014-02-08 NOTE — H&P (Addendum)
[facility] History and Physical  [name] UJW:[id number] DOB: [birth year] DOA: 02/07/2014  Referring physician: ED physician PCP: Illene Regulus, MD  Specialists:   Chief Complaint: syncope  HPI: [name] is a 81 y.o. female with past medical history of hypothyroidism, hypertension, GERD, dementia, COPD, hx of GIB, who presents with syncope.  Patient is from nursing home and brought in by EMS. Per EMS, patient passed out at about 11:30 PM when she was sitting on the commode. She passed out for about 5 minutes. Nurse was with the patient when the events happened. Patient did not fall and no injury from the event.  Patient did not bite her tongue, no seizure activities, no complaints of shortness of breath, chest pain, palpitation, unilateral weakness or numbness or tingling sensations in her extremities. She does not have fever, chills, fatigue, headaches, cough, chest pain, SOB, abdominal pain, diarrhea, constipation, dysuria, urgency, frequency, hematuria, skin rashes, joint pain or leg swelling. When I evaluated the patient in the emergency room, patient mental status was close to her baseline per her daughter. Patient looks comfortable in the bed.  Work up in the ED demonstrates vasculature congestion  by chest x-ray. Troponin negative. BNP 73.7. Urinalysis is positive for leukocytes, but negative for nitrite. EKG has no acute ischemic change. Patient is admitted to inpatient for further evaluation and treatment.  Review of Systems: As presented in the history of presenting illness, rest negative.  Where does patient live?  SNF Can patient participate in ADLs? none  Allergy: No Known Allergies  Past Medical History  Diagnosis Date  . Unspecified hypothyroidism   . Disorder of bone and cartilage, unspecified   . Chronic airway obstruction, not elsewhere classified   . Unspecified essential hypertension   . Esophageal reflux   . Other and unspecified  hyperlipidemia   . Dementia     Past Surgical History  Procedure Laterality Date  . Cataract extractions      bilateral  . Abdominal hysterectomy      total  . Bilateral salpingoopherectomy    . Rectal polyp removal      No malignancy identified    Social History:  reports that she has never smoked. She has never used smokeless tobacco. She reports that she does not drink alcohol or use illicit drugs.  Family History:  Family History  Problem Relation Age of Onset  . Heart failure Mother   . Cancer Father      Prior to Admission medications   Medication Sig Start Date End Date Taking? Authorizing Provider  acetaminophen (TYLENOL) 325 MG tablet Take 325 mg by mouth 2 (two) times daily.   Yes Historical Provider, MD  acetaminophen (TYLENOL) 325 MG tablet Take 650 mg by mouth every 6 (six) hours as needed for mild pain.    Yes Historical Provider, MD  clotrimazole (LOTRIMIN) 1 % cream Apply 1 application topically 2 (two) times daily as needed (to affected skin).    Yes Historical Provider, MD  Cranberry 250 MG CAPS Take 500 mg by mouth 2 (two) times daily.   Yes Historical Provider, MD  diclofenac sodium (VOLTAREN) 1 % GEL Apply 2 g topically 2 (two) times daily. Applied to right shoulder   Yes Historical Provider, MD  docusate (COLACE) 50 MG/5ML liquid Take 200 mg by mouth 2 (two) times daily.   Yes Historical Provider, MD  enalapril (VASOTEC) 2.5 MG tablet Take 2.5 mg by mouth daily.   Yes Historical Provider, MD  ferrous  sulfate 325 (65 FE) MG tablet Take 325 mg by mouth 2 (two) times daily with a meal.    Yes Historical Provider, MD  HYDROcodone-acetaminophen (NORCO/VICODIN) 5-325 MG per tablet Take 1 tablet by mouth every 6 (six) hours as needed for moderate pain or severe pain. 10/13/13  Yes Richardean Canal, MD  levothyroxine (SYNTHROID, LEVOTHROID) 75 MCG tablet Take 75 mcg by mouth daily before breakfast.   Yes Historical Provider, MD  Nutritional Supplements (NUTRITIONAL DRINK  PO) Take 1 Container by mouth 2 (two) times daily. House shakes   Yes Historical Provider, MD  ondansetron (ZOFRAN) 4 MG tablet Take 4 mg by mouth every 6 (six) hours as needed for nausea or vomiting.   Yes Historical Provider, MD  OVER THE COUNTER MEDICATION Take 1 tablet by mouth daily. Therapy M -   Yes Historical Provider, MD  Eyelid Cleansers (OCUSOFT LID SCRUB EX) Apply 1 application topically daily. Use to clean both eyes every day    Historical Provider, MD  omeprazole (PRILOSEC) 20 MG capsule Take 20 mg by mouth daily.     Historical Provider, MD  triamcinolone cream (KENALOG) 0.1 % Apply 1 application topically 2 (two) times daily. Applied to face, arms, hands and legs.    Historical Provider, MD    Physical Exam: Filed Vitals:   02/07/14 2355 02/08/14 0100 02/08/14 0115 02/08/14 0130  BP: 157/62 157/61 134/59 164/68  Pulse: 74 76 73 82  Resp: SpO2: 97% 100% 96% 100%   General: Not in acute distress HEENT:       Eyes: PERRL, EOMI, no scleral icterus       ENT: No discharge from the ears and nose, no pharynx injection, no tonsillar enlargement.        Neck: No JVD, no bruit, no mass felt. Cardiac: S1/S2, RRR, No murmurs, No gallops or rubs Pulm: has fine crackle bilaterally and posteriorly. No wheezing or bronchitis.  Abd: Soft, nondistended, nontender, no rebound pain, no organomegaly, BS present Ext: No edema bilaterally. 2+DP/PT pulse bilaterally Musculoskeletal: No joint deformities, erythema, or stiffness, ROM full Skin: No rashes.  Neuro: Alert and oriented X3, cranial nerves II-XII grossly intact, muscle strength 5/5 in all extremeties, sensation to light touch intact. Brachial reflex 2+ bilaterally. Knee reflex 1+ bilaterally. Negative Babinski's sign. Normal finger to nose test. Psych: Patient is not psychotic, no suicidal or hemocidal ideation.  Labs on Admission:  Basic Metabolic Panel:  Recent Labs Lab 02/08/14 0036  NA 139  K 4.0  CL  105  GLUCOSE 136*  BUN 30*  CREATININE 1.10   Liver Function Tests: No results for input(s): AST, ALT, ALKPHOS, BILITOT, PROT, ALBUMIN in the last 168 hours. No results for input(s): LIPASE, AMYLASE in the last 168 hours. No results for input(s): AMMONIA in the last 168 hours. CBC:  Recent Labs Lab 02/08/14 0017 02/08/14 0036  WBC 8.4  --   NEUTROABS 5.8  --   HGB 10.7* 12.2  HCT 33.7* 36.0  MCV 94.7  --   PLT 291  --    Cardiac Enzymes: No results for input(s): CKTOTAL, CKMB, CKMBINDEX, TROPONINI in the last 168 hours.  BNP (last 3 results) No results for input(s): PROBNP in the last 8760 hours. CBG: No results for input(s): GLUCAP in the last 168 hours.  Radiological Exams on Admission: Dg Chest 2 View  02/08/2014   CLINICAL DATA:  Mid chest pain.  EXAM: CHEST  2 VIEW  COMPARISON:  08/06/2012  FINDINGS: Mild cardiomegaly. Tortuosity of the thoracic aorta, similar to prior exam. There is increasing vascular congestion and questionable mild pulmonary edema. Small bilateral pleural effusions have developed. There is a large retrocardiac hiatal hernia. No confluent airspace disease to suggest pneumonia. No pneumothorax. No acute osseous abnormalities. Minimal anterior wedging in the mid thoracic spine.  IMPRESSION: Development of cardiomegaly, small bilateral pleural effusions, vascular congestion and possible pulmonary edema. Findings suggest fluid overload/CHF.   Electronically Signed   By: Rubye OaksMelanie  Ehinger M.D.   On: 02/08/2014 00:13    EKG: Independently reviewed.   Assessment/Plan Principal Problem:   Syncope Active Problems:   Hypothyroidism   Essential hypertension   COPD (chronic obstructive pulmonary disease)   GASTROESOPHAGEAL REFLUX DISEASE   Dementia of Alzheimer's type with behavioral disturbance   syncope: Etiology is not completely clear. Physical examination has no focal neurological findings. No signs of stroke. It is likely due to vasovagal syncope given  the situation where the syncope happened when patient was using commode. Patient's chest x-ray showed vascular congestion which is consistent with congestive heart failure. Patient does not have history of congestive heart failure, but her blood pressure is elevated on admission at SBP 170 mmHg. It is possible that patient has a flush of  pulmonary edema. Patient was already given 40 mg of lasix in ED. -admit to tele bed -will reevaluate her volume status in AM -BMP and Mg in AM -2d echo -trop x 3 -EKG in AM -patient's daughter dose not want to do CT-head. I think this is fine since patient dose not have any focal neurologic findings and her mental status is close to her baseline per her daughter.  -will not start ASA per her daughter since patient has hx of GIB -Check orthostatic vital sign. -Fall precaution  -follow up Urine culture  HTN: Patient is on low-dose of enalapril 2.5 mg daily. Her blood pressure is elevated on admission. -We'll increase enalapril dose from 2.5 mg to 5.0 mg daily.  CODP: Stable. Patient is not on medications at home. No signs of acute exacerbation. No wheezing or rhonchi on auscultation  -will observe closely  Hypothyroidism: Patient is on Synthroid at home. No TSH on record recently. Previous TSH was 1.35 on 07/17/10. -Continue Synthroid -Check TSH.  GERD:  -PPI    DVT ppx: SQ Heparin     Code Status: Full code Family Communication:   Yes, patient's   daughter    at bed side Disposition Plan: Admit to inpatient   Date of Service 02/08/2014    Lorretta HarpIU, Bharath Bernstein [facility] Pager [phone]  If 7PM-7AM, please contact night-coverage [email or web address] Password [email or web address] 02/08/2014, 2:35 AM

## 2014-02-09 ENCOUNTER — Inpatient Hospital Stay (HOSPITAL_COMMUNITY): Payer: Medicare Other

## 2014-02-09 LAB — URINALYSIS, ROUTINE W REFLEX MICROSCOPIC
BILIRUBIN URINE: NEGATIVE
Glucose, UA: NEGATIVE mg/dL
KETONES UR: NEGATIVE mg/dL
NITRITE: NEGATIVE
PH: 5.5 (ref 5.0–8.0)
Protein, ur: 100 mg/dL — AB
SPECIFIC GRAVITY, URINE: 1.016 (ref 1.005–1.030)
Urobilinogen, UA: 0.2 mg/dL (ref 0.0–1.0)

## 2014-02-09 LAB — URINE MICROSCOPIC-ADD ON

## 2014-02-09 LAB — GLUCOSE, CAPILLARY: GLUCOSE-CAPILLARY: 121 mg/dL — AB (ref 70–99)

## 2014-02-09 LAB — CBC
HCT: 35.5 % — ABNORMAL LOW (ref 36.0–46.0)
Hemoglobin: 11.2 g/dL — ABNORMAL LOW (ref 12.0–15.0)
MCH: 29.5 pg (ref 26.0–34.0)
MCHC: 31.5 g/dL (ref 30.0–36.0)
MCV: 93.4 fL (ref 78.0–100.0)
Platelets: 284 10*3/uL (ref 150–400)
RBC: 3.8 MIL/uL — ABNORMAL LOW (ref 3.87–5.11)
RDW: 14.1 % (ref 11.5–15.5)
WBC: 18.1 10*3/uL — ABNORMAL HIGH (ref 4.0–10.5)

## 2014-02-09 LAB — BASIC METABOLIC PANEL
Anion gap: 12 (ref 5–15)
BUN: 27 mg/dL — ABNORMAL HIGH (ref 6–23)
CO2: 23 mmol/L (ref 19–32)
Calcium: 8.6 mg/dL (ref 8.4–10.5)
Chloride: 104 mEq/L (ref 96–112)
Creatinine, Ser: 1.25 mg/dL — ABNORMAL HIGH (ref 0.50–1.10)
GFR calc Af Amer: 42 mL/min — ABNORMAL LOW (ref >90)
GFR calc non Af Amer: 36 mL/min — ABNORMAL LOW (ref >90)
Glucose, Bld: 151 mg/dL — ABNORMAL HIGH (ref 70–99)
Potassium: 3.7 mmol/L (ref 3.5–5.1)
Sodium: 139 mmol/L (ref 135–145)

## 2014-02-09 LAB — LACTIC ACID, PLASMA: LACTIC ACID, VENOUS: 3.1 mmol/L — AB (ref 0.5–2.2)

## 2014-02-09 MED ORDER — ENSURE PUDDING PO PUDG
1.0000 | Freq: Three times a day (TID) | ORAL | Status: DC
  Administered 2014-02-09 – 2014-02-18 (×24): 1 via ORAL

## 2014-02-09 MED ORDER — RESOURCE THICKENUP CLEAR PO POWD
ORAL | Status: DC | PRN
  Filled 2014-02-09 (×2): qty 125

## 2014-02-09 NOTE — Progress Notes (Signed)
Speech Language Pathology Treatment: Dysphagia  Patient Details Name: [name] MRN: [id number] DOB: 1919-12-11 Today's Date: 02/09/2014 Time: [id number] SLP Time Calculation (min) (ACUTE ONLY): 31 min  Assessment / Plan / Recommendation Clinical Impression  Dysphagia intervention during breakfast. Pt required moderate feeding assist for scooping and directing to oral cavity. Mild decreased oral control with minimal leakage with increased risk for premature spill with thins. Work of breathing increased (mildly). Verbal cues for small sips provided. Today's CXR showed no active disease, however given observations and history of COPD, recommend downgrade liquids to nectar and continue Dys 1.   HPI HPI: 81 y.o. female with past medical history of hypothyroidism, hypertension, GERD, dementia, COPD, hx of GIB, who presents with syncope. CXR 12/12 No active disease.   Pertinent Vitals Pain Assessment: No/denies pain  SLP Plan  Continue with current plan of care    Recommendations Diet recommendations: Dysphagia 1 (puree);Nectar-thick liquid Liquids provided via: Cup;No straw Medication Administration: Whole meds with puree Supervision: Full supervision/cueing for compensatory strategies Compensations: Slow rate;Small sips/bites;Check for pocketing Postural Changes and/or Swallow Maneuvers: Seated upright 90 degrees;Upright 30-60 min after meal              Oral Care Recommendations: Oral care BID Follow up Recommendations:  (TBD) Plan: Continue with current plan of care    GO     [name], [name] 02/09/2014, 9:02 AM Deanna CoonsLisa Roberson [name] M.Ed [employer]SLP Pager [phone]

## 2014-02-09 NOTE — Clinical Social Work Psychosocial (Signed)
Clinical Social Work Department BRIEF PSYCHOSOCIAL ASSESSMENT 02/09/2014  Patient:  [name],[name]     Account Number:  [id number]     Admit date:  02/07/2014  Clinical Social Worker:  Merlyn LotHOLOMAN,Bianka Liberati, CLINICAL SOCIAL WORKER  Date/Time:  02/09/2014 03:51 PM  Referred by:  Physician  Date Referred:  02/09/2014 Referred for  ALF Placement   Other Referral:   Interview type:  Family Other interview type:   Patients daughter Lawyer(linda) and grandson at bedside    PSYCHOSOCIAL DATA Living Status:  FACILITY Admitted from facility:  Other Level of care:  Assisted Living Primary support name:  [name] Primary support relationship to patient:  CHILD, ADULT Degree of support available:   high level of support- daughter very involved in patient care    CURRENT CONCERNS Current Concerns  Post-Acute Placement   Other Concerns:    SOCIAL WORK ASSESSMENT / PLAN CSW spoke with patients daughter concerning return to memory care unit ([facility]) to discuss return to facility at time of DC.  Patients daughter is agreeable to return if patient is able.   Assessment/plan status:  Psychosocial Support/Ongoing Assessment of Needs Other assessment/ plan:   FL2 update   Information/referral to community resources:    PATIENT'S/FAMILY'S RESPONSE TO PLAN OF CARE: Patients family is agreeable to plan for return to memory care but is concerned about patients health and is worried she may not be able to return.       Merlyn LotJenna Holoman, LCSWA Clinical Social Worker [phone]

## 2014-02-09 NOTE — Progress Notes (Signed)
INITIAL NUTRITION ASSESSMENT  DOCUMENTATION CODES Per approved criteria  -Severe malnutrition in the context of chronic illness  Pt meets criteria for severe MALNUTRITION in the context of chronic illness as evidenced by moderate to severe fat and muscle wasting.  INTERVENTION: - Magic cup TID with meals, each supplement provides 290 kcal and 9 grams of protein - Ensure Pudding po TID, each supplement provides 170 kcal and 4 grams of protein  NUTRITION DIAGNOSIS: Inadequate oral intake related to chronic illness as evidenced by poor po intake.   Goal: Pt to meet >/= 90% of their estimated nutrition needs   Monitor:  Weight trend, po intake, acceptance of supplements, labs  Reason for Assessment: Low Braden Score  81 y.o. female  Admitting Dx: Syncope  ASSESSMENT: 81 y.o. female with past medical history of hypothyroidism, hypertension, GERD, dementia, COPD, hx of GIB, who presents with syncope.  - Pt seen by SLP who recommended a pureed diet with nectar-thickened liquids.  - Pt with UTI and sepsis.  - Pt very hard of hearing.  - Spoke with son and nurse tech who reported that pt ate 100% of her magic cup at breakfast, 2 bites of eggs, and orange juice. Son says that pt normally has a good appetite.  Nutrition Focused Physical Exam:  Subcutaneous Fat:  Orbital Region: moderate wasting Upper Arm Region: mild to moderate wasting Thoracic and Lumbar Region: n/a  Muscle:  Temple Region: moderate to sever wasting Clavicle Bone Region: severe wasting Clavicle and Acromion Bone Region: moderate to severe wasting Scapular Bone Region: n/a Dorsal Hand: moderate wasting Patellar Region: moderate to severe wasting Anterior Thigh Region: moderate wasting Posterior Calf Region: moderate wasting  Edema: none    Height: Ht Readings from Last 1 Encounters:  02/08/14 5' (1.524 m)    Weight: Wt Readings from Last 1 Encounters:  02/08/14 114 lb 13.8 oz (52.1 kg)    Ideal  Body Weight: 45.5 kg  % Ideal Body Weight: 105%  Wt Readings from Last 10 Encounters:  02/08/14 114 lb 13.8 oz (52.1 kg)  06/02/12 109 lb (49.442 kg)  12/31/11 109 lb 9.1 oz (49.7 kg)  10/15/11 111 lb (50.349 kg)  08/19/11 108 lb (48.988 kg)  08/01/11 111 lb 8.8 oz (50.6 kg)  05/23/11 111 lb (50.349 kg)  01/02/11 110 lb (49.896 kg)  07/17/10 116 lb (52.617 kg)  03/06/09 117 lb (53.071 kg)    BMI:  Body mass index is 22.43 kg/(m^2).  Estimated Nutritional Needs: Kcal: 1300-1500 Protein: 65-75 g Fluid: 1.5 L/day  Skin: Intact  Diet Order: DIET - DYS 1  EDUCATION NEEDS: -No education needs identified at this time   Intake/Output Summary (Last 24 hours) at 02/09/14 1041 Last data filed at 02/09/14 0919  Gross per 24 hour  Intake 3246.67 ml  Output      0 ml  Net 3246.67 ml    Last BM: prior to admission   Labs:   Recent Labs Lab 02/08/14 0036 02/08/14 0359 02/09/14 0230  NA 139 139 139  K 4.0 3.8 3.7  CL 105 104 104  CO2  --  26 23  BUN 30* 27* 27*  CREATININE 1.10 1.05 1.25*  CALCIUM  --  9.0 8.6  MG  --  2.2  --   GLUCOSE 136* 139* 151*    CBG (last 3)  No results for input(s): GLUCAP in the last 72 hours.  Scheduled Meds: . ceFEPime (MAXIPIME) IV  1 g Intravenous Q24H  . diclofenac  sodium  2 g Topical BID  . docusate sodium  200 mg Oral BID  . feeding supplement (ENSURE COMPLETE)  237 mL Oral BID BM  . ferrous sulfate  325 mg Oral BID WC  . heparin  5,000 Units Subcutaneous 3 times per day  . levothyroxine  75 mcg Oral QAC breakfast  . pantoprazole  40 mg Oral Daily  . sodium chloride  3 mL Intravenous Q12H  . triamcinolone cream  1 application Topical BID    Continuous Infusions: . sodium chloride 100 mL/hr at 02/09/14 1007    Past Medical History  Diagnosis Date  . Unspecified hypothyroidism   . Disorder of bone and cartilage, unspecified   . Chronic airway obstruction, not elsewhere classified   . Unspecified essential  hypertension   . Esophageal reflux   . Other and unspecified hyperlipidemia   . Dementia     Past Surgical History  Procedure Laterality Date  . Cataract extractions      bilateral  . Abdominal hysterectomy      total  . Bilateral salpingoopherectomy    . Rectal polyp removal      No malignancy identified    Emmaline Kluver MS, RD, LDN

## 2014-02-09 NOTE — Progress Notes (Signed)
[facility] PROGRESS NOTE  [name]:[id number] DOB: [birth year] DOA: 02/07/2014   PCP: Illene Regulus, MD   Brief history of present illness;   Patient is a 81 year old female with a hypothyroid, hypertension, GERD, dementia, COPD presented from the skilled nursing facility with syncopal episode. Per EMS, patient passed out at about 11:30 PM when she was sitting on the commode for about 5 minutes.  Workup in the emergency department included a chest x-ray which was interpreted by radiology to have developing cardiomegaly, basilar congestion possible pulmonary edema. Patient was administered IV Lasix. She did not appear volume overloaded however and BNP came back at 73.7. UA positive for UTI, patient was started on IV Rocephin. She also appeared to be dehydrated rather than volume overloaded, was given 500 mL IV bolus, spiking temp of 102.5 and 103.6.    Assessment/Plan: Gram-negative rod Sepsis-  present on admission, evidenced by temperature of 102.5, HR 103, RR 28, source likely UTI  -WBC count worse today, lactic acid worsened to 3.1, creatinine function worse to 1.2, continue the IV fluid hydration  - Blood cultures positive for gram-negative rods, continue IV cefepime. Follow urine cultures - Swallow evaluation done, also has dysphagia, possibility of aspiration, on dysphagia 1 diet  2.  Question congestive heart failure -Initial two-view chest x-ray reported pulmonary vascular congestion, BNP 73.7 acute CHF, labs however showing BNP of 73.7 - 2-D echo showed EF of 60-65%, grade 1 diastolic dysfunction. - Follow I's and O's and daily weights, IV fluids with caution - Chest x-ray this morning with clear lungs  3.  Urinary tract infection. -Patient having a syncopal event at her nursing home, presenting with sepsis -Continue IV cefepime, follow urine culture results (ordered again, needs to be corrected).  4.  Syncope -Likely secondary to underlying infectious  process, dehydration, sepsis  5.  Hypertension.  -BP currently stable, monitor closely   Code Status: DO NOT RESUSCITATE  Family Communication: No family member at the bedside  Disposition Plan: Anticipate discharge to her skilled nursing facility when medically stable   Antibiotics:  Cefepime 1/12  Subjective:  Patient seen and examined,  somewhat confused, denies any pain    Objective:  BP 113/67 mmHg  Pulse 78  Temp(Src) 99.7 F (37.6 C) (Rectal)  Resp 20  Ht 5' (1.524 m)  Wt 52.1 kg (114 lb 13.8 oz)  BMI 22.43 kg/m2  SpO2 98%   Intake/Output Summary (Last 24 hours) at 02/09/14 0958 Last data filed at 02/09/14 0919  Gross per 24 hour  Intake 3483.67 ml  Output      0 ml  Net 3483.67 ml   Filed Weights   02/08/14 0330  Weight: 52.1 kg (114 lb 13.8 oz)    Exam:   General:  Ill-appearing, awake, oriented x2  Cardiovascular: Regular rate and rhythm normal S1-S2  Respiratory: CTAB  Abdomen: Soft nontender nondistended  Ext: no c/c/e  Data Reviewed: Basic Metabolic Panel:  Recent Labs Lab 02/08/14 0036 02/08/14 0359 02/09/14 0230  NA 139 139 139  K 4.0 3.8 3.7  CL 105 104 104  CO2  --  26 23  GLUCOSE 136* 139* 151*  BUN 30* 27* 27*  CREATININE 1.10 1.05 1.25*  CALCIUM  --  9.0 8.6  MG  --  2.2  --    Liver Function Tests: No results for input(s): AST, ALT, ALKPHOS, BILITOT, PROT, ALBUMIN in the last 168 hours. No results for input(s): LIPASE, AMYLASE in the last 168 hours. No  results for input(s): AMMONIA in the last 168 hours. CBC:  Recent Labs Lab 02/08/14 0017 02/08/14 0036 02/08/14 0359 02/09/14 0230  WBC 8.4  --  8.4 18.1*  NEUTROABS 5.8  --   --   --   HGB 10.7* 12.2 10.7* 11.2*  HCT 33.7* 36.0 34.2* 35.5*  MCV 94.7  --  91.9 93.4  PLT 291  --  300 284   Cardiac Enzymes:  Recent Labs Lab 02/08/14 0359 02/08/14 0905 02/08/14 1710  TROPONINI <0.03 <0.03 <0.03   BNP (last 3 results) No results for input(s):  PROBNP in the last 8760 hours. CBG: No results for input(s): GLUCAP in the last 168 hours.  Recent Results (from the past 240 hour(s))  MRSA PCR Screening     Status: None   Collection Time: 02/08/14  6:38 AM  Result Value Ref Range Status   MRSA by PCR NEGATIVE NEGATIVE Final    Comment:        The GeneXpert MRSA Assay (FDA approved for NASAL specimens only), is one component of a comprehensive MRSA colonization surveillance program. It is not intended to diagnose MRSA infection nor to guide or monitor treatment for MRSA infections.   Culture, blood (routine x 2)     Status: None (Preliminary result)   Collection Time: 02/08/14  5:00 PM  Result Value Ref Range Status   Specimen Description BLOOD RIGHT ARM  Final   Special Requests BOTTLES DRAWN AEROBIC AND ANAEROBIC 10CC  Final   Culture   Final    GRAM NEGATIVE RODS Note: Gram Stain Report Called to,Read Back By and Verified With: TATA CARBONE 02/09/14 0945 BY SMITHERSJ Performed at [facility]    Report Status PENDING  Incomplete     Studies: Dg Chest 2 View  02/08/2014   CLINICAL DATA:  Mid chest pain.  EXAM: CHEST  2 VIEW  COMPARISON:  08/06/2012  FINDINGS: Mild cardiomegaly. Tortuosity of the thoracic aorta, similar to prior exam. There is increasing vascular congestion and questionable mild pulmonary edema. Small bilateral pleural effusions have developed. There is a large retrocardiac hiatal hernia. No confluent airspace disease to suggest pneumonia. No pneumothorax. No acute osseous abnormalities. Minimal anterior wedging in the mid thoracic spine.  IMPRESSION: Development of cardiomegaly, small bilateral pleural effusions, vascular congestion and possible pulmonary edema. Findings suggest fluid overload/CHF.   Electronically Signed   By: Rubye OaksMelanie  Ehinger M.D.   On: 02/08/2014 00:13   Dg Chest Port 1 View  02/09/2014   CLINICAL DATA:  CHF  EXAM: PORTABLE CHEST - 1 VIEW  COMPARISON:  02/07/2014  FINDINGS: Pleural  effusions are not visualized on the AP view. Lungs are clear. Normal heart size.  IMPRESSION: No active disease.   Electronically Signed   By: Maryclare BeanArt  Hoss M.D.   On: 02/09/2014 08:16    Scheduled Meds: . ceFEPime (MAXIPIME) IV  1 g Intravenous Q24H  . diclofenac sodium  2 g Topical BID  . docusate sodium  200 mg Oral BID  . feeding supplement (ENSURE COMPLETE)  237 mL Oral BID BM  . ferrous sulfate  325 mg Oral BID WC  . heparin  5,000 Units Subcutaneous 3 times per day  . levothyroxine  75 mcg Oral QAC breakfast  . pantoprazole  40 mg Oral Daily  . sodium chloride  3 mL Intravenous Q12H  . triamcinolone cream  1 application Topical BID   Continuous Infusions: . sodium chloride 100 mL/hr at 02/09/14 0655   Time spent: 25  mins  RAI,RIPUDEEP M.D. [facility] 02/09/2014, 10:11 AM  Pager: [phone]

## 2014-02-09 NOTE — Progress Notes (Signed)
CRITICAL VALUE ALERT  Critical value received:  Gram neg rods Date of notification:  02/09/14  Time of notification:  1010  Critical value read back:  Nurse who received alert:  Evlyn Clinesara A RN  MD notified (1st page):  Dr. Isidoro Donningai  Time of first page:  1010  MD notified (2nd page):  Time of second page:  Responding MD:  Dr. Isidoro Donningai  Time MD responded:  1010

## 2014-02-09 NOTE — Progress Notes (Signed)
Patient family approached me in the hall of SDU regarding concerns over patient temperature increasing (100.5) and questions for nursing regarding medications and antibiotic schedule.  Informed family that I would pass on this information to nurse Lynwood Dawley(Dara) and let her know that they wished to speak with her. Spoke with nurse Dara and informed her as indicated.    [name], PT DPT  [phone]

## 2014-02-10 DIAGNOSIS — A419 Sepsis, unspecified organism: Secondary | ICD-10-CM | POA: Diagnosis present

## 2014-02-10 LAB — BASIC METABOLIC PANEL
Anion gap: 6 (ref 5–15)
BUN: 25 mg/dL — ABNORMAL HIGH (ref 6–23)
CO2: 22 mmol/L (ref 19–32)
Calcium: 7.9 mg/dL — ABNORMAL LOW (ref 8.4–10.5)
Chloride: 117 mEq/L — ABNORMAL HIGH (ref 96–112)
Creatinine, Ser: 0.87 mg/dL (ref 0.50–1.10)
GFR calc Af Amer: 65 mL/min — ABNORMAL LOW (ref >90)
GFR calc non Af Amer: 56 mL/min — ABNORMAL LOW (ref >90)
Glucose, Bld: 126 mg/dL — ABNORMAL HIGH (ref 70–99)
Potassium: 2.8 mmol/L — ABNORMAL LOW (ref 3.5–5.1)
Sodium: 145 mmol/L (ref 135–145)

## 2014-02-10 LAB — POTASSIUM: Potassium: 4.6 mmol/L (ref 3.5–5.1)

## 2014-02-10 LAB — CBC
HEMATOCRIT: 31.1 % — AB (ref 36.0–46.0)
HEMOGLOBIN: 9.7 g/dL — AB (ref 12.0–15.0)
MCH: 29 pg (ref 26.0–34.0)
MCHC: 31.2 g/dL (ref 30.0–36.0)
MCV: 93.1 fL (ref 78.0–100.0)
PLATELETS: 267 10*3/uL (ref 150–400)
RBC: 3.34 MIL/uL — ABNORMAL LOW (ref 3.87–5.11)
RDW: 14.1 % (ref 11.5–15.5)
WBC: 15 10*3/uL — ABNORMAL HIGH (ref 4.0–10.5)

## 2014-02-10 LAB — GLUCOSE, CAPILLARY: Glucose-Capillary: 109 mg/dL — ABNORMAL HIGH (ref 70–99)

## 2014-02-10 MED ORDER — POTASSIUM CHLORIDE 10 MEQ/100ML IV SOLN
10.0000 meq | INTRAVENOUS | Status: AC
  Administered 2014-02-10 (×4): 10 meq via INTRAVENOUS
  Filled 2014-02-10 (×5): qty 100

## 2014-02-10 MED ORDER — DOCUSATE SODIUM 50 MG/5ML PO LIQD
200.0000 mg | Freq: Two times a day (BID) | ORAL | Status: DC
  Administered 2014-02-10 – 2014-02-16 (×10): 200 mg via ORAL
  Filled 2014-02-10 (×19): qty 20

## 2014-02-10 MED ORDER — POTASSIUM CHLORIDE CRYS ER 20 MEQ PO TBCR
40.0000 meq | EXTENDED_RELEASE_TABLET | ORAL | Status: AC
  Administered 2014-02-10 (×2): 40 meq via ORAL
  Filled 2014-02-10 (×2): qty 2

## 2014-02-10 MED ORDER — HYDRALAZINE HCL 20 MG/ML IJ SOLN
10.0000 mg | Freq: Four times a day (QID) | INTRAMUSCULAR | Status: DC | PRN
  Administered 2014-02-10 – 2014-02-11 (×2): 10 mg via INTRAVENOUS
  Filled 2014-02-10 (×2): qty 1

## 2014-02-10 NOTE — Progress Notes (Signed)
Dear Doctor: Deanna ShipperaiThis patient has been identified as a candidate for PICC for the following reason (s): drug pH or osmolality (causing phlebitis, infiltration in 24 hours) If you agree, please write an order for the indicated device. For any questions contact the Vascular Access Team at [phone] if no answer, please leave a message.  Thank you for supporting the early vascular access assessment program.

## 2014-02-10 NOTE — Progress Notes (Signed)
Speech Language Pathology Treatment: Dysphagia  Patient Details Name: [name] MRN: [id number] DOB: 29-Mar-1919 Today's Date: 02/10/2014 Time: 1246-1300 SLP Time Calculation (min) (ACUTE ONLY): 14 min  Assessment / Plan / Recommendation Clinical Impression  Pt appears to be tolerating downgraded diet of Dysphagia 1, nectar-thick liquids - required max assist for self-feeding; but consumed POs with no s/s of aspiration.  Lungs are improving per chart review.  Recommend continuing current diet - will likely be able to liberalize diet as condition improves.    HPI HPI: 81 y.o. female with past medical history of hypothyroidism, hypertension, GERD, dementia, COPD, hx of GIB, who presents with syncope. CXR 12/12 No active disease.   Pertinent Vitals    SLP Plan  Continue with current plan of care    Recommendations Diet recommendations: Dysphagia 1 (puree);Nectar-thick liquid Liquids provided via: Cup;No straw Medication Administration: Whole meds with puree Supervision: Full supervision/cueing for compensatory strategies Compensations: Slow rate;Small sips/bites;Check for pocketing Postural Changes and/or Swallow Maneuvers: Seated upright 90 degrees;Upright 30-60 min after meal              Oral Care Recommendations: Oral care BID Plan: Continue with current plan of care   [name], [place] CCC/SLP Pager [phone]      [name], [name] 02/10/2014, 1:35 PM

## 2014-02-10 NOTE — Progress Notes (Signed)
[facility] PROGRESS NOTE  [name]:[id number] DOB: 06-14-1919 DOA: 02/07/2014   PCP: Illene Regulus, MD   Brief history of present illness;   Patient is a 81 year old female with a hypothyroid, hypertension, GERD, dementia, COPD presented from the skilled nursing facility with syncopal episode. Per EMS, patient passed out at about 11:30 PM when she was sitting on the commode for about 5 minutes.  Workup in the emergency department included a chest x-ray which was interpreted by radiology to have developing cardiomegaly, basilar congestion possible pulmonary edema. Patient was administered IV Lasix. She did not appear volume overloaded however and BNP came back at 73.7. UA positive for UTI, patient was started on IV Rocephin. She also appeared to be dehydrated rather than volume overloaded, was given 500 mL IV bolus, spiking temp of 102.5 and 103.6.    Assessment/Plan: Gram-negative rod Sepsis-  present on admission, evidenced by temperature of 102.5, HR 103, RR 28, source likely UTI  - WBC count improving, appears to be retaining fluid now, discontinue IV fluids  - Blood cultures positive for gram-negative rods, continue IV cefepime. Follow urine cultures - Swallow evaluation done, also has dysphagia, possibility of aspiration, on dysphagia 1 diet  Question congestive heart failure -Initial chest x-ray reported pulmonary vascular congestion, BNP 73.7 acute CHF, labs however showing BNP of 73.7. 2-D echo showed EF of 60-65%, grade 1 diastolic dysfunction. - Appears to be retaining fluid now, DC IV fluids - Chest x-ray this morning with clear lungs   Urinary tract infection. -Patient having a syncopal event at her nursing home, presenting with sepsis -Continue IV cefepime, follow urine culture results (ordered again)   Syncope -Likely secondary to underlying infectious process, dehydration, sepsis   Hypertension.  -BP currently stable, monitor  closely  Hypokalemia - likely due to lasix, replaced   Code Status: DO NOT RESUSCITATE  Family Communication: Discussed in detail with patient's daughter on the phone, Ms. Josephina Gip  Disposition Plan: Anticipate discharge to her skilled nursing facility when medically stable   Antibiotics:  Cefepime 1/12  Subjective:  Patient seen and examined,  somewhat confused, asking for [employer], no fevers or chills this morning however spiked fever over night around midnight    Objective:  BP 143/110 mmHg  Pulse 72  Temp(Src) 99.5 F (37.5 C) (Oral)  Resp 24  Ht 5' (1.524 m)  Wt 55.8 kg (123 lb 0.3 oz)  BMI 24.03 kg/m2  SpO2 94%   Intake/Output Summary (Last 24 hours) at 02/10/14 0941 Last data filed at 02/10/14 0600  Gross per 24 hour  Intake   3623 ml  Output    500 ml  Net   3123 ml   Filed Weights   02/08/14 0330 02/10/14 0500  Weight: 52.1 kg (114 lb 13.8 oz) 55.8 kg (123 lb 0.3 oz)    Exam:   General:  Ill-appearing, awake, somewhat confused  Cardiovascular: Regular rate and rhythm normal S1-S2  Respiratory: CTAB  Abdomen: Soft nontender nondistended  Ext: no c/c/e  Data Reviewed: Basic Metabolic Panel:  Recent Labs Lab 02/08/14 0036 02/08/14 0359 02/09/14 0230 02/10/14 0236  NA 139 139 139 145  K 4.0 3.8 3.7 2.8*  CL 105 104 104 117*  CO2  --  GLUCOSE 136* 139* 151* 126*  BUN 30* 27* 27* 25*  CREATININE 1.10 1.05 1.25* 0.87  CALCIUM  --  9.0 8.6 7.9*  MG  --  2.2  --   --  Liver Function Tests: No results for input(s): AST, ALT, ALKPHOS, BILITOT, PROT, ALBUMIN in the last 168 hours. No results for input(s): LIPASE, AMYLASE in the last 168 hours. No results for input(s): AMMONIA in the last 168 hours. CBC:  Recent Labs Lab 02/08/14 0017 02/08/14 0036 02/08/14 0359 02/09/14 0230 02/10/14 0236  WBC 8.4  --  8.4 18.1* 15.0*  NEUTROABS 5.8  --   --   --   --   HGB 10.7* 12.2 10.7* 11.2* 9.7*  HCT 33.7* 36.0 34.2*  35.5* 31.1*  MCV 94.7  --  91.9 93.4 93.1  PLT 291  --  300 284 267   Cardiac Enzymes:  Recent Labs Lab 02/08/14 0359 02/08/14 0905 02/08/14 1710  TROPONINI <0.03 <0.03 <0.03   BNP (last 3 results) No results for input(s): PROBNP in the last 8760 hours. CBG:  Recent Labs Lab 02/09/14 0902 02/10/14 0936  GLUCAP 121* 109*    Recent Results (from the past 240 hour(s))  MRSA PCR Screening     Status: None   Collection Time: 02/08/14  6:38 AM  Result Value Ref Range Status   MRSA by PCR NEGATIVE NEGATIVE Final    Comment:        The GeneXpert MRSA Assay (FDA approved for NASAL specimens only), is one component of a comprehensive MRSA colonization surveillance program. It is not intended to diagnose MRSA infection nor to guide or monitor treatment for MRSA infections.   Culture, blood (routine x 2)     Status: None (Preliminary result)   Collection Time: 02/08/14  5:00 PM  Result Value Ref Range Status   Specimen Description BLOOD RIGHT ARM  Final   Special Requests BOTTLES DRAWN AEROBIC AND ANAEROBIC 10CC  Final   Culture   Final    GRAM NEGATIVE RODS Note: Gram Stain Report Called to,Read Back By and Verified With: TATA CARBONE 02/09/14 0945 BY SMITHERSJ Performed at [facility]    Report Status PENDING  Incomplete  Culture, blood (routine x 2)     Status: None (Preliminary result)   Collection Time: 02/08/14  5:10 PM  Result Value Ref Range Status   Specimen Description BLOOD RIGHT ARM  Final   Special Requests BOTTLES DRAWN AEROBIC ONLY 10CC  Final   Culture   Final           BLOOD CULTURE RECEIVED NO GROWTH TO DATE CULTURE WILL BE HELD FOR 5 DAYS BEFORE ISSUING A FINAL NEGATIVE REPORT Performed at [facility]    Report Status PENDING  Incomplete     Studies: Dg Chest Port 1 View  02/09/2014   CLINICAL DATA:  CHF  EXAM: PORTABLE CHEST - 1 VIEW  COMPARISON:  02/07/2014  FINDINGS: Pleural effusions are not visualized on the AP view. Lungs  are clear. Normal heart size.  IMPRESSION: No active disease.   Electronically Signed   By: Maryclare Bean M.D.   On: 02/09/2014 08:16    Scheduled Meds: . ceFEPime (MAXIPIME) IV  1 g Intravenous Q24H  . diclofenac sodium  2 g Topical BID  . docusate sodium  200 mg Oral BID  . feeding supplement (ENSURE)  1 Container Oral TID BM  . ferrous sulfate  325 mg Oral BID WC  . heparin  5,000 Units Subcutaneous 3 times per day  . levothyroxine  75 mcg Oral QAC breakfast  . pantoprazole  40 mg Oral Daily  . potassium chloride  10 mEq Intravenous Q1 Hr x 4  .  potassium chloride  40 mEq Oral Q4H  . sodium chloride  3 mL Intravenous Q12H  . triamcinolone cream  1 application Topical BID   Continuous Infusions: . sodium chloride 100 mL/hr at 02/10/14 0206   Time spent: 25 mins  Rianna Lukes M.D. [facility] 02/10/2014, 9:41 AM  Pager: [phone]

## 2014-02-10 NOTE — Progress Notes (Signed)
Pt had a blood pressure of 176/72 @ 2310, 10 mg of hydralazine was given and after 5 minutes blood pressure is now 154/69.

## 2014-02-10 NOTE — Progress Notes (Signed)
02/10/2014 Patient blood pressure was up in the 190's, Dr Isidoro Donningai was notified at 1840 orders were given for Hydralazine 10 mg every 6 hours prn for systolic blood pressure above 161165. Patient blood pressure was rechecked it was 162/95. Continue to monitor patient. [facility] HospitalNadine Tattiana Fakhouri RN.

## 2014-02-10 NOTE — Progress Notes (Signed)
Physical Therapy Treatment Patient Details Name: [name] MRN: [id number] DOB: Mar 11, 1919 Today's Date: 02/10/2014    History of Present Illness 81 y.o. female with past medical history of hypothyroidism, hypertension, GERD, dementia, COPD, hx of GIB, who presents with syncope.  During admission, patient has been spiking fevers.    PT Comments    Patient recieved tangled in lines and saturated in urine and stool.  Patient hygiene and pericare performed, increased time and assit requried secondary to patient dementia and posture (flexed fetal position). After hygiene, patient gowned and assisted to EOB. Patient tolerated sitting EOB >10 minutes with attemtps at various ther-ex but patient unable to perform secondary to cognitive impairments. patient was pleasently confused throughout session. VS assessed in various positions, Elevated BP 190s to 200s systolic assessed x4 to confirm accuracy, nursing notified.  Patient assisted back to bed secondary to need for cooling blanket. Nursing notified of need to reset cooling settings.   Spoke with grandson at length regarding session and functional status. Will continue to see as indicated and progress as tolerated. Goal for OOB to chair once cooling blanket not needed.   Follow Up Recommendations  SNF;Supervision/Assistance - 24 hour (return to memory care unit)     Equipment Recommendations  None recommended by PT    Recommendations for Other Services       Precautions / Restrictions Precautions Precautions: Fall Restrictions Weight Bearing Restrictions: No    Mobility  Bed Mobility Overal bed mobility: Needs Assistance Bed Mobility: Supine to Sit;Sit to Supine     Supine to sit: Mod assist Sit to supine: Mod assist   General bed mobility comments: Increased assist to break fetal position. Once transistion to EOB initiated patient was able to assist to some degree. Assist with UB/LB secondary to dementia and cog  impairements, difficulty following commands  Transfers Overall transfer level: Needs assistance Equipment used: Rolling walker (2 wheeled) Transfers: Sit to/from Stand Sit to Stand: Max assist         General transfer comment: patient with flexed posture and increased time to bring hand to place on RW, able to power up but maintain fwd flexed posture and required assist for stability  Ambulation/Gait             General Gait Details: attempted steps, unable to perform, LE buckling and increased weakness.   Stairs            Wheelchair Mobility    Modified Rankin (Stroke Patients Only)       Balance     Sitting balance-Leahy Scale: Fair       Standing balance-Leahy Scale: Fair                      Cognition Arousal/Alertness: Awake/alert Behavior During Therapy: Restless;Anxious Overall Cognitive Status: History of cognitive impairments - at baseline (Pt grandson reports that pt is at baseline level of confusion secondary to dementia)       Memory: Decreased short-term memory;Decreased recall of precautions              Exercises      General Comments General comments (skin integrity, edema, etc.): patient recieved tangled in lines and saturated in urine and stool.  Patient hygiene and pericare performed, increased time and assit requried secondary to patient dementia and posture (flexed fetal position). After hygiene, patient gowned and assisted to EOB. Patient tolerated sitting EOB >10 minutes with attemtps at various ther-ex but patient unable to perform secondary  to cognitive impairments. patient was pleasently confused throughout session. VS assessed in various positions, Elevated BP 190s to 200s systolic assessed x4 to confirm accuracy, nursing notified.  Patient assisted back to bed secondary to need for cooling blanket. Nursing notified of need to reset cooling settings.        Pertinent Vitals/Pain Pain Assessment: No/denies pain     Home Living                      Prior Function            PT Goals (current goals can now be found in the care plan section) Acute Rehab PT Goals Patient Stated Goal: goal to return to memory care unit PT Goal Formulation: With family Time For Goal Achievement: 02/22/14 Potential to Achieve Goals: Fair Progress towards PT goals: Progressing toward goals    Frequency  Min 3X/week    PT Plan Current plan remains appropriate    Co-evaluation             End of Session   Activity Tolerance: Patient limited by fatigue Patient left: in bed;with call bell/phone within reach;with bed alarm set;with family/visitor present (cooling blanket in place)     Time: 1610-9604 PT Time Calculation (min) (ACUTE ONLY): 42 min  Charges:                       G CodesFabio Asa 24-Feb-2014, 5:56 PM Charlotte Crumb, PT DPT  [phone]

## 2014-02-11 LAB — BASIC METABOLIC PANEL
ANION GAP: 9 (ref 5–15)
BUN: 19 mg/dL (ref 6–23)
CHLORIDE: 109 meq/L (ref 96–112)
CO2: 26 mmol/L (ref 19–32)
Calcium: 9.5 mg/dL (ref 8.4–10.5)
Creatinine, Ser: 0.8 mg/dL (ref 0.50–1.10)
GFR calc non Af Amer: 62 mL/min — ABNORMAL LOW (ref >90)
GFR, EST AFRICAN AMERICAN: 72 mL/min — AB (ref >90)
Glucose, Bld: 134 mg/dL — ABNORMAL HIGH (ref 70–99)
POTASSIUM: 3.9 mmol/L (ref 3.5–5.1)
SODIUM: 144 mmol/L (ref 135–145)

## 2014-02-11 LAB — CBC
HEMATOCRIT: 37 % (ref 36.0–46.0)
Hemoglobin: 11.5 g/dL — ABNORMAL LOW (ref 12.0–15.0)
MCH: 28.9 pg (ref 26.0–34.0)
MCHC: 31.1 g/dL (ref 30.0–36.0)
MCV: 93 fL (ref 78.0–100.0)
Platelets: 315 10*3/uL (ref 150–400)
RBC: 3.98 MIL/uL (ref 3.87–5.11)
RDW: 13.9 % (ref 11.5–15.5)
WBC: 14.1 10*3/uL — ABNORMAL HIGH (ref 4.0–10.5)

## 2014-02-11 LAB — URINE CULTURE: Colony Count: 6000

## 2014-02-11 LAB — CULTURE, BLOOD (ROUTINE X 2)

## 2014-02-11 MED ORDER — ENALAPRIL MALEATE 5 MG PO TABS
5.0000 mg | ORAL_TABLET | Freq: Every day | ORAL | Status: DC
  Administered 2014-02-11 – 2014-02-18 (×8): 5 mg via ORAL
  Filled 2014-02-11 (×8): qty 1

## 2014-02-11 MED ORDER — METOPROLOL TARTRATE 25 MG PO TABS
25.0000 mg | ORAL_TABLET | Freq: Two times a day (BID) | ORAL | Status: DC
  Administered 2014-02-11 (×2): 25 mg via ORAL
  Filled 2014-02-11 (×4): qty 1

## 2014-02-11 NOTE — Progress Notes (Signed)
[facility] PROGRESS NOTE  [name]:[id number] DOB: 1919/12/10 DOA: 02/07/2014   PCP: Illene Regulus, MD   Brief history of present illness;   Patient is a 81 year old female with a hypothyroid, hypertension, GERD, dementia, COPD presented from the skilled nursing facility with syncopal episode. Per EMS, patient passed out at about 11:30 PM when she was sitting on the commode for about 5 minutes.  Workup in the emergency department included a chest x-ray which was interpreted by radiology to have developing cardiomegaly, basilar congestion possible pulmonary edema. Patient was administered IV Lasix. She did not appear volume overloaded however and BNP came back at 73.7. UA positive for UTI, patient was started on IV Rocephin. She also appeared to be dehydrated rather than volume overloaded, was given 500 mL IV bolus, spiking temp of 102.5 and 103.6.    Assessment/Plan: Serratia marcescens Sepsis likely due to UTI-  present on admission, evidenced by temperature of 102.5, HR 103, RR 28, source likely UTI  - WBC count improving. Urine culture showed no growth however urine culture was not done prior to antibiotics at the time of admission. - Patient will need oral antibiotics for at least 2 weeks, likely ciprofloxacin, sensitive on blood cultures  - Swallow evaluation done, also has dysphagia, possibility of aspiration, on dysphagia 1 diet  History of chronic diastolic congestive heart failure -Initial chest x-ray reported pulmonary vascular congestion, BNP 73.7 acute CHF, labs however showing BNP of 73.7. 2-D echo showed EF of 60-65%, grade 1 diastolic dysfunction. -IV fluids discontinued   Urinary tract infection. -Patient having a syncopal event at her nursing home, presenting with sepsis -Continue IV cefepime, urine culture showed no significant growth, only 6000 colonies   Syncope -Likely secondary to underlying infectious process, dehydration, sepsis   Hypertension.  -BP currently stable, monitor closely  Hypokalemia - likely due to lasix, replaced   Acute encephalopathy superimposed on dementia: Currently in memory care unit - Likely worsened due to #1, UTI, significantly improved, currently close to her baseline, confirmed per grandson at the bedside  Code Status: DO NOT RESUSCITATE  Family Communication: Discussed in detail with patient's grandson, [name] at bedside  Disposition Plan: Anticipate discharge to her skilled nursing facility when medically stable   Antibiotics:  Cefepime 1/12  Subjective:  Patient seen and examined, alert and pleasant, communicating, close to her baseline mental status per the grandson at the bedside    Objective:  BP 163/73 mmHg  Pulse 97  Temp(Src) 98 F (36.7 C) (Axillary)  Resp 21  Ht 5' (1.524 m)  Wt 55.9 kg (123 lb 3.8 oz)  BMI 24.07 kg/m2  SpO2 97%   Intake/Output Summary (Last 24 hours) at 02/11/14 1047 Last data filed at 02/11/14 0935  Gross per 24 hour  Intake    150 ml  Output      0 ml  Net    150 ml   Filed Weights   02/08/14 0330 02/10/14 0500 02/11/14 0500  Weight: 52.1 kg (114 lb 13.8 oz) 55.8 kg (123 lb 0.3 oz) 55.9 kg (123 lb 3.8 oz)    Exam:   General: Alert and oriented x2, close to her baseline  Cardiovascular: Regular rate and rhythm normal S1-S2  Respiratory: CTAB  Abdomen: Soft nontender nondistended  Ext: no c/c/e  Data Reviewed: Basic Metabolic Panel:  Recent Labs Lab 02/08/14 0036 02/08/14 0359 02/09/14 0230 02/10/14 0236 02/10/14 1725 02/11/14 0426  NA 139 139 139 145  --  144  K 4.0  3.8 3.7 2.8* 4.6 3.9  CL 105 104 104 117*  --  109  CO2  --  --  26  GLUCOSE 136* 139* 151* 126*  --  134*  BUN 30* 27* 27* 25*  --  19  CREATININE 1.10 1.05 1.25* 0.87  --  0.80  CALCIUM  --  9.0 8.6 7.9*  --  9.5  MG  --  2.2  --   --   --   --    Liver Function Tests: No results for input(s): AST, ALT, ALKPHOS, BILITOT, PROT,  ALBUMIN in the last 168 hours. No results for input(s): LIPASE, AMYLASE in the last 168 hours. No results for input(s): AMMONIA in the last 168 hours. CBC:  Recent Labs Lab 02/08/14 0017 02/08/14 0036 02/08/14 0359 02/09/14 0230 02/10/14 0236 02/11/14 0426  WBC 8.4  --  8.4 18.1* 15.0* 14.1*  NEUTROABS 5.8  --   --   --   --   --   HGB 10.7* 12.2 10.7* 11.2* 9.7* 11.5*  HCT 33.7* 36.0 34.2* 35.5* 31.1* 37.0  MCV 94.7  --  91.9 93.4 93.1 93.0  PLT 291  --  300 284 267 315   Cardiac Enzymes:  Recent Labs Lab 02/08/14 0359 02/08/14 0905 02/08/14 1710  TROPONINI <0.03 <0.03 <0.03   BNP (last 3 results) No results for input(s): PROBNP in the last 8760 hours. CBG:  Recent Labs Lab 02/09/14 0902 02/10/14 0936  GLUCAP 121* 109*    Recent Results (from the past 240 hour(s))  MRSA PCR Screening     Status: None   Collection Time: 02/08/14  6:38 AM  Result Value Ref Range Status   MRSA by PCR NEGATIVE NEGATIVE Final    Comment:        The GeneXpert MRSA Assay (FDA approved for NASAL specimens only), is one component of a comprehensive MRSA colonization surveillance program. It is not intended to diagnose MRSA infection nor to guide or monitor treatment for MRSA infections.   Culture, blood (routine x 2)     Status: None   Collection Time: 02/08/14  5:00 PM  Result Value Ref Range Status   Specimen Description BLOOD RIGHT ARM  Final   Special Requests BOTTLES DRAWN AEROBIC AND ANAEROBIC 10CC  Final   Culture   Final    SERRATIA MARCESCENS Note: Gram Stain Report Called to,Read Back By and Verified With: TATA CARBONE 02/09/14 0945 BY SMITHERSJ Performed at [facility]    Report Status 02/11/2014 FINAL  Final   Organism ID, Bacteria SERRATIA MARCESCENS  Final      Susceptibility   Serratia marcescens - MIC*    CEFAZOLIN >=64 RESISTANT Resistant     CEFEPIME <=1 SENSITIVE Sensitive     CEFTAZIDIME <=1 SENSITIVE Sensitive     CEFTRIAXONE <=1 SENSITIVE  Sensitive     CIPROFLOXACIN <=0.25 SENSITIVE Sensitive     GENTAMICIN <=1 SENSITIVE Sensitive     TOBRAMYCIN 8 INTERMEDIATE Intermediate     TRIMETH/SULFA <=20 SENSITIVE Sensitive     * SERRATIA MARCESCENS  Culture, blood (routine x 2)     Status: None (Preliminary result)   Collection Time: 02/08/14  5:10 PM  Result Value Ref Range Status   Specimen Description BLOOD RIGHT ARM  Final   Special Requests BOTTLES DRAWN AEROBIC ONLY 10CC  Final   Culture   Final           BLOOD CULTURE RECEIVED NO GROWTH TO  DATE CULTURE WILL BE HELD FOR 5 DAYS BEFORE ISSUING A FINAL NEGATIVE REPORT [facility]    Report Status PENDING  Incomplete  Urine culture     Status: None   Collection Time: 02/09/14 10:37 AM  Result Value Ref Range Status   Specimen Description URINE, CATHETERIZED  Final   Special Requests NONE  Final   Colony Count   Final    6,000 COLONIES/ML [facility]    Culture   Final    INSIGNIFICANT GROWTH [facility]formed at [facility]    Report Status 02/11/2014 FINAL  Final     Studies: No results found.  Scheduled Meds: . ceFEPime (MAXIPIME) IV  1 g Intravenous Q24H  . diclofenac sodium  2 g Topical BID  . docusate  200 mg Oral BID  . enalapril  5 mg Oral Daily  . feeding supplement (ENSURE)  1 Container Oral TID BM  . ferrous sulfate  325 mg Oral BID WC  . heparin  5,000 Units Subcutaneous 3 times per day  . levothyroxine  75 mcg Oral QAC breakfast  . metoprolol tartrate  25 mg Oral BID  . pantoprazole  40 mg Oral Daily  . sodium chloride  3 mL Intravenous Q12H  . triamcinolone cream  1 application Topical BID   Continuous Infusions:   Time spent: 25 mins  Robi Mitter M.D. [facility] 02/11/2014, 10:47 AM  Pager: [phone]

## 2014-02-11 NOTE — Progress Notes (Signed)
ANTIBIOTIC CONSULT NOTE - Follow Up  Pharmacy Consult for cefepime Indication: sepsis  No Known Allergies  Patient Measurements: Height: 5' (152.4 cm) Weight: 123 lb 3.8 oz (55.9 kg) IBW/kg (Calculated) : 45.5  Vital Signs: Temp: 98.9 F (37.2 C) (01/15 0400) Temp Source: Rectal (01/15 0400) BP: 154/71 mmHg (01/15 0600) Pulse Rate: 97 (01/15 0600) Intake/Output from previous day: 01/14 0701 - 01/15 0700 In: 50 [IV Piggyback:50] Out: -  Intake/Output from this shift:    Labs:  Recent Labs  02/09/14 0230 02/10/14 0236 02/11/14 0426  WBC 18.1* 15.0* 14.1*  HGB 11.2* 9.7* 11.5*  PLT 284 267 315  CREATININE 1.25* 0.87 0.80   Estimated Creatinine Clearance: 35.2 mL/min (by C-G formula based on Cr of 0.8). No results for input(s): VANCOTROUGH, VANCOPEAK, VANCORANDOM, GENTTROUGH, GENTPEAK, GENTRANDOM, TOBRATROUGH, TOBRAPEAK, TOBRARND, AMIKACINPEAK, AMIKACINTROU, AMIKACIN in the last 72 hours.   Microbiology: Recent Results (from the past 720 hour(s))  MRSA PCR Screening     Status: None   Collection Time: 02/08/14  6:38 AM  Result Value Ref Range Status   MRSA by PCR NEGATIVE NEGATIVE Final    Comment:        The GeneXpert MRSA Assay (FDA approved for NASAL specimens only), is one component of a comprehensive MRSA colonization surveillance program. It is not intended to diagnose MRSA infection nor to guide or monitor treatment for MRSA infections.   Culture, blood (routine x 2)     Status: None (Preliminary result)   Collection Time: 02/08/14  5:00 PM  Result Value Ref Range Status   Specimen Description BLOOD RIGHT ARM  Final   Special Requests BOTTLES DRAWN AEROBIC AND ANAEROBIC 10CC  Final   Culture   Final    GRAM NEGATIVE RODS Note: Gram Stain Report Called to,Read Back By and Verified With: TATA CARBONE 02/09/14 0945 BY SMITHERSJ Performed at [facility]    Report Status PENDING  Incomplete  Culture, blood (routine x 2)     Status: None  (Preliminary result)   Collection Time: 02/08/14  5:10 PM  Result Value Ref Range Status   Specimen Description BLOOD RIGHT ARM  Final   Special Requests BOTTLES DRAWN AEROBIC ONLY 10CC  Final   Culture   Final           BLOOD CULTURE RECEIVED NO GROWTH TO DATE CULTURE WILL BE HELD FOR 5 DAYS BEFORE ISSUING A FINAL NEGATIVE REPORT Performed at [facility]    Report Status PENDING  Incomplete  Urine culture     Status: None   Collection Time: 02/09/14 10:37 AM  Result Value Ref Range Status   Specimen Description URINE, CATHETERIZED  Final   Special Requests NONE  Final   Colony Count   Final    6,000 COLONIES/ML Performed at [facility]    Culture   Final    INSIGNIFICANT GROWTH Performed at [facility]    Report Status 02/11/2014 FINAL  Final    Medical History: Past Medical History  Diagnosis Date  . Unspecified hypothyroidism   . Disorder of bone and cartilage, unspecified   . Chronic airway obstruction, not elsewhere classified   . Unspecified essential hypertension   . Esophageal reflux   . Other and unspecified hyperlipidemia   . Dementia     Medications:  Prescriptions prior to admission  Medication Sig Dispense Refill Last Dose  . acetaminophen (TYLENOL) 325 MG tablet Take 325 mg by mouth 2 (two) times daily.  02/07/2014 at Unknown time  . acetaminophen (TYLENOL) 325 MG tablet Take 650 mg by mouth every 6 (six) hours as needed for mild pain.    unknown  . clotrimazole (LOTRIMIN) 1 % cream Apply 1 application topically 2 (two) times daily as needed (to affected skin).    unknown  . Cranberry 250 MG CAPS Take 500 mg by mouth 2 (two) times daily.   02/07/2014 at Unknown time  . diclofenac sodium (VOLTAREN) 1 % GEL Apply 2 g topically 2 (two) times daily. Applied to right shoulder   02/07/2014 at Unknown time  . docusate (COLACE) 50 MG/5ML liquid Take 200 mg by mouth 2 (two) times daily.   02/07/2014 at Unknown time  . enalapril (VASOTEC) 2.5  MG tablet Take 2.5 mg by mouth daily.   02/07/2014 at Unknown time  . ferrous sulfate 325 (65 FE) MG tablet Take 325 mg by mouth 2 (two) times daily with a meal.    02/07/2014 at Unknown time  . HYDROcodone-acetaminophen (NORCO/VICODIN) 5-325 MG per tablet Take 1 tablet by mouth every 6 (six) hours as needed for moderate pain or severe pain. 10 tablet 0 unknown  . levothyroxine (SYNTHROID, LEVOTHROID) 75 MCG tablet Take 75 mcg by mouth daily before breakfast.   02/07/2014 at Unknown time  . Nutritional Supplements (NUTRITIONAL DRINK PO) Take 1 Container by mouth 2 (two) times daily. House shakes   02/07/2014 at Unknown time  . ondansetron (ZOFRAN) 4 MG tablet Take 4 mg by mouth every 6 (six) hours as needed for nausea or vomiting.   unknown  . OVER THE COUNTER MEDICATION Take 1 tablet by mouth daily. Therapy M -   02/07/2014 at Unknown time  . Eyelid Cleansers (OCUSOFT LID SCRUB EX) Apply 1 application topically daily. Use to clean both eyes every day   11/22/2013 at Unknown time  . omeprazole (PRILOSEC) 20 MG capsule Take 20 mg by mouth daily.    11/21/2013 at Unknown time  . triamcinolone cream (KENALOG) 0.1 % Apply 1 application topically 2 (two) times daily. Applied to face, arms, hands and legs.   11/22/2013 at Unknown time   Assessment: 81 year old woman initially started on ceftriaxone then tansitioned to cefepime for sepsis.   1/1 Bcx GNR - follow up for sensitivtites, Ucx in process WBC improved 18 > 14  - no steroids BP, HR stable on metoprolol, enalapril Her creatinine is 1.05 > 0.8 improved,   Goal of Therapy:  Treat infection  Plan:  cefepime 1g IV q24  Monitor cultures and renal function   Leota Sauers Pharm.D. CPP, BCPS Clinical Pharmacist [phone] 02/11/2014 7:43 AM

## 2014-02-11 NOTE — Progress Notes (Signed)
Physical Therapy Treatment Patient Details Name: [name] MRN: [id number] DOB: [birth year] Today's Date: 02/11/2014    History of Present Illness 81 y.o. female with past medical history of hypothyroidism, hypertension, GERD, dementia, COPD, hx of GIB, who presents with syncope.  During admission, patient has been spiking fevers.    PT Comments    Patient tolerated some EOB activities this session without physical assist. Patient performed sit <> stand x3 with assist required but unwilling to attempt ambulation. Limited within the confines of patient dementia and willingness to participate. Will continue to see and progress as tolerated.   Follow Up Recommendations  SNF;Supervision/Assistance - 24 hour (return to memory care unit)     Equipment Recommendations  None recommended by PT    Recommendations for Other Services       Precautions / Restrictions Precautions Precautions: Fall Restrictions Weight Bearing Restrictions: No    Mobility  Bed Mobility Overal bed mobility: Needs Assistance Bed Mobility: Supine to Sit;Sit to Supine     Supine to sit: Mod assist Sit to supine: Mod assist   General bed mobility comments: Increased assist to break fetal position. Once transistion to EOB initiated patient was able to assist to some degree. Assist with UB/LB secondary to dementia and cog impairements, difficulty following commands  Transfers Overall transfer level: Needs assistance Equipment used: Rolling walker (2 wheeled) Transfers: Sit to/from Stand Sit to Stand: Mod assist;Max assist         General transfer comment: patient able to come to standing with moderate assist using RW for hand placement and elevation, performed x 3, required max assist on 3rd attempt secondary to patient deciding she no longer wanted to stand  Ambulation/Gait             General Gait Details: patient not willing to attempt ambulation   Stairs            Wheelchair  Mobility    Modified Rankin (Stroke Patients Only)       Balance   Sitting-balance support: No upper extremity supported Sitting balance-Leahy Scale: Fair     Standing balance support: Bilateral upper extremity supported Standing balance-Leahy Scale: Poor                      Cognition Arousal/Alertness: Awake/alert Behavior During Therapy: Restless;Anxious Overall Cognitive Status: History of cognitive impairments - at baseline (Pt grandson reports that pt is at baseline level of confusion secondary to dementia)       Memory: Decreased short-term memory;Decreased recall of precautions              Exercises General Exercises - Lower Extremity Ankle Circles/Pumps: AROM;Both;20 reps Long Arc Quad: AROM;Both;Seated (patient performed continuously over a 2 minutes period)    General Comments General comments (skin integrity, edema, etc.): patient performed various tasks sitting EOB including grooming, reaching, LEs ther-ex (limited)       Pertinent Vitals/Pain      Home Living                      Prior Function            PT Goals (current goals can now be found in the care plan section) Acute Rehab PT Goals Patient Stated Goal: goal to return to memory care unit PT Goal Formulation: With family Time For Goal Achievement: 02/22/14 Potential to Achieve Goals: Fair Progress towards PT goals: Progressing toward goals    Frequency  Min  3X/week    PT Plan Current plan remains appropriate    Co-evaluation             End of Session Equipment Utilized During Treatment: Gait belt Activity Tolerance: Patient limited by fatigue Patient left: in bed;with call bell/phone within reach;with bed alarm set;with family/visitor present (cooling blanket in place) May benefit from busy board.     Time: [id number] PT Time Calculation (min) (ACUTE ONLY): 33 min  Charges:  $Therapeutic Activity: 23-37 mins                    G CodesFabio Asa:       [name] 02/11/2014, 5:09 PM Charlotte Crumbevon Catlyn Shipton, PT DPT  [phone]

## 2014-02-11 NOTE — Plan of Care (Signed)
Problem: Consults Goal: Nutrition Consult-if indicated Outcome: Completed/Met Date Met:  02/11/14 Pt is on a dysphagia 1/thickened liquids

## 2014-02-12 DIAGNOSIS — J449 Chronic obstructive pulmonary disease, unspecified: Secondary | ICD-10-CM

## 2014-02-12 LAB — BASIC METABOLIC PANEL
ANION GAP: 8 (ref 5–15)
BUN: 28 mg/dL — ABNORMAL HIGH (ref 6–23)
CHLORIDE: 112 meq/L (ref 96–112)
CO2: 25 mmol/L (ref 19–32)
CREATININE: 0.88 mg/dL (ref 0.50–1.10)
Calcium: 9.4 mg/dL (ref 8.4–10.5)
GFR calc Af Amer: 64 mL/min — ABNORMAL LOW (ref >90)
GFR calc non Af Amer: 55 mL/min — ABNORMAL LOW (ref >90)
Glucose, Bld: 120 mg/dL — ABNORMAL HIGH (ref 70–99)
POTASSIUM: 3.7 mmol/L (ref 3.5–5.1)
SODIUM: 145 mmol/L (ref 135–145)

## 2014-02-12 LAB — CBC
HEMATOCRIT: 32.2 % — AB (ref 36.0–46.0)
Hemoglobin: 9.9 g/dL — ABNORMAL LOW (ref 12.0–15.0)
MCH: 28.6 pg (ref 26.0–34.0)
MCHC: 30.7 g/dL (ref 30.0–36.0)
MCV: 93.1 fL (ref 78.0–100.0)
Platelets: 304 10*3/uL (ref 150–400)
RBC: 3.46 MIL/uL — ABNORMAL LOW (ref 3.87–5.11)
RDW: 14.3 % (ref 11.5–15.5)
WBC: 10.2 10*3/uL (ref 4.0–10.5)

## 2014-02-12 LAB — GLUCOSE, CAPILLARY: GLUCOSE-CAPILLARY: 107 mg/dL — AB (ref 70–99)

## 2014-02-12 MED ORDER — METOPROLOL TARTRATE 50 MG PO TABS
50.0000 mg | ORAL_TABLET | Freq: Two times a day (BID) | ORAL | Status: DC
  Administered 2014-02-12 – 2014-02-18 (×13): 50 mg via ORAL
  Filled 2014-02-12 (×15): qty 1

## 2014-02-12 MED ORDER — IBUPROFEN 400 MG PO TABS
400.0000 mg | ORAL_TABLET | Freq: Once | ORAL | Status: AC
  Administered 2014-02-12: 400 mg via ORAL
  Filled 2014-02-12: qty 1

## 2014-02-12 NOTE — Progress Notes (Signed)
[facility] PROGRESS NOTE  [name]:[id number] DOB: Jun 06, 1919 DOA: 02/07/2014   PCP: Illene Regulus, MD   Brief history of present illness;   Patient is a 81 year old female with a hypothyroid, hypertension, GERD, dementia, COPD presented from the skilled nursing facility with syncopal episode. Per EMS, patient passed out at about 11:30 PM when she was sitting on the commode for about 5 minutes.  Workup in the emergency department included a chest x-ray which was interpreted by radiology to have developing cardiomegaly, basilar congestion possible pulmonary edema. Patient was administered IV Lasix. She did not appear volume overloaded however and BNP came back at 73.7. UA positive for UTI, patient was started on IV Rocephin. She also appeared to be dehydrated rather than volume overloaded, was given 500 mL IV bolus, spiking temp of 102.5 and 103.6.    Assessment/Plan: Serratia marcescens Sepsis likely due to UTI-  present on admission, evidenced by temperature of 102.5, HR 103, RR 28, source likely UTI  - WBC count normalized. Urine culture showed no growth however urine culture was not done prior to antibiotics at the time of admission. - Patient will need oral antibiotics for at least 2 weeks, likely ciprofloxacin, per sensitivities on blood cultures  - Swallow evaluation done, also has dysphagia, possibility of aspiration, on dysphagia 1 diet  History of chronic diastolic congestive heart failure -Initial chest x-ray reported pulmonary vascular congestion, BNP 73.7 acute CHF, labs however showing BNP of 73.7. 2-D echo showed EF of 60-65%, grade 1 diastolic dysfunction. -IV fluids discontinued   Urinary tract infection. -Patient having a syncopal event at her nursing home, presenting with sepsis -Continue IV cefepime, urine culture showed no significant growth, only 6000 colonies   Syncope -Likely secondary to underlying infectious process, dehydration, sepsis   Hypertension.  -BP currently stable, monitor closely  Acute encephalopathy superimposed on dementia: Currently in memory care unit - Likely worsened due to #1, UTI, significantly improved, currently close to her baseline  Code Status: DO NOT RESUSCITATE  Family Communication: Discussed in detail with patient's grandson, [name] at bedside yesterday, no family member at bed side today  Disposition Plan: Anticipate discharge to her skilled nursing facility on Mon   Antibiotics:  Cefepime 1/12  Subjective:  Patient seen and examined, alert and pleasant, no acute issues, afebrile   Objective:  BP 145/55 mmHg  Pulse 64  Temp(Src) 97.8 F (36.6 C) (Oral)  Resp 18  Ht 5' (1.524 m)  Wt 54.9 kg (121 lb 0.5 oz)  BMI 23.64 kg/m2  SpO2 96%   Intake/Output Summary (Last 24 hours) at 02/12/14 1218 Last data filed at 02/11/14 2300  Gross per 24 hour  Intake     50 ml  Output      1 ml  Net     49 ml   Filed Weights   02/10/14 0500 02/11/14 0500 02/12/14 0437  Weight: 55.8 kg (123 lb 0.3 oz) 55.9 kg (123 lb 3.8 oz) 54.9 kg (121 lb 0.5 oz)    Exam:   General: Alert and oriented x2, close to her baseline  Cardiovascular: Regular rate and rhythm normal S1-S2  Respiratory: CTAB  Abdomen: Soft nontender nondistended  Ext: no c/c/e  Data Reviewed: Basic Metabolic Panel:  Recent Labs Lab 02/08/14 0359 02/09/14 0230 02/10/14 0236 02/10/14 1725 02/11/14 0426 02/12/14 0310  NA 139 139 145  --  144 145  K 3.8 3.7 2.8* 4.6 3.9 3.7  CL 104 104 117*  --  109 112  CO2 --  26 25  GLUCOSE 139* 151* 126*  --  134* 120*  BUN 27* 27* 25*  --  19 28*  CREATININE 1.05 1.25* 0.87  --  0.80 0.88  CALCIUM 9.0 8.6 7.9*  --  9.5 9.4  MG 2.2  --   --   --   --   --    Liver Function Tests: No results for input(s): AST, ALT, ALKPHOS, BILITOT, PROT, ALBUMIN in the last 168 hours. No results for input(s): LIPASE, AMYLASE in the last 168 hours. No results for input(s):  AMMONIA in the last 168 hours. CBC:  Recent Labs Lab 02/08/14 0017  02/08/14 0359 02/09/14 0230 02/10/14 0236 02/11/14 0426 02/12/14 0310  WBC 8.4  --  8.4 18.1* 15.0* 14.1* 10.2  NEUTROABS 5.8  --   --   --   --   --   --   HGB 10.7*  < > 10.7* 11.2* 9.7* 11.5* 9.9*  HCT 33.7*  < > 34.2* 35.5* 31.1* 37.0 32.2*  MCV 94.7  --  91.9 93.4 93.1 93.0 93.1  PLT 291  --  300 284 267 315 304  < > = values in this interval not displayed. Cardiac Enzymes:  Recent Labs Lab 02/08/14 0359 02/08/14 0905 02/08/14 1710  TROPONINI <0.03 <0.03 <0.03   BNP (last 3 results) No results for input(s): PROBNP in the last 8760 hours. CBG:  Recent Labs Lab 02/09/14 0902 02/10/14 0936 02/12/14 0818  GLUCAP 121* 109* 107*    Recent Results (from the past 240 hour(s))  MRSA PCR Screening     Status: None   Collection Time: 02/08/14  6:38 AM  Result Value Ref Range Status   MRSA by PCR NEGATIVE NEGATIVE Final    Comment:        The GeneXpert MRSA Assay (FDA approved for NASAL specimens only), is one component of a comprehensive MRSA colonization surveillance program. It is not intended to diagnose MRSA infection nor to guide or monitor treatment for MRSA infections.   Culture, blood (routine x 2)     Status: None   Collection Time: 02/08/14  5:00 PM  Result Value Ref Range Status   Specimen Description BLOOD RIGHT ARM  Final   Special Requests BOTTLES DRAWN AEROBIC AND ANAEROBIC 10CC  Final   Culture   Final    SERRATIA MARCESCENS Note: Gram Stain Report Called to,Read Back By and Verified With: TATA CARBONE 02/09/14 0945 BY SMITHERSJ Performed at [facility]    Report Status 02/11/2014 FINAL  Final   Organism ID, Bacteria SERRATIA MARCESCENS  Final      Susceptibility   Serratia marcescens - MIC*    CEFAZOLIN >=64 RESISTANT Resistant     CEFEPIME <=1 SENSITIVE Sensitive     CEFTAZIDIME <=1 SENSITIVE Sensitive     CEFTRIAXONE <=1 SENSITIVE Sensitive      CIPROFLOXACIN <=0.25 SENSITIVE Sensitive     GENTAMICIN <=1 SENSITIVE Sensitive     TOBRAMYCIN 8 INTERMEDIATE Intermediate     TRIMETH/SULFA <=20 SENSITIVE Sensitive     * SERRATIA MARCESCENS  Culture, blood (routine x 2)     Status: None (Preliminary result)   Collection Time: 02/08/14  5:10 PM  Result Value Ref Range Status   Specimen Description BLOOD RIGHT ARM  Final   Special Requests BOTTLES DRAWN AEROBIC ONLY 10CC  Final   Culture   Final           BLOOD CULTURE  RECEIVED NO GROWTH TO DATE CULTURE WILL BE HELD FOR 5 DAYS BEFORE ISSUING A FINAL NEGATIVE REPORT Performed at [facility]    Report Status PENDING  Incomplete  Urine culture     Status: None   Collection Time: 02/09/14 10:37 AM  Result Value Ref Range Status   Specimen Description URINE, CATHETERIZED  Final   Special Requests NONE  Final   Colony Count   Final    6,000 COLONIES/ML Performed at [facility]    Culture   Final    INSIGNIFICANT GROWTH Performed at [facility]    Report Status 02/11/2014 FINAL  Final     Studies: No results found.  Scheduled Meds: . ceFEPime (MAXIPIME) IV  1 g Intravenous Q24H  . diclofenac sodium  2 g Topical BID  . docusate  200 mg Oral BID  . enalapril  5 mg Oral Daily  . feeding supplement (ENSURE)  1 Container Oral TID BM  . ferrous sulfate  325 mg Oral BID WC  . heparin  5,000 Units Subcutaneous 3 times per day  . levothyroxine  75 mcg Oral QAC breakfast  . metoprolol tartrate  50 mg Oral BID  . pantoprazole  40 mg Oral Daily  . sodium chloride  3 mL Intravenous Q12H  . triamcinolone cream  1 application Topical BID   Continuous Infusions:   Time spent: 25 mins  Ishan Sanroman M.D. [facility] 02/12/2014, 12:18 PM  Pager: [phone]

## 2014-02-12 NOTE — Progress Notes (Signed)
02/12/2014 6:17 PM Pt. Family concerned about patient continued flushing and c/o feeling bad. Pt. Current temp 100.0 despite prn tylenol being given at 1630. Dr. Isidoro Donningai paged and made aware. Verbal order Ibuprofen 400 mg PO x1. Orders enacted. Pt. And family updated on plan of care. Confirmed with pharmacy ok to crush medication per speech recommendations. Will continue to closely monitor patient.  Kennadee Walthour, Blanchard KelchStephanie Ingold

## 2014-02-13 MED ORDER — SODIUM CHLORIDE 0.9 % IV SOLN: INTRAVENOUS | Status: DC

## 2014-02-13 MED ORDER — DEXTROSE-NACL 5-0.45 % IV SOLN
INTRAVENOUS | Status: DC
  Administered 2014-02-13 – 2014-02-17 (×7): via INTRAVENOUS

## 2014-02-13 NOTE — Progress Notes (Signed)
Utilization review completed.  

## 2014-02-13 NOTE — Progress Notes (Signed)
[facility] PROGRESS NOTE  Sherie DonMildred B Eimer ZOX:[id number] DOB: 1919-02-20 DOA: 02/07/2014   PCP: Illene RegulusMichael [name], MD   Brief history of present illness;   Patient is a 81 year old female with a hypothyroid, hypertension, GERD, dementia, COPD presented from the skilled nursing facility with syncopal episode. Per EMS, patient passed out at about 11:30 PM when she was sitting on the commode for about 5 minutes.  Workup in the emergency department included a chest x-ray which was interpreted by radiology to have developing cardiomegaly, basilar congestion possible pulmonary edema. Patient was administered IV Lasix. She did not appear volume overloaded however and BNP came back at 73.7. UA positive for UTI, patient was started on IV Rocephin. She also appeared to be dehydrated rather than volume overloaded, was given 500 mL IV bolus, spiking temp of 102.5 and 103.6.    Assessment/Plan: Serratia marcescens Sepsis likely due to UTI-  present on admission, evidenced by temperature of 102.5, HR 103, RR 28, source likely UTI  - WBC count normalized. Urine culture showed no growth however urine culture was not done prior to antibiotics at the time of admission. - Patient will need oral antibiotics for at least 2 weeks.  - Swallow evaluation done, also has dysphagia, possibility of aspiration, on dysphagia 1 diet  Questionable Fluid overload:  - Initial chest x-ray reported pulmonary vascular congestion, BNP 73.7. 2-D echo showed EF of 60-65%, grade 1 diastolic dysfunction. Daughter reports that there is NO history of CHF and upset about the "misdiagnosis". Patient had received lasix dose at the time of admission and family is upset that patient "went down" and became "dehydrated" after that. She feels patient 'improved' after IV fluids. Patient's daughter wants IVF to be continued as patient is not eating enough.          Urinary tract infection. -Patient having a syncopal event at her nursing  home, presenting with sepsis -Continue IV cefepime, urine culture showed no significant growth, only 6000 colonies   Syncope -Likely secondary to underlying infectious process, dehydration, sepsis   Hypertension.  -BP currently stable, monitor closely  Acute encephalopathy superimposed on dementia: Currently in memory care unit - Likely worsened due to #1, UTI  Nutrition : - Not eating enough, placed a nutrition consult   Code Status: DO NOT RESUSCITATE  Family Communication: discussed with the daughter in detail on phone.  Disposition Plan: Anticipate discharge to her skilled nursing facility on Mon   Antibiotics:  Cefepime 1/12  Subjective:  Patient seen and examined, sleepy and lethargic, no fevers, no acute issues overnight    Objective:  BP 155/54 mmHg  Pulse 64  Temp(Src) 97.6 F (36.4 C) (Axillary)  Resp 18  Ht 5' (1.524 m)  Wt 54.6 kg (120 lb 5.9 oz)  BMI 23.51 kg/m2  SpO2 97%   Intake/Output Summary (Last 24 hours) at 02/13/14 [id number] Last data filed at 02/12/14 1851  Gross per 24 hour  Intake    120 ml  Output      0 ml  Net    120 ml   Filed Weights   02/11/14 0500 02/12/14 0437 02/13/14 0700  Weight: 55.9 kg (123 lb 3.8 oz) 54.9 kg (121 lb 0.5 oz) 54.6 kg (120 lb 5.9 oz)    Exam:   General: Lethargic, arousable   Cardiovascular: Regular rate and rhythm normal S1-S2  Respiratory: CTAB  Abdomen: Soft nontender nondistended  Ext: no c/c/e  Data Reviewed: Basic Metabolic Panel:  Recent Labs Lab 02/08/14 0359 02/09/14  0230 02/10/14 0236 02/10/14 1725 02/11/14 0426 02/12/14 0310  NA 139 139 145  --  144 145  K 3.8 3.7 2.8* 4.6 3.9 3.7  CL 104 104 117*  --  109 112  CO2 --  26 25  GLUCOSE 139* 151* 126*  --  134* 120*  BUN 27* 27* 25*  --  19 28*  CREATININE 1.05 1.25* 0.87  --  0.80 0.88  CALCIUM 9.0 8.6 7.9*  --  9.5 9.4  MG 2.2  --   --   --   --   --    Liver Function Tests: No results for input(s): AST, ALT,  ALKPHOS, BILITOT, PROT, ALBUMIN in the last 168 hours. No results for input(s): LIPASE, AMYLASE in the last 168 hours. No results for input(s): AMMONIA in the last 168 hours. CBC:  Recent Labs Lab 02/08/14 0017  02/08/14 0359 02/09/14 0230 02/10/14 0236 02/11/14 0426 02/12/14 0310  WBC 8.4  --  8.4 18.1* 15.0* 14.1* 10.2  NEUTROABS 5.8  --   --   --   --   --   --   HGB 10.7*  < > 10.7* 11.2* 9.7* 11.5* 9.9*  HCT 33.7*  < > 34.2* 35.5* 31.1* 37.0 32.2*  MCV 94.7  --  91.9 93.4 93.1 93.0 93.1  PLT 291  --  300 284 267 315 304  < > = values in this interval not displayed. Cardiac Enzymes:  Recent Labs Lab 02/08/14 0359 02/08/14 0905 02/08/14 1710  TROPONINI <0.03 <0.03 <0.03   BNP (last 3 results) No results for input(s): PROBNP in the last 8760 hours. CBG:  Recent Labs Lab 02/09/14 0902 02/10/14 0936 02/12/14 0818  GLUCAP 121* 109* 107*    Recent Results (from the past 240 hour(s))  MRSA PCR Screening     Status: None   Collection Time: 02/08/14  6:38 AM  Result Value Ref Range Status   MRSA by PCR NEGATIVE NEGATIVE Final    Comment:        The GeneXpert MRSA Assay (FDA approved for NASAL specimens only), is one component of a comprehensive MRSA colonization surveillance program. It is not intended to diagnose MRSA infection nor to guide or monitor treatment for MRSA infections.   Culture, blood (routine x 2)     Status: None   Collection Time: 02/08/14  5:00 PM  Result Value Ref Range Status   Specimen Description BLOOD RIGHT ARM  Final   Special Requests BOTTLES DRAWN AEROBIC AND ANAEROBIC 10CC  Final   Culture   Final    SERRATIA MARCESCENS Note: Gram Stain Report Called to,Read Back By and Verified With: TATA CARBONE 02/09/14 0945 BY SMITHERSJ Performed at [facility]    Report Status 02/11/2014 FINAL  Final   Organism ID, Bacteria SERRATIA MARCESCENS  Final      Susceptibility   Serratia marcescens - MIC*    CEFAZOLIN >=64 RESISTANT  Resistant     CEFEPIME <=1 SENSITIVE Sensitive     CEFTAZIDIME <=1 SENSITIVE Sensitive     CEFTRIAXONE <=1 SENSITIVE Sensitive     CIPROFLOXACIN <=0.25 SENSITIVE Sensitive     GENTAMICIN <=1 SENSITIVE Sensitive     TOBRAMYCIN 8 INTERMEDIATE Intermediate     TRIMETH/SULFA <=20 SENSITIVE Sensitive     * SERRATIA MARCESCENS  Culture, blood (routine x 2)     Status: None (Preliminary result)   Collection Time: 02/08/14  5:10 PM  Result Value Ref Range Status  Specimen Description BLOOD RIGHT ARM  Final   Special Requests BOTTLES DRAWN AEROBIC ONLY 10CC  Final   Culture   Final           BLOOD CULTURE RECEIVED NO GROWTH TO DATE CULTURE WILL BE HELD FOR 5 DAYS BEFORE ISSUING A FINAL NEGATIVE REPORT Performed at [facility]    Report Status PENDING  Incomplete  Urine culture     Status: None   Collection Time: 02/09/14 10:37 AM  Result Value Ref Range Status   Specimen Description URINE, CATHETERIZED  Final   Special Requests NONE  Final   Colony Count   Final    6,000 COLONIES/ML Performed at [facility]    Culture   Final    INSIGNIFICANT GROWTH Performed at [facility]    Report Status 02/11/2014 FINAL  Final     Studies: No results found.  Scheduled Meds: . ceFEPime (MAXIPIME) IV  1 g Intravenous Q24H  . diclofenac sodium  2 g Topical BID  . docusate  200 mg Oral BID  . enalapril  5 mg Oral Daily  . feeding supplement (ENSURE)  1 Container Oral TID BM  . ferrous sulfate  325 mg Oral BID WC  . heparin  5,000 Units Subcutaneous 3 times per day  . levothyroxine  75 mcg Oral QAC breakfast  . metoprolol tartrate  50 mg Oral BID  . pantoprazole  40 mg Oral Daily  . sodium chloride  3 mL Intravenous Q12H  . triamcinolone cream  1 application Topical BID   Continuous Infusions:   Time spent: 25 mins  RAI,RIPUDEEP M.D. [facility] 02/13/2014, 9:37 AM  Pager: [phone]

## 2014-02-14 NOTE — Progress Notes (Signed)
INITIAL NUTRITION ASSESSMENT  DOCUMENTATION CODES Per approved criteria  -Severe malnutrition in the context of chronic illness  Pt meets criteria for severe MALNUTRITION in the context of chronic illness as evidenced by moderate to severe fat and muscle wasting.  INTERVENTION: - Continue Magic cup TID with meals, each supplement provides 290 kcal and 9 grams of protein - Continue Ensure Pudding po TID, each supplement provides 170 kcal and 4 grams of protein - Spoke with RN to continue to encourage pt to consume supplements and agreed to give pt extra time and assistance at meals.   NUTRITION DIAGNOSIS: Inadequate oral intake related to chronic illness as evidenced by poor po intake; ongoing  Goal: Pt to meet >/= 90% of their estimated nutrition needs; not met   Monitor:  Weight trend, po intake, acceptance of supplements, labs  81 y.o. female  Admitting Dx: Syncope  ASSESSMENT: 81 y.o. female with past medical history of hypothyroidism, hypertension, GERD, dementia, COPD, hx of GIB, who presents with syncope.  1/13: - Pt seen by SLP who recommended a pureed diet with nectar-thickened liquids.  - Pt with UTI and sepsis.  - Pt very hard of hearing.  - Spoke with son and nurse tech who reported that pt ate 100% of her magic cup at breakfast, 2 bites of eggs, and orange juice. Son says that pt normally has a good appetite.  Nutrition Focused Physical Exam:  Subcutaneous Fat:  Orbital Region: moderate wasting Upper Arm Region: mild to moderate wasting Thoracic and Lumbar Region: n/a  Muscle:  Temple Region: moderate to sever wasting Clavicle Bone Region: severe wasting Clavicle and Acromion Bone Region: moderate to severe wasting Scapular Bone Region: n/a Dorsal Hand: moderate wasting Patellar Region: moderate to severe wasting Anterior Thigh Region: moderate wasting Posterior Calf Region: moderate wasting  Edema: none  1/18: - Consult received for poor po  intake. - Per RN, pt with very little po. She said that OT was able to get pt to take several bites of breakfast and that Tech continued to encourage po intake. - RD observed pt's tray with only a few bites missing.  - RN reports that pt is able to take pills with applesauce without problem.  - Pt eating <10% of meals.  - If aggressive interventions warranted, may need to consider enteral nutrition.   Labs: CBGs: 107-121 Na and K WNL BUN elevated  Height: Ht Readings from Last 1 Encounters:  02/13/14 5' (1.524 m)    Weight: Wt Readings from Last 1 Encounters:  02/13/14 120 lb 5.9 oz (54.6 kg)    Wt Readings from Last 10 Encounters:  02/13/14 120 lb 5.9 oz (54.6 kg)  06/02/12 109 lb (49.442 kg)  12/31/11 109 lb 9.1 oz (49.7 kg)  10/15/11 111 lb (50.349 kg)  08/19/11 108 lb (48.988 kg)  08/01/11 111 lb 8.8 oz (50.6 kg)  05/23/11 111 lb (50.349 kg)  01/02/11 110 lb (49.896 kg)  07/17/10 116 lb (52.617 kg)  03/06/09 117 lb (53.071 kg)    BMI:  Body mass index is 23.51 kg/(m^2).  Estimated Nutritional Needs: Kcal: 1300-1500 Protein: 65-75 g Fluid: 1.5 L/day  Skin: Intact  Diet Order: DIET - DYS 1  EDUCATION NEEDS: -No education needs identified at this time  No intake or output data in the 24 hours ending 02/14/14 1154  Last BM: prior to admission   Labs:   Recent Labs Lab 02/08/14 0359  02/10/14 0236 02/10/14 1725 02/11/14 0426 02/12/14 0310  NA 139  < >   145  --  144 145  K 3.8  < > 2.8* 4.6 3.9 3.7  CL 104  < > 117*  --  109 112  CO2 26  < > 22  --  26 25  BUN 27*  < > 25*  --  19 28*  CREATININE 1.05  < > 0.87  --  0.80 0.88  CALCIUM 9.0  < > 7.9*  --  9.5 9.4  MG 2.2  --   --   --   --   --   GLUCOSE 139*  < > 126*  --  134* 120*  < > = values in this interval not displayed.  CBG (last 3)   Recent Labs  02/12/14 0818  GLUCAP 107*    Scheduled Meds: . ceFEPime (MAXIPIME) IV  1 g Intravenous Q24H  . diclofenac sodium  2 g Topical BID   . docusate  200 mg Oral BID  . enalapril  5 mg Oral Daily  . feeding supplement (ENSURE)  1 Container Oral TID BM  . ferrous sulfate  325 mg Oral BID WC  . heparin  5,000 Units Subcutaneous 3 times per day  . levothyroxine  75 mcg Oral QAC breakfast  . metoprolol tartrate  50 mg Oral BID  . pantoprazole  40 mg Oral Daily  . sodium chloride  3 mL Intravenous Q12H  . triamcinolone cream  1 application Topical BID    Continuous Infusions: . dextrose 5 % and 0.45% NaCl 75 mL/hr at 02/14/14 0005    Past Medical History  Diagnosis Date  . Unspecified hypothyroidism   . Disorder of bone and cartilage, unspecified   . Chronic airway obstruction, not elsewhere classified   . Unspecified essential hypertension   . Esophageal reflux   . Other and unspecified hyperlipidemia   . Dementia     Past Surgical History  Procedure Laterality Date  . Cataract extractions      bilateral  . Abdominal hysterectomy      total  . Bilateral salpingoopherectomy    . Rectal polyp removal      No malignancy identified      MS, RD, LDN  

## 2014-02-14 NOTE — Progress Notes (Signed)
Medicare Important Message given? YES  (If response is "NO", the following Medicare IM given date fields will be blank)  Date Medicare IM given: 02/14/14 Medicare IM given by:  Escher Harr  

## 2014-02-14 NOTE — Progress Notes (Signed)
Physical Therapy Treatment Patient Details Name: [name] MRN: [id number] DOB: 1919-07-15 Today's Date: 02/14/2014    History of Present Illness 81 y.o. female with past medical history of hypothyroidism, hypertension, GERD, dementia, COPD, hx of GIB, who presents with syncope.  UTI, sepsis    PT Comments    Pt very pleasant with flat affect. Pt appears fearful of mobility as resistant toward pivot to chair in standing and increased mobility with reassurance and assist of dgtr present. Dgtr clarified that pt wanders in hallway with RW at memory care and is total assist for ADLs but does feed herself normally. Pt responding to dgtrs questions at times but would only state her name and that she was comfortable for therapist. Dgtr expressing concern over pt care and frustration with medical management. Discussed with dgtr therapy plan and goal to return pt to walking. Will continue to follow as pt cognitive status allow. Pt incontinent of urine on arrival and total assist by tech for pericare with mobility.   Follow Up Recommendations  SNF;Supervision/Assistance - 24 hour     Equipment Recommendations       Recommendations for Other Services       Precautions / Restrictions Precautions Precautions: Fall    Mobility  Bed Mobility Overal bed mobility: Needs Assistance       Supine to sit: Max assist     General bed mobility comments: cues for sequence with max assist to bring legs off of bed and mod assist to elevate trunk from surface  Transfers Overall transfer level: Needs assistance   Transfers: Sit to/from Stand;Squat Pivot Transfers Sit to Stand: Max assist;From elevated surface Stand pivot transfers: Total assist       General transfer comment: sit to stand x 4 with max cues for hand placement and sequence with utilization of side assist as well as face to face and no significant difference with technique. With dgtr present pt able to release bed rail and  not resist pivot to chair. Pivot with total physical assist along with coaxing and HHA of dgtr  Ambulation/Gait                 Stairs            Wheelchair Mobility    Modified Rankin (Stroke Patients Only)       Balance Overall balance assessment: Needs assistance   Sitting balance-Leahy Scale: Fair       Standing balance-Leahy Scale: Poor                      Cognition Arousal/Alertness: Awake/alert Behavior During Therapy: Flat affect Overall Cognitive Status: History of cognitive impairments - at baseline       Memory: Decreased short-term memory;Decreased recall of precautions              Exercises      General Comments        Pertinent Vitals/Pain Pain Assessment: No/denies pain (PAINAD= 0)    Home Living                      Prior Function            PT Goals (current goals can now be found in the care plan section) Progress towards PT goals: Progressing toward goals (very slowly)    Frequency  Min 3X/week    PT Plan Current plan remains appropriate    Co-evaluation  End of Session Equipment Utilized During Treatment: Gait belt Activity Tolerance: Patient limited by fatigue Patient left: in chair;with call bell/phone within reach;with nursing/sitter in room;with family/visitor present (family stated she would not leave without Rn notification prior)     Time: 1610-9604 PT Time Calculation (min) (ACUTE ONLY): 38 min  Charges:  $Therapeutic Activity: 23-37 mins $Self Care/Home Management: 8-22                    G Codes:      [name] 2014/03/08, 12:42 PM [name], PT [phone]

## 2014-02-14 NOTE — Progress Notes (Signed)
ANTIBIOTIC CONSULT NOTE - Follow Up  Pharmacy Consult for cefepime Indication: sepsis  No Known Allergies  Patient Measurements: Height: 5' (152.4 cm) Weight: 120 lb 5.9 oz (54.6 kg) IBW/kg (Calculated) : 45.5  Vital Signs: Temp: 98.7 F (37.1 C) (01/18 1300) Temp Source: Oral (01/18 0458) BP: 138/57 mmHg (01/18 1300) Pulse Rate: 61 (01/18 1300) Intake/Output from previous day:   Intake/Output from this shift: Total I/O In: 120 [P.O.:120] Out: -   Labs:  Recent Labs  02/12/14 0310  WBC 10.2  HGB 9.9*  PLT 304  CREATININE 0.88   Estimated Creatinine Clearance: 31.6 mL/min (by C-G formula based on Cr of 0.88). No results for input(s): VANCOTROUGH, VANCOPEAK, VANCORANDOM, GENTTROUGH, GENTPEAK, GENTRANDOM, TOBRATROUGH, TOBRAPEAK, TOBRARND, AMIKACINPEAK, AMIKACINTROU, AMIKACIN in the last 72 hours.   Microbiology: Recent Results (from the past 720 hour(s))  MRSA PCR Screening     Status: None   Collection Time: 02/08/14  6:38 AM  Result Value Ref Range Status   MRSA by PCR NEGATIVE NEGATIVE Final    Comment:        The GeneXpert MRSA Assay (FDA approved for NASAL specimens only), is one component of a comprehensive MRSA colonization surveillance program. It is not intended to diagnose MRSA infection nor to guide or monitor treatment for MRSA infections.   Culture, blood (routine x 2)     Status: None   Collection Time: 02/08/14  5:00 PM  Result Value Ref Range Status   Specimen Description BLOOD RIGHT ARM  Final   Special Requests BOTTLES DRAWN AEROBIC AND ANAEROBIC 10CC  Final   Culture   Final    SERRATIA MARCESCENS Note: Gram Stain Report Called to,Read Back By and Verified With: TATA CARBONE 02/09/14 0945 BY SMITHERSJ Performed at [facility]    Report Status 02/11/2014 FINAL  Final   Organism ID, Bacteria SERRATIA MARCESCENS  Final      Susceptibility   Serratia marcescens - MIC*    CEFAZOLIN >=64 RESISTANT Resistant     CEFEPIME <=1  SENSITIVE Sensitive     CEFTAZIDIME <=1 SENSITIVE Sensitive     CEFTRIAXONE <=1 SENSITIVE Sensitive     CIPROFLOXACIN <=0.25 SENSITIVE Sensitive     GENTAMICIN <=1 SENSITIVE Sensitive     TOBRAMYCIN 8 INTERMEDIATE Intermediate     TRIMETH/SULFA <=20 SENSITIVE Sensitive     * SERRATIA MARCESCENS  Culture, blood (routine x 2)     Status: None (Preliminary result)   Collection Time: 02/08/14  5:10 PM  Result Value Ref Range Status   Specimen Description BLOOD RIGHT ARM  Final   Special Requests BOTTLES DRAWN AEROBIC ONLY 10CC  Final   Culture   Final           BLOOD CULTURE RECEIVED NO GROWTH TO DATE CULTURE WILL BE HELD FOR 5 DAYS BEFORE ISSUING A FINAL NEGATIVE REPORT Performed at [facility]    Report Status PENDING  Incomplete  Urine culture     Status: None   Collection Time: 02/09/14 10:37 AM  Result Value Ref Range Status   Specimen Description URINE, CATHETERIZED  Final   Special Requests NONE  Final   Colony Count   Final    6,000 COLONIES/ML Performed at [facility]    Culture   Final    INSIGNIFICANT GROWTH Performed at [facility]    Report Status 02/11/2014 FINAL  Final    Medical History: Past Medical History  Diagnosis Date  . Unspecified hypothyroidism   .  Disorder of bone and cartilage, unspecified   . Chronic airway obstruction, not elsewhere classified   . Unspecified essential hypertension   . Esophageal reflux   . Other and unspecified hyperlipidemia   . Dementia     Assessment: 81 year old woman initially started on ceftriaxone then tansitioned to cefepime for sepsis.    1/13 urine - NG 1/15 Bcx - Serratia - sensitive to ceftriaxone, cefepime, cipro, bactrim  WBC improved 18 > 14 > 10  BP, HR stable on metoprolol, enalapril Her creatinine is 1.05 > 0.8 improved  Goal of Therapy:  Treat infection  Plan:  cefepime 1g IV q24  Monitor cultures and renal function  Recheck blood cultures Per MD will need additional  2 weeks of po abx  Sheppard CoilFrank Wilson PharmD., BCPS Clinical Pharmacist Pager [phone] 02/14/2014 1:52 PM

## 2014-02-14 NOTE — Progress Notes (Signed)
[facility] PROGRESS NOTE  Sherie DonMildred B Schoenfelder ZOX:[id number] DOB: 1919/02/04 DOA: 02/07/2014   PCP: Illene RegulusMichael [name], MD   Brief history of present illness;   Patient is a 81 year old female with a hypothyroid, hypertension, GERD, dementia, COPD presented from the skilled nursing facility with syncopal episode. Per EMS, patient passed out at about 11:30 PM when she was sitting on the commode for about 5 minutes.  Workup in the emergency department included a chest x-ray which was interpreted by radiology to have developing cardiomegaly, basilar congestion possible pulmonary edema. Patient was administered IV Lasix. She did not appear volume overloaded however and BNP came back at 73.7. UA positive for UTI, patient was started on IV Rocephin. She also appeared to be dehydrated rather than volume overloaded, was given 500 mL IV bolus, spiking temp of 102.5 and 103.6.    Assessment/Plan: Serratia marcescens Sepsis likely due to UTI-  present on admission, evidenced by temperature of 102.5, HR 103, RR 28, source likely UTI  - WBC count normalized. Urine culture showed no growth however urine culture was not done prior to antibiotics at the time of admission. - Patient will need oral antibiotics for at least 2 weeks.  - Swallow evaluation done, also has dysphagia, possibility of aspiration, on dysphagia 1 diet  Questionable Fluid overload:  -" Initial chest x-ray reported pulmonary vascular congestion, BNP 73.7. 2-D echo showed EF of 60-65%, grade 1 diastolic dysfunction. Daughter reports that there is NO history of CHF and upset about the "misdiagnosis". Patient had received lasix dose at the time of admission and family is upset that patient "went down" and became "dehydrated" after that. She feels patient 'improved' after IV fluids".  - Patient's daughter wants IVF to be continued as patient is not eating enough, I agree with it as patient is not eating too well. Nutrition consult also placed         Urinary tract infection. -Patient having a syncopal event at her nursing home, presenting with sepsis -Continue IV cefepime, urine culture showed no significant growth, only 6000 colonies   Syncope -Likely secondary to underlying infectious process, dehydration, sepsis   Hypertension.  -BP currently stable, monitor closely  Acute encephalopathy superimposed on dementia: Currently in memory care unit - Likely worsened due to #1, UTI  Nutrition : - Not eating enough, placed a nutrition consult   Code Status: DO NOT RESUSCITATE  Family Communication: discussed with the daughter in detail on phone.  Disposition Plan: Per daughter, she is NOT comfortable with her leaving the hospital until she is eating or at least walking with cane. Will increase ambulation, have PT to reevaluate, OOB to chair     Antibiotics:  Cefepime 1/12  Subjective:  Patient seen and examined, sleepy and lethargic, no fevers, no acute issues overnight    Objective:  BP 143/56 mmHg  Pulse 64  Temp(Src) 98.9 F (37.2 C) (Oral)  Resp 18  Ht 5' (1.524 m)  Wt 54.6 kg (120 lb 5.9 oz)  BMI 23.51 kg/m2  SpO2 97%  No intake or output data in the 24 hours ending 02/14/14 1118 Filed Weights   02/11/14 0500 02/12/14 0437 02/13/14 0700  Weight: 55.9 kg (123 lb 3.8 oz) 54.9 kg (121 lb 0.5 oz) 54.6 kg (120 lb 5.9 oz)    Exam:   General: Lethargic  Cardiovascular: Regular rate and rhythm normal S1-S2  Respiratory: CTAB  Abdomen: Soft nontender nondistended  Ext: no c/c/e  Data Reviewed: Basic Metabolic Panel:  Recent Labs  Lab 02/08/14 0359 02/09/14 0230 02/10/14 0236 02/10/14 1725 02/11/14 0426 02/12/14 0310  NA 139 139 145  --  144 145  K 3.8 3.7 2.8* 4.6 3.9 3.7  CL 104 104 117*  --  109 112  CO2 --  26 25  GLUCOSE 139* 151* 126*  --  134* 120*  BUN 27* 27* 25*  --  19 28*  CREATININE 1.05 1.25* 0.87  --  0.80 0.88  CALCIUM 9.0 8.6 7.9*  --  9.5 9.4  MG 2.2  --    --   --   --   --    Liver Function Tests: No results for input(s): AST, ALT, ALKPHOS, BILITOT, PROT, ALBUMIN in the last 168 hours. No results for input(s): LIPASE, AMYLASE in the last 168 hours. No results for input(s): AMMONIA in the last 168 hours. CBC:  Recent Labs Lab 02/08/14 0017  02/08/14 0359 02/09/14 0230 02/10/14 0236 02/11/14 0426 02/12/14 0310  WBC 8.4  --  8.4 18.1* 15.0* 14.1* 10.2  NEUTROABS 5.8  --   --   --   --   --   --   HGB 10.7*  < > 10.7* 11.2* 9.7* 11.5* 9.9*  HCT 33.7*  < > 34.2* 35.5* 31.1* 37.0 32.2*  MCV 94.7  --  91.9 93.4 93.1 93.0 93.1  PLT 291  --  300 284 267 315 304  < > = values in this interval not displayed. Cardiac Enzymes:  Recent Labs Lab 02/08/14 0359 02/08/14 0905 02/08/14 1710  TROPONINI <0.03 <0.03 <0.03   BNP (last 3 results) No results for input(s): PROBNP in the last 8760 hours. CBG:  Recent Labs Lab 02/09/14 0902 02/10/14 0936 02/12/14 0818  GLUCAP 121* 109* 107*    Recent Results (from the past 240 hour(s))  MRSA PCR Screening     Status: None   Collection Time: 02/08/14  6:38 AM  Result Value Ref Range Status   MRSA by PCR NEGATIVE NEGATIVE Final    Comment:        The GeneXpert MRSA Assay (FDA approved for NASAL specimens only), is one component of a comprehensive MRSA colonization surveillance program. It is not intended to diagnose MRSA infection nor to guide or monitor treatment for MRSA infections.   Culture, blood (routine x 2)     Status: None   Collection Time: 02/08/14  5:00 PM  Result Value Ref Range Status   Specimen Description BLOOD RIGHT ARM  Final   Special Requests BOTTLES DRAWN AEROBIC AND ANAEROBIC 10CC  Final   Culture   Final    SERRATIA MARCESCENS Note: Gram Stain Report Called to,Read Back By and Verified With: TATA CARBONE 02/09/14 0945 BY SMITHERSJ Performed at [facility]    Report Status 02/11/2014 FINAL  Final   Organism ID, Bacteria SERRATIA MARCESCENS  Final       Susceptibility   Serratia marcescens - MIC*    CEFAZOLIN >=64 RESISTANT Resistant     CEFEPIME <=1 SENSITIVE Sensitive     CEFTAZIDIME <=1 SENSITIVE Sensitive     CEFTRIAXONE <=1 SENSITIVE Sensitive     CIPROFLOXACIN <=0.25 SENSITIVE Sensitive     GENTAMICIN <=1 SENSITIVE Sensitive     TOBRAMYCIN 8 INTERMEDIATE Intermediate     TRIMETH/SULFA <=20 SENSITIVE Sensitive     * SERRATIA MARCESCENS  Culture, blood (routine x 2)     Status: None (Preliminary result)   Collection Time: 02/08/14  5:10 PM  Result  Value Ref Range Status   Specimen Description BLOOD RIGHT ARM  Final   Special Requests BOTTLES DRAWN AEROBIC ONLY 10CC  Final   Culture   Final           BLOOD CULTURE RECEIVED NO GROWTH TO DATE CULTURE WILL BE HELD FOR 5 DAYS BEFORE ISSUING A FINAL NEGATIVE REPORT Performed at [facility]    Report Status PENDING  Incomplete  Urine culture     Status: None   Collection Time: 02/09/14 10:37 AM  Result Value Ref Range Status   Specimen Description URINE, CATHETERIZED  Final   Special Requests NONE  Final   Colony Count   Final    6,000 COLONIES/ML Performed at [facility]    Culture   Final    INSIGNIFICANT GROWTH Performed at [facility]    Report Status 02/11/2014 FINAL  Final     Studies: No results found.  Scheduled Meds: . ceFEPime (MAXIPIME) IV  1 g Intravenous Q24H  . diclofenac sodium  2 g Topical BID  . docusate  200 mg Oral BID  . enalapril  5 mg Oral Daily  . feeding supplement (ENSURE)  1 Container Oral TID BM  . ferrous sulfate  325 mg Oral BID WC  . heparin  5,000 Units Subcutaneous 3 times per day  . levothyroxine  75 mcg Oral QAC breakfast  . metoprolol tartrate  50 mg Oral BID  . pantoprazole  40 mg Oral Daily  . sodium chloride  3 mL Intravenous Q12H  . triamcinolone cream  1 application Topical BID   Continuous Infusions: . dextrose 5 % and 0.45% NaCl 75 mL/hr at 02/14/14 0005   Time spent: 25  mins  Avie Checo M.D. [facility] 02/14/2014, 11:18 AM  Pager: [phone]

## 2014-02-14 NOTE — Clinical Social Work Note (Addendum)
2:00pm  CSW spoke with patients daughter, Bonita QuinLinda, at bedside.  Bonita QuinLinda is now agreeable to SNF placement while patient continues IV antibiotics and needs a high level of assistance- reports that at baseline patient is using walker.  [place] preference [place] [facility]  10:15am CS[facility] to [facility] [place]- no answer- left voicemail.  CSW will continue to follow.  Merlyn LotJenna Holoman, LCSWA Clinical Social Worker [phone]

## 2014-02-14 NOTE — Progress Notes (Signed)
Speech Language Pathology Treatment: Dysphagia  Patient Details Name: [name] MRN: [id number] DOB: 1919/07/16 Today's Date: 02/14/2014 Time: 2902-1115 SLP Time Calculation (min) (ACUTE ONLY): 35 min  Assessment / Plan / Recommendation Clinical Impression  SLP assisted pt with am meal, providing total assist for upright posture, max tactile cues for increased arousal and awareness. Pt kept eyes closed and was non verbal, but responded to cueing for PO intake with automatic response. Hand over hand assist and tactile cues successful in initiating appropriate oral manipulation to POs. Transit and swallow response still delayed, but no overt signs of aspiration observed. Pt is tolerating diet, which will likely need to continue into long term with careful hand feeding. No family present for education today, though instruction was provided to son in prior session. Will sign off at this time.    HPI HPI: 81 y.o. female with past medical history of hypothyroidism, hypertension, GERD, dementia, COPD, hx of GIB, who presents with syncope. CXR 12/12 No active disease.   Pertinent Vitals    SLP Plan  All goals met    Recommendations Diet recommendations: Dysphagia 1 (puree);Nectar-thick liquid Liquids provided via: Cup;No straw Medication Administration: Whole meds with puree Supervision: Full supervision/cueing for compensatory strategies Compensations: Slow rate;Small sips/bites;Check for pocketing Postural Changes and/or Swallow Maneuvers: Seated upright 90 degrees;Upright 30-60 min after meal              Oral Care Recommendations: Oral care before and after PO Follow up Recommendations: Skilled Nursing facility Plan: All goals met    GO    Herbie Baltimore, MA CCC-SLP [phone]  Lynann Beaver 02/14/2014, 9:02 AM

## 2014-02-15 DIAGNOSIS — A047 Enterocolitis due to Clostridium difficile: Secondary | ICD-10-CM

## 2014-02-15 DIAGNOSIS — A0472 Enterocolitis due to Clostridium difficile, not specified as recurrent: Secondary | ICD-10-CM | POA: Diagnosis present

## 2014-02-15 DIAGNOSIS — E46 Unspecified protein-calorie malnutrition: Secondary | ICD-10-CM

## 2014-02-15 DIAGNOSIS — A498 Other bacterial infections of unspecified site: Secondary | ICD-10-CM

## 2014-02-15 DIAGNOSIS — N390 Urinary tract infection, site not specified: Secondary | ICD-10-CM

## 2014-02-15 LAB — HEPATIC FUNCTION PANEL
ALT: 22 U/L (ref 0–35)
AST: 28 U/L (ref 0–37)
Albumin: 2.6 g/dL — ABNORMAL LOW (ref 3.5–5.2)
Alkaline Phosphatase: 75 U/L (ref 39–117)
Bilirubin, Direct: <0.1 mg/dL (ref 0.0–0.3)
Total Bilirubin: 0.4 mg/dL (ref 0.3–1.2)
Total Protein: 5.4 g/dL — ABNORMAL LOW (ref 6.0–8.3)

## 2014-02-15 LAB — BASIC METABOLIC PANEL
Anion gap: 12 (ref 5–15)
BUN: 19 mg/dL (ref 6–23)
CO2: 24 mmol/L (ref 19–32)
Calcium: 8.9 mg/dL (ref 8.4–10.5)
Chloride: 107 mEq/L (ref 96–112)
Creatinine, Ser: 0.88 mg/dL (ref 0.50–1.10)
GFR calc Af Amer: 64 mL/min — ABNORMAL LOW (ref >90)
GFR calc non Af Amer: 55 mL/min — ABNORMAL LOW (ref >90)
Glucose, Bld: 159 mg/dL — ABNORMAL HIGH (ref 70–99)
Potassium: 3.3 mmol/L — ABNORMAL LOW (ref 3.5–5.1)
Sodium: 143 mmol/L (ref 135–145)

## 2014-02-15 LAB — CULTURE, BLOOD (ROUTINE X 2): Culture: NO GROWTH

## 2014-02-15 LAB — CBC
HEMATOCRIT: 33.6 % — AB (ref 36.0–46.0)
Hemoglobin: 10.1 g/dL — ABNORMAL LOW (ref 12.0–15.0)
MCH: 28.4 pg (ref 26.0–34.0)
MCHC: 30.1 g/dL (ref 30.0–36.0)
MCV: 94.4 fL (ref 78.0–100.0)
Platelets: 302 10*3/uL (ref 150–400)
RBC: 3.56 MIL/uL — AB (ref 3.87–5.11)
RDW: 14.1 % (ref 11.5–15.5)
WBC: 17.3 10*3/uL — ABNORMAL HIGH (ref 4.0–10.5)

## 2014-02-15 LAB — GLUCOSE, CAPILLARY: Glucose-Capillary: 100 mg/dL — ABNORMAL HIGH (ref 70–99)

## 2014-02-15 LAB — CLOSTRIDIUM DIFFICILE BY PCR: Toxigenic C. Difficile by PCR: POSITIVE — AB

## 2014-02-15 MED ORDER — POTASSIUM CHLORIDE 10 MEQ/100ML IV SOLN
10.0000 meq | INTRAVENOUS | Status: AC
  Administered 2014-02-15 (×3): 10 meq via INTRAVENOUS
  Filled 2014-02-15 (×3): qty 100

## 2014-02-15 MED ORDER — SACCHAROMYCES BOULARDII 250 MG PO CAPS
250.0000 mg | ORAL_CAPSULE | Freq: Two times a day (BID) | ORAL | Status: DC
  Administered 2014-02-15 – 2014-02-18 (×6): 250 mg via ORAL
  Filled 2014-02-15 (×7): qty 1

## 2014-02-15 MED ORDER — METRONIDAZOLE 500 MG PO TABS
500.0000 mg | ORAL_TABLET | Freq: Three times a day (TID) | ORAL | Status: DC
  Filled 2014-02-15 (×3): qty 1

## 2014-02-15 MED ORDER — VANCOMYCIN 50 MG/ML ORAL SOLUTION
125.0000 mg | Freq: Four times a day (QID) | ORAL | Status: DC
  Administered 2014-02-15 – 2014-02-18 (×12): 125 mg via ORAL
  Filled 2014-02-15 (×15): qty 2.5

## 2014-02-15 NOTE — Clinical Social Work Note (Signed)
CSW called patients daughter to offer SNF choice- patients daughter informed CSW of new cdiff diagnosis which will delay DC.  CSW spoke with Dr who stated that patient is unable to DC today.  CSW will continue to follow for SNF placement when patient is stable for DC.  Merlyn LotJenna Holoman, LCSWA Clinical Social Worker [phone]

## 2014-02-15 NOTE — Consult Note (Addendum)
[facility]  Total days of antibiotics 8        Day 8 cefepime               Reason for Consult: serratia bacteremia and cdifficile colitis    Referring Physician: Tana Coast  Principal Problem:   Syncope Active Problems:   Hypothyroidism   Essential hypertension   COPD (chronic obstructive pulmonary disease)   GASTROESOPHAGEAL REFLUX DISEASE   Dementia of Alzheimer's type with behavioral disturbance   Blood poisoning   Acute cystitis without hematuria   Sepsis   Enteritis due to Clostridium difficile    HPI: [name] is a 81 y.o. female who is a SNF resident  with dementia, hypothyroid, hypertension, GERD, , COPD presented on 1/12 with syncopal episode. Per EMS, patient passed out at about 11:30 PM when she was sitting on the commode for about 5 minutes. ED work up revealed cxr with cardiomegaly, basilar congestion possible pulmonary edema. She had fevers of 102-103 with leukocytosis of 18 with fever, UA positive for UTI, patient was started on IV Rocephin. Her infectious work up revealed blood cx 1 of 2 + sensitive serratia, urine cx collected after antibiotics had insignificant growth but she did have TMTC on urine microscopy. She was given cefepime for the next 7 days, to treat sepsis with bacteremia probable from urinary source. She started to develop leukocytosis with diarrhea overnight, now found to have c.difficile colitis. No aki.  Her daughter reports that she has never had cdifficile before.  Past Medical History  Diagnosis Date  . Unspecified hypothyroidism   . Disorder of bone and cartilage, unspecified   . Chronic airway obstruction, not elsewhere classified   . Unspecified essential hypertension   . Esophageal reflux   . Other and unspecified hyperlipidemia   . Dementia     Allergies: No Known Allergies   MEDICATIONS: . diclofenac sodium  2 g Topical BID  . docusate  200 mg Oral BID  . enalapril  5 mg Oral Daily  . feeding  supplement (ENSURE)  1 Container Oral TID BM  . ferrous sulfate  325 mg Oral BID WC  . heparin  5,000 Units Subcutaneous 3 times per day  . levothyroxine  75 mcg Oral QAC breakfast  . metoprolol tartrate  50 mg Oral BID  . pantoprazole  40 mg Oral Daily  . sodium chloride  3 mL Intravenous Q12H  . triamcinolone cream  1 application Topical BID  . vancomycin  125 mg Oral 4 times per day    History  Substance Use Topics  . Smoking status: Never Smoker   . Smokeless tobacco: Never Used  . Alcohol Use: No    Family History  Problem Relation Age of Onset  . Heart failure Mother   . Cancer Father     Review of Systems - Unable to obtain due to demenita  OBJECTIVE: Temp:  [97.6 F (36.4 C)-98.8 F (37.1 C)] 98.8 F (37.1 C) (01/19 1435) Pulse Rate:  [72-78] 72 (01/19 1435) Resp:  [17-18] 17 (01/19 1435) BP: (135-187)/(56-71) 187/71 mmHg (01/19 1435) SpO2:  [97 %-99 %] 99 % (01/19 1435) Physical Exam  Constitutional:  Eyes opens to name but not speaking at this time. appears  Stated age. No distress.  HENT:  Mouth/Throat: Oropharynx is dry. No oropharyngeal exudate.  Cardiovascular: Normal rate, regular rhythm and normal heart sounds. Exam reveals no gallop and no friction rub.  No murmur heard.  Pulmonary/Chest:  Effort normal and breath sounds normal. No respiratory distress.  has no wheezes.  Abdominal: Soft. Bowel sounds are decreased.  exhibits no distension. There is no tenderness.  Lymphadenopathy: no cervical adenopathy.  Skin: Skin is warm and dry. No rash noted. No erythema.   LABS: Results for orders placed or performed during the hospital encounter of 02/07/14 (from the past 48 hour(s))  Clostridium Difficile by PCR     Status: Abnormal   Collection Time: 02/15/14  6:11 AM  Result Value Ref Range   C difficile by pcr POSITIVE (A) NEGATIVE    Comment: CRITICAL RESULT CALLED TO, READ BACK BY AND VERIFIED WITH: Cleda Daub RN 11:10 02/15/14 ([email or web address])   Basic  metabolic panel     Status: Abnormal   Collection Time: 02/15/14 10:23 AM  Result Value Ref Range   Sodium 143 135 - 145 mmol/L    Comment: Please note change in reference range.   Potassium 3.3 (L) 3.5 - 5.1 mmol/L    Comment: Please note change in reference range.   Chloride 107 96 - 112 mEq/L   CO2 24 19 - 32 mmol/L   Glucose, Bld 159 (H) 70 - 99 mg/dL   BUN 19 6 - 23 mg/dL   Creatinine, Ser 0.88 0.50 - 1.10 mg/dL   Calcium 8.9 8.4 - 10.5 mg/dL   GFR calc non Af Amer 55 (L) >90 mL/min   GFR calc Af Amer 64 (L) >90 mL/min    Comment: (NOTE) The eGFR has been calculated using the CKD EPI equation. This calculation has not been validated in all clinical situations. eGFR's persistently <90 mL/min signify possible Chronic Kidney Disease.    Anion gap 12 5 - 15  CBC     Status: Abnormal   Collection Time: 02/15/14 10:23 AM  Result Value Ref Range   WBC 17.3 (H) 4.0 - 10.5 K/uL   RBC 3.56 (L) 3.87 - 5.11 MIL/uL   Hemoglobin 10.1 (L) 12.0 - 15.0 g/dL   HCT 33.6 (L) 36.0 - 46.0 %   MCV 94.4 78.0 - 100.0 fL   MCH 28.4 26.0 - 34.0 pg   MCHC 30.1 30.0 - 36.0 g/dL   RDW 14.1 11.5 - 15.5 %   Platelets 302 150 - 400 K/uL    MICRO: 1/12 blood cx 1 of 2 serratia 1/13 urine cx ngtd 1/18 blood cx ngtd 1/19 cdiff POSITIVE  Assessment/Plan: 81yo F initially admitted for sepsis due to urinary source c/b serratia bacteremia has now developed c.difficile colitis  Serratia bacteremia and uti = will discontinue cefepime since she received 7 days of treatment. Can do shorter course of treatment with gram negative bacteremia associated with uti. She had clinically improved until she acquired cdifficile infection  Malnutrition = continue with ensure drinks supplementation, will add hepatic panel  C.difficile infection = given her leukocytosis, age. Will treat her c.difficile with oral vanco 150m QID x 14 days and probiotics.  CElzie RingsSPapaik[facility]for Infectious  Diseases [phone]

## 2014-02-15 NOTE — Progress Notes (Signed)
[facility] PROGRESS NOTE  [name] ZOX:[id number] DOB: 1919/02/07 DOA: 02/07/2014   PCP: Illene RegulusMichael [name], MD   Brief history of present illness;   Patient is a 81 year old female with a hypothyroid, hypertension, GERD, dementia, COPD presented from the skilled nursing facility with syncopal episode. Per EMS, patient passed out at about 11:30 PM when she was sitting on the commode for about 5 minutes.  Workup in the emergency department included a chest x-ray which was interpreted by radiology to have developing cardiomegaly, basilar congestion possible pulmonary edema. Patient was administered IV Lasix. She did not appear volume overloaded however and BNP came back at 73.7. UA positive for UTI, patient was started on IV Rocephin. She also appeared to be dehydrated rather than volume overloaded, was given 500 mL IV bolus, spiking temp of 102.5 and 103.6.    Assessment/Plan: Serratia marcescens Sepsis likely due to UTI-  present on admission, evidenced by temperature of 102.5, HR 103, RR 28, source likely UTI  - Urine culture showed no growth however urine culture was not done prior to antibiotics at the time of admission. - I have consulted infectious disease, Dr Ilsa IhaSnyder, given the new findings of C. difficile colitis today  - Swallow evaluation done, also has dysphagia, possibility of aspiration, on dysphagia 1 diet  C. Difficile diarrhea - Patient had diarrhea last night, stool studies back this morning with C. difficile positive - Placed on metronidazole for now, called ID consult, Dr Ilsa IhaSnyder. Patient also on IV cefepime for Serratia bacteremia - cont contact precautions  Questionable Fluid overload:  -" Initial chest x-ray reported pulmonary vascular congestion, BNP 73.7. 2-D echo showed EF of 60-65%, grade 1 diastolic dysfunction. Daughter reports that there is NO history of CHF and upset about the "misdiagnosis". Patient had received lasix dose at the time of admission and  family is upset that patient "went down" and became "dehydrated" after that. She feels patient 'improved' after IV fluids".  - Patient's daughter wants IVF to be continued as patient is not eating enough, I agree with it as patient is not eating too well. Nutrition consult also placed        Urinary tract infection. -Patient having a syncopal event at her nursing home, presenting with sepsis -Continue IV cefepime, urine culture showed no significant growth, only 6000 colonies   Syncope -Likely secondary to underlying infectious process, dehydration, sepsis   Hypertension.  -BP currently stable, monitor closely  Acute encephalopathy superimposed on dementia: Currently in memory care unit - Likely worsened due to #1, UTI  Nutrition : - Not eating enough, placed a nutrition consult   Code Status: DO NOT RESUSCITATE  Family Communication: discussed with the daughter in detail on phone and d/w grandson, [name] at bed side.  Disposition Plan:  SNF  Antibiotics:  Cefepime 1/12  Flagyl 1/19 >  Subjective:  Patient seen and examined, sitting up in chair, alert and awake, diarrhea overnight    Objective:  BP 135/56 mmHg  Pulse 78  Temp(Src) 97.6 F (36.4 C) (Oral)  Resp 18  Ht 5' (1.524 m)  Wt 54.6 kg (120 lb 5.9 oz)  BMI 23.51 kg/m2  SpO2 98%   Intake/Output Summary (Last 24 hours) at 02/15/14 1123 Last data filed at 02/14/14 1700  Gross per 24 hour  Intake    240 ml  Output      0 ml  Net    240 ml   Filed Weights   02/11/14 0500 02/12/14 0437 02/13/14 0700  Weight: 55.9 kg (123 lb 3.8 oz) 54.9 kg (121 lb 0.5 oz) 54.6 kg (120 lb 5.9 oz)    Exam:   General: alert and awake, pleasant, oriented to self   Cardiovascular: Regular rate and rhythm normal S1-S2  Respiratory: CTAB  Abdomen: Soft nontender nondistended  Ext: no c/c/e  Data Reviewed: Basic Metabolic Panel:  Recent Labs Lab 02/09/14 0230 02/10/14 0236 02/10/14 1725 02/11/14 0426  02/12/14 0310 02/15/14 1023  NA 139 145  --  144 145 143  K 3.7 2.8* 4.6 3.9 3.7 3.3*  CL 104 117*  --  109 112 107  CO2 23 22  --  GLUCOSE 151* 126*  --  134* 120* 159*  BUN 27* 25*  --  19 28* 19  CREATININE 1.25* 0.87  --  0.80 0.88 0.88  CALCIUM 8.6 7.9*  --  9.5 9.4 8.9   Liver Function Tests: No results for input(s): AST, ALT, ALKPHOS, BILITOT, PROT, ALBUMIN in the last 168 hours. No results for input(s): LIPASE, AMYLASE in the last 168 hours. No results for input(s): AMMONIA in the last 168 hours. CBC:  Recent Labs Lab 02/09/14 0230 02/10/14 0236 02/11/14 0426 02/12/14 0310 02/15/14 1023  WBC 18.1* 15.0* 14.1* 10.2 17.3*  HGB 11.2* 9.7* 11.5* 9.9* 10.1*  HCT 35.5* 31.1* 37.0 32.2* 33.6*  MCV 93.4 93.1 93.0 93.1 94.4  PLT 284 267 315 304 302   Cardiac Enzymes:  Recent Labs Lab 02/08/14 1710  TROPONINI <0.03   BNP (last 3 results) No results for input(s): PROBNP in the last 8760 hours. CBG:  Recent Labs Lab 02/09/14 0902 02/10/14 0936 02/12/14 0818  GLUCAP 121* 109* 107*    Recent Results (from the past 240 hour(s))  MRSA PCR Screening     Status: None   Collection Time: 02/08/14  6:38 AM  Result Value Ref Range Status   MRSA by PCR NEGATIVE NEGATIVE Final    Comment:        The GeneXpert MRSA Assay (FDA approved for NASAL specimens only), is one component of a comprehensive MRSA colonization surveillance program. It is not intended to diagnose MRSA infection nor to guide or monitor treatment for MRSA infections.   Culture, blood (routine x 2)     Status: None   Collection Time: 02/08/14  5:00 PM  Result Value Ref Range Status   Specimen Description BLOOD RIGHT ARM  Final   Special Requests BOTTLES DRAWN AEROBIC AND ANAEROBIC 10CC  Final   Culture   Final    SERRATIA MARCESCENS Note: Gram Stain Report Called to,Read Back By and Verified With: TATA CARBONE 02/09/14 0945 BY SMITHERSJ Performed at [facility]    Report  Status 02/11/2014 FINAL  Final   Organism ID, Bacteria SERRATIA MARCESCENS  Final      Susceptibility   Serratia marcescens - MIC*    CEFAZOLIN >=64 RESISTANT Resistant     CEFEPIME <=1 SENSITIVE Sensitive     CEFTAZIDIME <=1 SENSITIVE Sensitive     CEFTRIAXONE <=1 SENSITIVE Sensitive     CIPROFLOXACIN <=0.25 SENSITIVE Sensitive     GENTAMICIN <=1 SENSITIVE Sensitive     TOBRAMYCIN 8 INTERMEDIATE Intermediate     TRIMETH/SULFA <=20 SENSITIVE Sensitive     * SERRATIA MARCESCENS  Culture, blood (routine x 2)     Status: None   Collection Time: 02/08/14  5:10 PM  Result Value Ref Range Status   Specimen Description BLOOD RIGHT ARM  Final  Special Requests BOTTLES DRAWN AEROBIC ONLY 10CC  Final   Culture   Final    NO GROWTH 5 DAYS Performed at [facility]    Report Status 02/15/2014 FINAL  Final  Urine culture     Status: None   Collection Time: 02/09/14 10:37 AM  Result Value Ref Range Status   Specimen Description URINE, CATHETERIZED  Final   Special Requests NONE  Final   Colony Count   Final    6,000 COLONIES/ML Performed at [facility]    Culture   Final    INSIGNIFICANT GROWTH Performed at [facility]    Report Status 02/11/2014 FINAL  Final  Clostridium Difficile by PCR     Status: Abnormal   Collection Time: 02/15/14  6:11 AM  Result Value Ref Range Status   C difficile by pcr POSITIVE (A) NEGATIVE Final    Comment: CRITICAL RESULT CALLED TO, READ BACK BY AND VERIFIED WITH: Philipp Ovens RN 11:10 02/15/14 ([email or web address])      Studies: No results found.  Scheduled Meds: . ceFEPime (MAXIPIME) IV  1 g Intravenous Q24H  . diclofenac sodium  2 g Topical BID  . docusate  200 mg Oral BID  . enalapril  5 mg Oral Daily  . feeding supplement (ENSURE)  1 Container Oral TID BM  . ferrous sulfate  325 mg Oral BID WC  . heparin  5,000 Units Subcutaneous 3 times per day  . levothyroxine  75 mcg Oral QAC breakfast  . metoprolol tartrate  50 mg Oral BID   . metroNIDAZOLE  500 mg Oral 3 times per day  . pantoprazole  40 mg Oral Daily  . sodium chloride  3 mL Intravenous Q12H  . triamcinolone cream  1 application Topical BID   Continuous Infusions: . dextrose 5 % and 0.45% NaCl 75 mL/hr at 02/15/14 0514   Time spent: 25 mins  Vincenzo Stave M.D. [facility] 02/15/2014, 11:23 AM  Pager: [phone]

## 2014-02-15 NOTE — Progress Notes (Signed)
Physical Therapy Treatment Patient Details Name: [name] MRN: [id number] Roberson DOB: Oct 09, 1919 Today's Date: 02/15/2014    History of Present Illness 81 y.o. female with past medical history of hypothyroidism, hypertension, GERD, dementia, COPD, hx of GIB, who presents with syncope.  UTI, sepsis    PT Comments    Pt more alert and responsive today answering some questions. Deanna HoffGrandson [name] present and assisting with cues for [place] throughout. Pt able to attempt stepping with RW but continues to require 2 person assist and unable to advance beyond a couple steps due to crouched posture, weakness and impaired balance.    Follow Up Recommendations  SNF;Supervision/Assistance - 24 hour     Equipment Recommendations       Recommendations for Other Services       Precautions / Restrictions Precautions Precautions: Fall Restrictions Weight Bearing Restrictions: No    Mobility  Bed Mobility Overal bed mobility: Needs Assistance Bed Mobility: Rolling;Sidelying to Sit Rolling: Mod assist Sidelying to sit: Mod assist       General bed mobility comments: cues for sequence with assist to shift weight, bring legs off of bed and elevate trunk  Transfers Overall transfer level: Needs assistance   Transfers: Sit to/from Stand;Squat Pivot Transfers Sit to Stand: Max assist;From elevated surface;+2 physical assistance;+2 safety/equipment Stand pivot transfers: Max assist;+2 physical assistance;+2 safety/equipment       General transfer comment: sit to stand from bed x 3 with pt attepmting to step forward but incontinent of urine. Maintaing flexed posture with crouched gait with use of RW. Pivoted to recliner with 2 person assist but following commands for hand placement on RW  Ambulation/Gait Ambulation/Gait assistance: Max assist;+2 physical assistance Ambulation Distance (Feet): 1 Feet Assistive device: Rolling walker (2 wheeled) Gait Pattern/deviations: Trunk  flexed;Shuffle   Gait velocity interpretation: Below normal speed for age/gender General Gait Details: anterior lean, flexed hips/knees/trunk max cues for safety and sequence not advancing RLE and chair pulled under pt   Stairs            Wheelchair Mobility    Modified Rankin (Stroke Patients Only)       Balance Overall balance assessment: Needs assistance   Sitting balance-Leahy Scale: Fair       Standing balance-Leahy Scale: Zero                      Cognition Arousal/Alertness: Awake/alert Behavior During Therapy: Flat affect Overall Cognitive Status: History of cognitive impairments - at baseline       Memory: Decreased short-term memory;Decreased recall of precautions              Exercises      General Comments        Pertinent Vitals/Pain Pain Assessment: No/denies pain    Home Living                      Prior Function            PT Goals (current goals can now be found in the care plan section) Progress towards PT goals: Progressing toward goals (slowly)    Frequency       PT Plan Current plan remains appropriate    Co-evaluation             End of Session Equipment Utilized During Treatment: Gait belt Activity Tolerance: Patient tolerated treatment well Patient left: in chair;with call bell/phone within reach;with nursing/sitter in room;with family/visitor present     Time:  1610-9604 PT Time Calculation (min) (ACUTE ONLY): 28 min  Charges:  $Therapeutic Activity: 23-37 mins                    G Codes:      Delorse Lek 03/04/14, 10:52 AM Delaney Meigs, PT [phone]

## 2014-02-15 NOTE — Progress Notes (Signed)
02/15/2014 3:02 PM Pt. With c/o SOB.  Vital Signs 72-187/71 SPO2 99-100% on RA.  LS clear, diminished in bases.  Placed pt. Back in bed.  Once back in bed pt. Was relaxed and not with c/o SOB. Kathryne HitchAllen, Addelyn Alleman C

## 2014-02-16 LAB — BASIC METABOLIC PANEL
Anion gap: 8 (ref 5–15)
BUN: 18 mg/dL (ref 6–23)
CO2: 23 mmol/L (ref 19–32)
Calcium: 8.5 mg/dL (ref 8.4–10.5)
Chloride: 110 mEq/L (ref 96–112)
Creatinine, Ser: 0.72 mg/dL (ref 0.50–1.10)
GFR calc Af Amer: 84 mL/min — ABNORMAL LOW (ref >90)
GFR, EST NON AFRICAN AMERICAN: 72 mL/min — AB (ref >90)
GLUCOSE: 123 mg/dL — AB (ref 70–99)
POTASSIUM: 3.8 mmol/L (ref 3.5–5.1)
SODIUM: 141 mmol/L (ref 135–145)

## 2014-02-16 LAB — CBC
HEMATOCRIT: 28.8 % — AB (ref 36.0–46.0)
Hemoglobin: 9.1 g/dL — ABNORMAL LOW (ref 12.0–15.0)
MCH: 29.5 pg (ref 26.0–34.0)
MCHC: 31.6 g/dL (ref 30.0–36.0)
MCV: 93.5 fL (ref 78.0–100.0)
PLATELETS: 286 10*3/uL (ref 150–400)
RBC: 3.08 MIL/uL — ABNORMAL LOW (ref 3.87–5.11)
RDW: 14.4 % (ref 11.5–15.5)
WBC: 14.7 10*3/uL — AB (ref 4.0–10.5)

## 2014-02-16 LAB — GLUCOSE, CAPILLARY: GLUCOSE-CAPILLARY: 99 mg/dL (ref 70–99)

## 2014-02-16 NOTE — Progress Notes (Signed)
[facility] PROGRESS NOTE  [name] RUE:[id number] DOB: 1919-04-13 DOA: 02/07/2014   PCP: Illene RegulusMichael [name], MD   Brief history of present illness;   Patient is a 81 year old female with a hypothyroid, hypertension, GERD, dementia, COPD presented from the skilled nursing facility with syncopal episode. Per EMS, patient passed out at about 11:30 PM when she was sitting on the commode for about 5 minutes.  Workup in the emergency department included a chest x-ray which was interpreted by radiology to have developing cardiomegaly, basilar congestion possible pulmonary edema. Patient was administered IV Lasix. She did not appear volume overloaded however and BNP came back at 73.7. UA positive for UTI, patient was started on IV Rocephin. Her blood cultures came positive for serratia marcescens, one bottle out of two, and she was treated with IV cefepime. She started having diarrhea and tested positive for c diff. Cefepime was stopped and shewas started on oral vancomycin on 1/19.    Assessment/Plan: Serratia marcescens Sepsis likely due to UTI-  present on admission, evidenced by temperature of 102.5, HR 103, RR 28, source unclear as  urine cultures show insignificant growth. -however urine culture was not done prior to antibiotics at the time of admission. - I have consulted infectious disease, Dr Ilsa IhaSnyder, given the new findings of C. difficile colitis today  - Swallow evaluation done, also has dysphagia, , on dysphagia 1 diet  C. Difficile diarrhea - Patient had diarrhea last night, stool studies back  with C. difficile positive - on oral vancomycin.   Questionable Fluid overload:  -" Initial chest x-ray reported pulmonary vascular congestion, BNP 73.7. 2-D echo showed EF of 60-65%, grade 1 diastolic dysfunction. Daughter reports that there is NO history of CHF and upset about the "misdiagnosis". Patient had received lasix dose at the time of admission and family is upset that patient  "went down" and became "dehydrated" after that. She feels patient 'improved' after IV fluids".  Currently on gentle hydration.    Urinary tract infection. -Patient having a syncopal event at her nursing home, presenting with sepsis.  Completed the antibiotic course.    Syncope -Likely secondary to underlying infectious process, dehydration, sepsis   Hypertension.  -BP currently stable, monitor closely  Acute encephalopathy superimposed on dementia: Currently in memory care unit - Likely worsened due to #1, UTI  Nutrition : - Not eating enough, placed a nutrition consult   Code Status: DO NOT RESUSCITATE  Family Communication: discussed with the daughter in detail on phone   Disposition Plan:  SNF  Antibiotics:  Cefepime 1/12  Flagyl 1/19 >  Subjective:  Patient seen and examined, sitting up in chair, alert and awake, ONE LOOSE BM this am.    Objective:  BP 135/47 mmHg  Pulse 69  Temp(Src) 99.6 F (37.6 C) (Oral)  Resp 18  Ht 5' (1.524 m)  Wt 54.6 kg (120 lb 5.9 oz)  BMI 23.51 kg/m2  SpO2 98%   Intake/Output Summary (Last 24 hours) at 02/16/14 1509 Last data filed at 02/16/14 1324  Gross per 24 hour  Intake    100 ml  Output      0 ml  Net    100 ml   Filed Weights   02/11/14 0500 02/12/14 0437 02/13/14 0700  Weight: 55.9 kg (123 lb 3.8 oz) 54.9 kg (121 lb 0.5 oz) 54.6 kg (120 lb 5.9 oz)    Exam:   General: alert and awake, pleasant, oriented to self   Cardiovascular: Regular rate and rhythm  normal S1-S2  Respiratory: CTAB  Abdomen: Soft nontender nondistended  Ext: no c/c/e  Data Reviewed: Basic Metabolic Panel:  Recent Labs Lab 02/10/14 0236 02/10/14 1725 02/11/14 0426 02/12/14 0310 02/15/14 1023 02/16/14 0400  NA 145  --  144 145 143 141  K 2.8* 4.6 3.9 3.7 3.3* 3.8  CL 117*  --  109 112 107 110  CO2 22  --  GLUCOSE 126*  --  134* 120* 159* 123*  BUN 25*  --  19 28* 19 18  CREATININE 0.87  --  0.80 0.88 0.88  0.72  CALCIUM 7.9*  --  9.5 9.4 8.9 8.5   Liver Function Tests:  Recent Labs Lab 02/15/14 1023  AST 28  ALT 22  ALKPHOS 75  BILITOT 0.4  PROT 5.4*  ALBUMIN 2.6*   No results for input(s): LIPASE, AMYLASE in the last 168 hours. No results for input(s): AMMONIA in the last 168 hours. CBC:  Recent Labs Lab 02/10/14 0236 02/11/14 0426 02/12/14 0310 02/15/14 1023 02/16/14 0400  WBC 15.0* 14.1* 10.2 17.3* 14.7*  HGB 9.7* 11.5* 9.9* 10.1* 9.1*  HCT 31.1* 37.0 32.2* 33.6* 28.8*  MCV 93.1 93.0 93.1 94.4 93.5  PLT 267 315 304 302 286   Cardiac Enzymes: No results for input(s): CKTOTAL, CKMB, CKMBINDEX, TROPONINI in the last 168 hours. BNP (last 3 results) No results for input(s): PROBNP in the last 8760 hours. CBG:  Recent Labs Lab 02/10/14 0936 02/12/14 0818 02/15/14 1638  GLUCAP 109* 107* 100*    Recent Results (from the past 240 hour(s))  MRSA PCR Screening     Status: None   Collection Time: 02/08/14  6:38 AM  Result Value Ref Range Status   MRSA by PCR NEGATIVE NEGATIVE Final    Comment:        The GeneXpert MRSA Assay (FDA approved for NASAL specimens only), is one component of a comprehensive MRSA colonization surveillance program. It is not intended to diagnose MRSA infection nor to guide or monitor treatment for MRSA infections.   Culture, blood (routine x 2)     Status: None   Collection Time: 02/08/14  5:00 PM  Result Value Ref Range Status   Specimen Description BLOOD RIGHT ARM  Final   Special Requests BOTTLES DRAWN AEROBIC AND ANAEROBIC 10CC  Final   Culture   Final    SERRATIA MARCESCENS Note: Gram Stain Report Called to,Read Back By and Verified With: TATA CARBONE 02/09/14 0945 BY SMITHERSJ Performed at [facility]    Report Status 02/11/2014 FINAL  Final   Organism ID, Bacteria SERRATIA MARCESCENS  Final      Susceptibility   Serratia marcescens - MIC*    CEFAZOLIN >=64 RESISTANT Resistant     CEFEPIME <=1 SENSITIVE Sensitive      CEFTAZIDIME <=1 SENSITIVE Sensitive     CEFTRIAXONE <=1 SENSITIVE Sensitive     CIPROFLOXACIN <=0.25 SENSITIVE Sensitive     GENTAMICIN <=1 SENSITIVE Sensitive     TOBRAMYCIN 8 INTERMEDIATE Intermediate     TRIMETH/SULFA <=20 SENSITIVE Sensitive     * SERRATIA MARCESCENS  Culture, blood (routine x 2)     Status: None   Collection Time: 02/08/14  5:10 PM  Result Value Ref Range Status   Specimen Description BLOOD RIGHT ARM  Final   Special Requests BOTTLES DRAWN AEROBIC ONLY 10CC  Final   Culture   Final    NO GROWTH 5 DAYS Performed at [facility]  Report Status 02/15/2014 FINAL  Final  Urine culture     Status: None   Collection Time: 02/09/14 10:37 AM  Result Value Ref Range Status   Specimen Description URINE, CATHETERIZED  Final   Special Requests NONE  Final   Colony Count   Final    6,000 COLONIES/ML Performed at [facility]    Culture   Final    INSIGNIFICANT GROWTH Performed at [facility]    Report Status 02/11/2014 FINAL  Final  Culture, blood (routine x 2)     Status: None (Preliminary result)   Collection Time: 02/14/14  6:18 PM  Result Value Ref Range Status   Specimen Description BLOOD LEFT HAND  Final   Special Requests BOTTLES DRAWN AEROBIC ONLY 3CC  Final   Culture   Final           BLOOD CULTURE RECEIVED NO GROWTH TO DATE CULTURE WILL BE HELD FOR 5 DAYS BEFORE ISSUING A FINAL NEGATIVE REPORT Performed at [facility]    Report Status PENDING  Incomplete  Culture, blood (routine x 2)     Status: None (Preliminary result)   Collection Time: 02/14/14  6:30 PM  Result Value Ref Range Status   Specimen Description BLOOD LEFT ARM  Final   Special Requests BOTTLES DRAWN AEROBIC ONLY 4CC  Final   Culture   Final           BLOOD CULTURE RECEIVED NO GROWTH TO DATE CULTURE WILL BE HELD FOR 5 DAYS BEFORE ISSUING A FINAL NEGATIVE REPORT Performed at [facility]    Report Status PENDING  Incomplete  Clostridium  Difficile by PCR     Status: Abnormal   Collection Time: 02/15/14  6:11 AM  Result Value Ref Range Status   C difficile by pcr POSITIVE (A) NEGATIVE Final    Comment: CRITICAL RESULT CALLED TO, READ BACK BY AND VERIFIED WITH: Philipp Ovens RN 11:10 02/15/14 ([email or web address])      Studies: No results found.  Scheduled Meds: . diclofenac sodium  2 g Topical BID  . docusate  200 mg Oral BID  . enalapril  5 mg Oral Daily  . feeding supplement (ENSURE)  1 Container Oral TID BM  . ferrous sulfate  325 mg Oral BID WC  . heparin  5,000 Units Subcutaneous 3 times per day  . levothyroxine  75 mcg Oral QAC breakfast  . metoprolol tartrate  50 mg Oral BID  . pantoprazole  40 mg Oral Daily  . saccharomyces boulardii  250 mg Oral BID  . sodium chloride  3 mL Intravenous Q12H  . triamcinolone cream  1 application Topical BID  . vancomycin  125 mg Oral 4 times per day   Continuous Infusions: . dextrose 5 % and 0.45% NaCl 75 mL/hr at 02/16/14 0204   Time spent: 25 mins  Chivas Notz M.D. [facility] 02/16/2014, 3:09 PM  Pager: [phone]

## 2014-02-17 LAB — BASIC METABOLIC PANEL
ANION GAP: 8 (ref 5–15)
BUN: 13 mg/dL (ref 6–23)
CHLORIDE: 105 meq/L (ref 96–112)
CO2: 24 mmol/L (ref 19–32)
CREATININE: 0.77 mg/dL (ref 0.50–1.10)
Calcium: 8.2 mg/dL — ABNORMAL LOW (ref 8.4–10.5)
GFR calc non Af Amer: 71 mL/min — ABNORMAL LOW (ref >90)
GFR, EST AFRICAN AMERICAN: 82 mL/min — AB (ref >90)
Glucose, Bld: 127 mg/dL — ABNORMAL HIGH (ref 70–99)
Potassium: 3.6 mmol/L (ref 3.5–5.1)
SODIUM: 137 mmol/L (ref 135–145)

## 2014-02-17 LAB — GLUCOSE, CAPILLARY: Glucose-Capillary: 114 mg/dL — ABNORMAL HIGH (ref 70–99)

## 2014-02-17 NOTE — Clinical Social Work Note (Signed)
Patients daughter has chosen [facility]- facility aware and able to accept patient at time of DC.   Patients daughter will attempt to do paperwork at facility today in case inclement weather prevents her from making it to the facility over the weekend.  CSW will continue to follow.  Merlyn LotJenna Holoman, LCSWA Clinical Social Worker [phone]

## 2014-02-17 NOTE — Progress Notes (Signed)
Utilization review completed.  

## 2014-02-17 NOTE — Progress Notes (Signed)
Medicare Important Message given? YES  (If response is "NO", the following Medicare IM given date fields will be blank)  Date Medicare IM given: 02/17/14 Medicare IM given by:  Timithy Arons  

## 2014-02-17 NOTE — Progress Notes (Signed)
Physical Therapy Treatment Patient Details Name: [name] MRN: [id number] DOB: 1919-04-06 Today's Date: 02/17/2014    History of Present Illness 81 y.o. female with past medical history of hypothyroidism, hypertension, GERD, dementia, COPD, hx of GIB, who presents with syncope.  UTI, sepsis    PT Comments    Continues to progress slowly towards physical therapy goals. Tolerated 4 feet of ambulation with max assist +2 for support, having urinary incontinence during task. Min assist with bed mobility and max cues for sequencing/technique. Tolerated exercises well. Patient will continue to benefit from skilled physical therapy services to further improve independence with functional mobility.   Follow Up Recommendations  SNF;Supervision/Assistance - 24 hour     Equipment Recommendations  None recommended by PT    Recommendations for Other Services       Precautions / Restrictions Precautions Precautions: Fall Restrictions Weight Bearing Restrictions: No    Mobility  Bed Mobility Overal bed mobility: Needs Assistance Bed Mobility: Supine to Sit Rolling: Min assist   Supine to sit: Min assist     General bed mobility comments: Min assist with max VC and facilatory techniques. Cues for technique throughout. Able to pull self up with hand held assist from PT.  Transfers Overall transfer level: Needs assistance Equipment used: Rolling walker (2 wheeled);2 person hand held assist Transfers: Sit to/from Stand Sit to Stand: Max assist;+2 physical assistance         General transfer comment: Max assist +2 hand held assist to stand from lowest bed setting. Pts knees remains flexed. Responds poorly to cues for upright posture. Attempted +1 assist with rolling walker, again max assist and only able to tolerate for several seconds, performed from reclining chair.  Ambulation/Gait Ambulation/Gait assistance: Max assist;+2 physical assistance Ambulation Distance (Feet):  4 Feet Assistive device: 2 person hand held assist Gait Pattern/deviations: Step-through pattern;Decreased stride length;Shuffle;Trunk flexed;Narrow base of support Gait velocity: decreased   General Gait Details: Continues to flex knees/hips, trunk, with max cues for upright posture, responding poorly. Freezes after taking several steps and need max assist to keep stepping forwards. Attempts to sit prematurely. Had urinary incontinence. Poor sequencing.   Stairs            Wheelchair Mobility    Modified Rankin (Stroke Patients Only)       Balance                                    Cognition Arousal/Alertness: Awake/alert Behavior During Therapy: Flat affect Overall Cognitive Status: History of cognitive impairments - at baseline       Memory: Decreased short-term memory;Decreased recall of precautions              Exercises General Exercises - Lower Extremity Ankle Circles/Pumps: AAROM;Both;15 reps;Seated Quad Sets: Strengthening;Both;10 reps;Seated Long Arc Quad: Strengthening;Both;10 reps;Seated;AAROM Heel Slides: AAROM;Both;10 reps;Seated Hip ABduction/ADduction: AAROM;Both;10 reps;Seated Straight Leg Raises: AAROM;Strengthening;Both;10 reps;Seated Hip Flexion/Marching: AAROM;Strengthening;Both;10 reps;Seated    General Comments General comments (skin integrity, edema, etc.): son present, states pt was independent with a rollator prior to admission      Pertinent Vitals/Pain      Home Living                      Prior Function            PT Goals (current goals can now be found in the care plan section) Acute Rehab  PT Goals PT Goal Formulation: With family Time For Goal Achievement: 02/22/14 Potential to Achieve Goals: Fair Progress towards PT goals: Progressing toward goals    Frequency  Min 3X/week    PT Plan Current plan remains appropriate    Co-evaluation             End of Session Equipment Utilized  During Treatment: Gait belt Activity Tolerance: Patient tolerated treatment well Patient left: in chair;with call bell/phone within reach;with family/visitor present     Time: 1610-9604 PT Time Calculation (min) (ACUTE ONLY): 21 min  Charges:  $Therapeutic Exercise: 8-22 mins                    G Codes:      Berton Mount March 08, 2014, 5:27 PM [address] [place], [place] [phone]

## 2014-02-17 NOTE — Progress Notes (Signed)
[facility] PROGRESS NOTE  [name] ZOX:[id number] DOB: [birth year] DOA: 02/07/2014   PCP: Illene Regulus, MD   Brief history of present illness;   Patient is a 81 year old female with a hypothyroid, hypertension, GERD, dementia, COPD presented from the skilled nursing facility with syncopal episode. Per EMS, patient passed out at about 11:30 PM when she was sitting on the commode for about 5 minutes.  Workup in the emergency department included a chest x-ray which was interpreted by radiology to have developing cardiomegaly, basilar congestion possible pulmonary edema. Patient was administered IV Lasix. She did not appear volume overloaded however and BNP came back at 73.7. UA positive for UTI, patient was started on IV Rocephin. Her blood cultures came positive for serratia marcescens, one bottle out of two, and she was treated with IV cefepime. She started having diarrhea and tested positive for c diff. Cefepime was stopped and shewas started on oral vancomycin on 1/19.    Assessment/Plan: Serratia marcescens Sepsis likely due to UTI-  present on admission, evidenced by temperature of 102.5, HR 103, RR 28, source unclear as  urine cultures show insignificant growth. -however urine culture was not done prior to antibiotics at the time of admission. We have consulted infectious disease, Dr Ilsa Iha, given the new findings of C. difficile colitis, recommended to start her on oral vancomycin.- Swallow evaluation done, also has dysphagia, , on dysphagia 1 diet  C. Difficile diarrhea - Patient had diarrhea , stool studies back  with C. difficile positive - on oral vancomycin for 2 weeks to complete the course.   Questionable Fluid overload:  -" Initial chest x-ray reported pulmonary vascular congestion, BNP 73.7. 2-D echo showed EF of 60-65%, grade 1 diastolic dysfunction. Daughter reports that there is NO history of CHF and upset about the "misdiagnosis". Patient had received lasix  dose at the time of admission and family is upset that patient "went down" and became "dehydrated" after that. She feels patient 'improved' after IV fluids".  Currently on gentle hydration, which was stopped on 1/21   Urinary tract infection. -Patient having a syncopal event at her nursing home, presenting with sepsis.  Completed the antibiotic course.    Syncope -Likely secondary to underlying infectious process, dehydration, sepsis   Hypertension.  -BP currently stable, monitor closely  Acute encephalopathy superimposed on dementia: Currently in memory care unit - Likely worsened due to #1, UTI   Nutrition : - Not eating enough, placed a nutrition consult   Code Status: DO NOT RESUSCITATE  Family Communication: discussed with the daughter in detail on phone   Disposition Plan:  SNF  Antibiotics:  Cefepime 1/12  Flagyl 1/19 >  Subjective:  Patient seen and examined, curled up.    Objective:  BP 135/68 mmHg  Pulse 75  Temp(Src) 97.3 F (36.3 C) (Oral)  Resp 18  Ht 5' (1.524 m)  Wt 54.6 kg (120 lb 5.9 oz)  BMI 23.51 kg/m2  SpO2 97%   Intake/Output Summary (Last 24 hours) at 02/17/14 1149 Last data filed at 02/16/14 1800  Gross per 24 hour  Intake    490 ml  Output      0 ml  Net    490 ml   Filed Weights   02/11/14 0500 02/12/14 0437 02/13/14 0700  Weight: 55.9 kg (123 lb 3.8 oz) 54.9 kg (121 lb 0.5 oz) 54.6 kg (120 lb 5.9 oz)    Exam:   General: alert and awake, pleasant, oriented to self   Cardiovascular:  Regular rate and rhythm normal S1-S2  Respiratory: CTAB  Abdomen: Soft nontender nondistended  Ext: no c/c/e  Data Reviewed: Basic Metabolic Panel:  Recent Labs Lab 02/11/14 0426 02/12/14 0310 02/15/14 1023 02/16/14 0400 02/17/14 0500  NA 144 145 143 141 137  K 3.9 3.7 3.3* 3.8 3.6  CL 109 112 107 110 105  CO2 GLUCOSE 134* 120* 159* 123* 127*  BUN 19 28* CREATININE 0.80 0.88 0.88 0.72 0.77  CALCIUM  9.5 9.4 8.9 8.5 8.2*   Liver Function Tests:  Recent Labs Lab 02/15/14 1023  AST 28  ALT 22  ALKPHOS 75  BILITOT 0.4  PROT 5.4*  ALBUMIN 2.6*   No results for input(s): LIPASE, AMYLASE in the last 168 hours. No results for input(s): AMMONIA in the last 168 hours. CBC:  Recent Labs Lab 02/11/14 0426 02/12/14 0310 02/15/14 1023 02/16/14 0400  WBC 14.1* 10.2 17.3* 14.7*  HGB 11.5* 9.9* 10.1* 9.1*  HCT 37.0 32.2* 33.6* 28.8*  MCV 93.0 93.1 94.4 93.5  PLT 315 304 302 286   Cardiac Enzymes: No results for input(s): CKTOTAL, CKMB, CKMBINDEX, TROPONINI in the last 168 hours. BNP (last 3 results) No results for input(s): PROBNP in the last 8760 hours. CBG:  Recent Labs Lab 02/12/14 0818 02/15/14 1638 02/16/14 0837 02/17/14 0610  GLUCAP 107* 100* 99 114*    Recent Results (from the past 240 hour(s))  MRSA PCR Screening     Status: None   Collection Time: 02/08/14  6:38 AM  Result Value Ref Range Status   MRSA by PCR NEGATIVE NEGATIVE Final    Comment:        The GeneXpert MRSA Assay (FDA approved for NASAL specimens only), is one component of a comprehensive MRSA colonization surveillance program. It is not intended to diagnose MRSA infection nor to guide or monitor treatment for MRSA infections.   Culture, blood (routine x 2)     Status: None   Collection Time: 02/08/14  5:00 PM  Result Value Ref Range Status   Specimen Description BLOOD RIGHT ARM  Final   Special Requests BOTTLES DRAWN AEROBIC AND ANAEROBIC 10CC  Final   Culture   Final    SERRATIA MARCESCENS Note: Gram Stain Report Called to,Read Back By and Verified With: TATA CARBONE 02/09/14 0945 BY SMITHERSJ Performed at [facility]    Report Status 02/11/2014 FINAL  Final   Organism ID, Bacteria SERRATIA MARCESCENS  Final      Susceptibility   Serratia marcescens - MIC*    CEFAZOLIN >=64 RESISTANT Resistant     CEFEPIME <=1 SENSITIVE Sensitive     CEFTAZIDIME <=1 SENSITIVE Sensitive      CEFTRIAXONE <=1 SENSITIVE Sensitive     CIPROFLOXACIN <=0.25 SENSITIVE Sensitive     GENTAMICIN <=1 SENSITIVE Sensitive     TOBRAMYCIN 8 INTERMEDIATE Intermediate     TRIMETH/SULFA <=20 SENSITIVE Sensitive     * SERRATIA MARCESCENS  Culture, blood (routine x 2)     Status: None   Collection Time: 02/08/14  5:10 PM  Result Value Ref Range Status   Specimen Description BLOOD RIGHT ARM  Final   Special Requests BOTTLES DRAWN AEROBIC ONLY 10CC  Final   Culture   Final    NO GROWTH 5 DAYS Performed at [facility]    Report Status 02/15/2014 FINAL  Final  Urine culture     Status: None   Collection Time: 02/09/14 10:37  AM  Result Value Ref Range Status   Specimen Description URINE, CATHETERIZED  Final   Special Requests NONE  Final   Colony Count   Final    6,000 COLONIES/ML [facility]    Culture   Final    INSIGNIFICANT GROWTH Performed at [facility]    Report Status 02/11/2014 FINAL  Final  Culture, blood (routine x 2)     Status: None (Preliminary result)   Collection Time: 02/14/14  6:18 PM  Result Value Ref Range Status   Specimen Description BLOOD LEFT HAND  Final   Special Requests BOTTLES DRAWN AEROBIC ONLY 3CC  Final   Culture   Final           BLOOD CULTURE RECEIVED NO GROWTH TO DATE CULTURE WILL BE HELD FOR 5 DAYS BEFORE ISSUING A FINAL NEGATIVE REPORT [facility]formed at [facility]    Report Status PENDING  Incomplete  Culture, blood (routine x 2)     Status: None (Preliminary result)   Collection Time: 02/14/14  6:30 PM  Result Value Ref Range Status   Specimen Description BLOOD LEFT ARM  Final   Special Requests BOTTLES DRAWN AEROBIC ONLY 4CC  Final   Culture   Final           BLOOD CULTURE RECEIVED NO GROWTH TO DATE CULTURE WILL BE HELD FOR 5 DAYS BEFORE ISSUING A FINAL NEGATIVE REPORT Performed at [facility]    Report Status PENDING  Incomplete  Clostridium Difficile by PCR     Status: Abnormal    Collection Time: 02/15/14  6:11 AM  Result Value Ref Range Status   C difficile by pcr POSITIVE (A) NEGATIVE Final    Comment: CRITICAL RESULT CALLED TO, READ BACK BY AND VERIFIED WITH: Philipp OvensK. ALLEN RN 11:10 02/15/14 ([email or web address])      Studies: No results found.  Scheduled Meds: . diclofenac sodium  2 g Topical BID  . docusate  200 mg Oral BID  . enalapril  5 mg Oral Daily  . feeding supplement (ENSURE)  1 Container Oral TID BM  . ferrous sulfate  325 mg Oral BID WC  . heparin  5,000 Units Subcutaneous 3 times per day  . levothyroxine  75 mcg Oral QAC breakfast  . metoprolol tartrate  50 mg Oral BID  . pantoprazole  40 mg Oral Daily  . saccharomyces boulardii  250 mg Oral BID  . sodium chloride  3 mL Intravenous Q12H  . triamcinolone cream  1 application Topical BID  . vancomycin  125 mg Oral 4 times per day   Continuous Infusions:   Time spent: 25 mins  Aleicia Kenagy M.D. [facility] 02/17/2014, 11:49 AM  Pager: [phone]

## 2014-02-18 LAB — CBC
HEMATOCRIT: 28.1 % — AB (ref 36.0–46.0)
Hemoglobin: 8.8 g/dL — ABNORMAL LOW (ref 12.0–15.0)
MCH: 28.5 pg (ref 26.0–34.0)
MCHC: 31.3 g/dL (ref 30.0–36.0)
MCV: 90.9 fL (ref 78.0–100.0)
Platelets: 261 10*3/uL (ref 150–400)
RBC: 3.09 MIL/uL — ABNORMAL LOW (ref 3.87–5.11)
RDW: 13.8 % (ref 11.5–15.5)
WBC: 9.5 10*3/uL (ref 4.0–10.5)

## 2014-02-18 LAB — BASIC METABOLIC PANEL
Anion gap: 6 (ref 5–15)
BUN: 13 mg/dL (ref 6–23)
CO2: 27 mmol/L (ref 19–32)
CREATININE: 0.69 mg/dL (ref 0.50–1.10)
Calcium: 8.5 mg/dL (ref 8.4–10.5)
Chloride: 107 mEq/L (ref 96–112)
GFR calc Af Amer: 85 mL/min — ABNORMAL LOW (ref >90)
GFR calc non Af Amer: 73 mL/min — ABNORMAL LOW (ref >90)
Glucose, Bld: 99 mg/dL (ref 70–99)
Potassium: 3.6 mmol/L (ref 3.5–5.1)
Sodium: 140 mmol/L (ref 135–145)

## 2014-02-18 LAB — GLUCOSE, CAPILLARY: Glucose-Capillary: 95 mg/dL (ref 70–99)

## 2014-02-18 MED ORDER — RESOURCE THICKENUP CLEAR PO POWD: ORAL | Status: DC

## 2014-02-18 MED ORDER — HYDROCODONE-ACETAMINOPHEN 5-325 MG PO TABS: 1.0000 | ORAL_TABLET | Freq: Four times a day (QID) | ORAL | Status: DC | PRN

## 2014-02-18 MED ORDER — FERROUS SULFATE 325 (65 FE) MG PO TABS: 325.0000 mg | ORAL_TABLET | Freq: Two times a day (BID) | ORAL | Status: AC

## 2014-02-18 MED ORDER — METOPROLOL TARTRATE 50 MG PO TABS: 50.0000 mg | ORAL_TABLET | Freq: Two times a day (BID) | ORAL | Status: DC

## 2014-02-18 MED ORDER — METOPROLOL TARTRATE 50 MG PO TABS: 50.0000 mg | ORAL_TABLET | Freq: Two times a day (BID) | ORAL | Status: AC

## 2014-02-18 MED ORDER — LEVOTHYROXINE SODIUM 75 MCG PO TABS: 75.0000 ug | ORAL_TABLET | Freq: Every day | ORAL | Status: AC

## 2014-02-18 MED ORDER — SACCHAROMYCES BOULARDII 250 MG PO CAPS: 250.0000 mg | ORAL_CAPSULE | Freq: Two times a day (BID) | ORAL | Status: AC

## 2014-02-18 MED ORDER — RESOURCE THICKENUP CLEAR PO POWD: ORAL | Status: AC

## 2014-02-18 MED ORDER — CLOTRIMAZOLE 1 % EX CREA: 1.0000 "application " | TOPICAL_CREAM | Freq: Two times a day (BID) | CUTANEOUS | Status: AC | PRN

## 2014-02-18 MED ORDER — ENSURE PUDDING PO PUDG: 1.0000 | Freq: Three times a day (TID) | ORAL | Status: AC

## 2014-02-18 MED ORDER — ENALAPRIL MALEATE 5 MG PO TABS: 5.0000 mg | ORAL_TABLET | Freq: Every day | ORAL | Status: AC

## 2014-02-18 MED ORDER — ONDANSETRON HCL 4 MG PO TABS: 4.0000 mg | ORAL_TABLET | Freq: Four times a day (QID) | ORAL | Status: AC | PRN

## 2014-02-18 MED ORDER — HYDROCODONE-ACETAMINOPHEN 5-325 MG PO TABS: 1.0000 | ORAL_TABLET | Freq: Four times a day (QID) | ORAL | Status: AC | PRN

## 2014-02-18 MED ORDER — DICLOFENAC SODIUM 1 % TD GEL: 2.0000 g | Freq: Two times a day (BID) | TRANSDERMAL | Status: AC

## 2014-02-18 MED ORDER — OMEPRAZOLE 20 MG PO CPDR: 20.0000 mg | DELAYED_RELEASE_CAPSULE | Freq: Every day | ORAL | Status: AC

## 2014-02-18 MED ORDER — VANCOMYCIN 50 MG/ML ORAL SOLUTION: 125.0000 mg | Freq: Four times a day (QID) | ORAL | Status: AC

## 2014-02-18 MED ORDER — TRIAMCINOLONE ACETONIDE 0.1 % EX CREA: 1.0000 "application " | TOPICAL_CREAM | Freq: Two times a day (BID) | CUTANEOUS | Status: AC

## 2014-02-18 MED ORDER — ACETAMINOPHEN 325 MG PO TABS: 650.0000 mg | ORAL_TABLET | Freq: Four times a day (QID) | ORAL | Status: AC | PRN

## 2014-02-18 MED ORDER — VANCOMYCIN 50 MG/ML ORAL SOLUTION: 125.0000 mg | Freq: Four times a day (QID) | ORAL | Status: DC

## 2014-02-18 NOTE — Discharge Summary (Signed)
Physician Discharge Summary  [name] ZOX:[id number] DOB: Aug 12, 1919 DOA: 02/07/2014  PCP: Illene Regulus, MD  Admit date: 02/07/2014 Discharge date: 02/18/2014  Time spent: 30 minutes  Recommendations for Outpatient Follow-up:  1. Follow p with PCP in one week.  2. Please check CBC In one week.  3. Please feed her at every meals.   Discharge Diagnoses:  Principal Problem:   Syncope Active Problems:   Hypothyroidism   Essential hypertension   COPD (chronic obstructive pulmonary disease)   GASTROESOPHAGEAL REFLUX DISEASE   Dementia of Alzheimer's type with behavioral disturbance   Blood poisoning   Acute cystitis without hematuria   Sepsis   Enteritis due to Clostridium difficile severe malnutrition.  Discharge Condition: improved  Diet recommendation: dysphagia 1 diet  Filed Weights   02/11/14 0500 02/12/14 0437 02/13/14 0700  Weight: 55.9 kg (123 lb 3.8 oz) 54.9 kg (121 lb 0.5 oz) 54.6 kg (120 lb 5.9 oz)    History of present illness:  Patient is a 81 year old female with a hypothyroid, hypertension, GERD, dementia, COPD presented from the skilled nursing facility with syncopal episode. Per EMS, patient passed out at about 11:30 PM when she was sitting on the commode for about 5 minutes.  Workup in the emergency department included a chest x-ray which was interpreted by radiology to have developing cardiomegaly, basilar congestion possible pulmonary edema. Patient was administered IV Lasix. She did not appear volume overloaded however and BNP came back at 73.7. UA positive for UTI, patient was started on IV Rocephin. Her blood cultures came positive for serratia marcescens, one bottle out of two, and she was treated with IV cefepime. She started having diarrhea and tested positive for c diff. Cefepime was stopped and shewas started on oral vancomycin on 1/19. Her diarrhea has improved. And she hadonly one BM . Plan to complete the course of oral vanco for the next 10  days.   Hospital Course:   Serratia marcescens Sepsis likely due to UTI- present on admission, evidenced by temperature of 102.5, HR 103, RR 28, source unclear as urine cultures show insignificant growth. -however urine culture was not done prior to antibiotics at the time of admission. We have consulted infectious disease, Dr Ilsa Iha, given the new findings of C. difficile colitis, recommended to start her on oral vancomycin.- Swallow evaluation done, also has dysphagia, , on dysphagia 1 diet  C. Difficile diarrhea - Patient had diarrhea , stool studies back with C. difficile positive - on oral vancomycin for 2 weeks to complete the course.   Questionable Fluid overload:  -" Initial chest x-ray reported pulmonary vascular congestion, BNP 73.7. 2-D echo showed EF of 60-65%, grade 1 diastolic dysfunction. Daughter reports that there is NO history of CHF and upset about the "misdiagnosis". Patient had received lasix dose at the time of admission and family is upset that patient "went down" and became "dehydrated" after that. She feels patient 'improved' after IV fluids".  Currently on gentle hydration, which was stopped on 1/21. Repeat BMP shows normal electrolytes.   Urinary tract infection. -Patient having a syncopal event at her nursing home, presenting with sepsis.  Completed the antibiotic course.   Syncope -Likely secondary to underlying infectious process, dehydration, sepsis  Hypertension.  -BP currently stable, monitor closely  Acute encephalopathy superimposed on dementia: Currently in memory care unit - Likely worsened due to #1, UTI . Appears to be at baseline.   Severe MalNutrition : - Not eating enough, placed a nutrition consult . Recommend  feeding her at every meals with nutritional supplements as recommended by nutrition.  Procedures:  none  Consultations:  Infectious disease.   Discharge Exam: Filed Vitals:   02/18/14 0432  BP: 157/63  Pulse: 78   Temp: 98.3 F (36.8 C)  Resp: 18    General: alert afebrile and comfortable.  Cardiovascular: s1s2 Respiratory: ctab  Discharge Instructions   Discharge Instructions    Diet - low sodium heart healthy    Complete by:  As directed      Discharge instructions    Complete by:  As directed   Follow up with PCP in one week.          Current Discharge Medication List    START taking these medications   Details  feeding supplement, ENSURE, (ENSURE) PUDG Take 1 Container by mouth 3 (three) times daily between meals. Refills: 0    Maltodextrin-Xanthan Gum (RESOURCE THICKENUP CLEAR) POWD As needed    metoprolol (LOPRESSOR) 50 MG tablet Take 1 tablet (50 mg total) by mouth 2 (two) times daily.    saccharomyces boulardii (FLORASTOR) 250 MG capsule Take 1 capsule (250 mg total) by mouth 2 (two) times daily.    vancomycin (VANCOCIN) 50 mg/mL oral solution Take 2.5 mLs (125 mg total) by mouth every 6 (six) hours. Qty: 100 mL, Refills: 0      CONTINUE these medications which have CHANGED   Details  enalapril (VASOTEC) 5 MG tablet Take 1 tablet (5 mg total) by mouth daily.    HYDROcodone-acetaminophen (NORCO/VICODIN) 5-325 MG per tablet Take 1 tablet by mouth every 6 (six) hours as needed for moderate pain or severe pain. Qty: 10 tablet, Refills: 0      CONTINUE these medications which have NOT CHANGED   Details  acetaminophen (TYLENOL) 325 MG tablet Take 650 mg by mouth every 6 (six) hours as needed for mild pain.     clotrimazole (LOTRIMIN) 1 % cream Apply 1 application topically 2 (two) times daily as needed (to affected skin).     Cranberry 250 MG CAPS Take 500 mg by mouth 2 (two) times daily.    diclofenac sodium (VOLTAREN) 1 % GEL Apply 2 g topically 2 (two) times daily. Applied to right shoulder    ferrous sulfate 325 (65 FE) MG tablet Take 325 mg by mouth 2 (two) times daily with a meal.     levothyroxine (SYNTHROID, LEVOTHROID) 75 MCG tablet Take 75 mcg by mouth  daily before breakfast.    Nutritional Supplements (NUTRITIONAL DRINK PO) Take 1 Container by mouth 2 (two) times daily. House shakes    ondansetron (ZOFRAN) 4 MG tablet Take 4 mg by mouth every 6 (six) hours as needed for nausea or vomiting.    Eyelid Cleansers (OCUSOFT LID SCRUB EX) Apply 1 application topically daily. Use to clean both eyes every day    omeprazole (PRILOSEC) 20 MG capsule Take 20 mg by mouth daily.     triamcinolone cream (KENALOG) 0.1 % Apply 1 application topically 2 (two) times daily. Applied to face, arms, hands and legs.      STOP taking these medications     docusate (COLACE) 50 MG/5ML liquid      OVER THE COUNTER MEDICATION        No Known Allergies Follow-up Information    Follow up with Illene RegulusMichael [name], MD. Schedule an appointment as soon as possible for a visit in 1 week.   Specialty:  Internal Medicine       The results  of significant diagnostics from this hospitalization (including imaging, microbiology, ancillary and laboratory) are listed below for reference.    Significant Diagnostic Studies: Dg Chest 2 View  02/08/2014   CLINICAL DATA:  Mid chest pain.  EXAM: CHEST  2 VIEW  COMPARISON:  08/06/2012  FINDINGS: Mild cardiomegaly. Tortuosity of the thoracic aorta, similar to prior exam. There is increasing vascular congestion and questionable mild pulmonary edema. Small bilateral pleural effusions have developed. There is a large retrocardiac hiatal hernia. No confluent airspace disease to suggest pneumonia. No pneumothorax. No acute osseous abnormalities. Minimal anterior wedging in the mid thoracic spine.  IMPRESSION: Development of cardiomegaly, small bilateral pleural effusions, vascular congestion and possible pulmonary edema. Findings suggest fluid overload/CHF.   Electronically Signed   By: Rubye Oaks M.D.   On: 02/08/2014 00:13   Dg Chest Port 1 View  02/09/2014   CLINICAL DATA:  CHF  EXAM: PORTABLE CHEST - 1 VIEW  COMPARISON:   02/07/2014  FINDINGS: Pleural effusions are not visualized on the AP view. Lungs are clear. Normal heart size.  IMPRESSION: No active disease.   Electronically Signed   By: Maryclare Bean M.D.   On: 02/09/2014 08:16    Microbiology: Recent Results (from the past 240 hour(s))  Culture, blood (routine x 2)     Status: None   Collection Time: 02/08/14  5:00 PM  Result Value Ref Range Status   Specimen Description BLOOD RIGHT ARM  Final   Special Requests BOTTLES DRAWN AEROBIC AND ANAEROBIC 10CC  Final   Culture   Final    SERRATIA MARCESCENS Note: Gram Stain Report Called to,Read Back By and Verified With: TATA CARBONE 02/09/14 0945 BY SMITHERSJ Performed at [facility]    Report Status 02/11/2014 FINAL  Final   Organism ID, Bacteria SERRATIA MARCESCENS  Final      Susceptibility   Serratia marcescens - MIC*    CEFAZOLIN >=64 RESISTANT Resistant     CEFEPIME <=1 SENSITIVE Sensitive     CEFTAZIDIME <=1 SENSITIVE Sensitive     CEFTRIAXONE <=1 SENSITIVE Sensitive     CIPROFLOXACIN <=0.25 SENSITIVE Sensitive     GENTAMICIN <=1 SENSITIVE Sensitive     TOBRAMYCIN 8 INTERMEDIATE Intermediate     TRIMETH/SULFA <=20 SENSITIVE Sensitive     * SERRATIA MARCESCENS  Culture, blood (routine x 2)     Status: None   Collection Time: 02/08/14  5:10 PM  Result Value Ref Range Status   Specimen Description BLOOD RIGHT ARM  Final   Special Requests BOTTLES DRAWN AEROBIC ONLY 10CC  Final   Culture   Final    NO GROWTH 5 DAYS Performed at [facility]    Report Status 02/15/2014 FINAL  Final  Urine culture     Status: None   Collection Time: 02/09/14 10:37 AM  Result Value Ref Range Status   Specimen Description URINE, CATHETERIZED  Final   Special Requests NONE  Final   Colony Count   Final    6,000 COLONIES/ML Performed at [facility]    Culture   Final    INSIGNIFICANT GROWTH Performed at [facility]    Report Status 02/11/2014 FINAL  Final  Culture, blood  (routine x 2)     Status: None (Preliminary result)   Collection Time: 02/14/14  6:18 PM  Result Value Ref Range Status   Specimen Description BLOOD LEFT HAND  Final   Special Requests BOTTLES DRAWN AEROBIC ONLY 3CC  Final   Culture  Final           BLOOD CULTURE RECEIVED NO GROWTH TO DATE CULTURE WILL BE HELD FOR 5 DAYS BEFORE ISSUING A FINAL NEGATIVE REPORT Performed at [facility]    Report Status PENDING  Incomplete  Culture, blood (routine x 2)     Status: None (Preliminary result)   Collection Time: 02/14/14  6:30 PM  Result Value Ref Range Status   Specimen Description BLOOD LEFT ARM  Final   Special Requests BOTTLES DRAWN AEROBIC ONLY 4CC  Final   Culture   Final           BLOOD CULTURE RECEIVED NO GROWTH TO DATE CULTURE WILL BE HELD FOR 5 DAYS BEFORE ISSUING A FINAL NEGATIVE REPORT Performed at [facility]    Report Status PENDING  Incomplete  Clostridium Difficile by PCR     Status: Abnormal   Collection Time: 02/15/14  6:11 AM  Result Value Ref Range Status   C difficile by pcr POSITIVE (A) NEGATIVE Final    Comment: CRITICAL RESULT CALLED TO, READ BACK BY AND VERIFIED WITH: Philipp Ovens RN 11:10 02/15/14 ([email or web address])      Labs: Basic Metabolic Panel:  Recent Labs Lab 02/12/14 0310 02/15/14 1023 02/16/14 0400 02/17/14 0500 02/18/14 0541  NA 145 143 141 137 140  K 3.7 3.3* 3.8 3.6 3.6  CL 112 107 110 105 107  CO2 GLUCOSE 120* 159* 123* 127* 99  BUN 28* CREATININE 0.88 0.88 0.72 0.77 0.69  CALCIUM 9.4 8.9 8.5 8.2* 8.5   Liver Function Tests:  Recent Labs Lab 02/15/14 1023  AST 28  ALT 22  ALKPHOS 75  BILITOT 0.4  PROT 5.4*  ALBUMIN 2.6*   No results for input(s): LIPASE, AMYLASE in the last 168 hours. No results for input(s): AMMONIA in the last 168 hours. CBC:  Recent Labs Lab 02/12/14 0310 02/15/14 1023 02/16/14 0400 02/18/14 1104  WBC 10.2 17.3* 14.7* 9.5  HGB 9.9* 10.1* 9.1* 8.8*  HCT 32.2*  33.6* 28.8* 28.1*  MCV 93.1 94.4 93.5 90.9  PLT 304 302 286 261   Cardiac Enzymes: No results for input(s): CKTOTAL, CKMB, CKMBINDEX, TROPONINI in the last 168 hours. BNP: BNP (last 3 results) No results for input(s): PROBNP in the last 8760 hours. CBG:  Recent Labs Lab 02/12/14 0818 02/15/14 1638 02/16/14 0837 02/17/14 0610 02/18/14 0608  GLUCAP 107* 100* 99 114* 95       Signed:  Paulo Keimig  [facility] 02/18/2014, 1:10 PM

## 2014-02-18 NOTE — Clinical Social Work Note (Signed)
[facility]  Patient and family agreeable to plans will transport via ems RN to call report.  Windell MouldingEric Patrece Tallie, MSW, Theresia MajorsLCSWA [phone]

## 2014-02-18 NOTE — Progress Notes (Signed)
Patient discharged via ems and patient's family at bedside. Report called in to facility. Marin RobertsAisha Joneric Streight RN

## 2014-02-18 NOTE — Clinical Documentation Improvement (Signed)
Nutritional consult on 02/14/14. Please document, if possible,clinical condition.  . Severity: --Mild (first degree) --Moderate (second degree) --Severe (third degree) . Avoid documenting a range of severity, such as "moderate to severe" . Form: --Marasmus --Marasmic kwashiorkor --Other . Document any associated diagnoses/conditions  Supporting Information: 02/14/14   Dementia,poor po intake, eating <10% of meals, GERD, dehydrated,dysphagia,   from SNF Subcutaneous Fat:  Orbital Region: moderate wasting Upper Arm Region: mild to moderate wasting Muscle:  Temple Region: moderate to sever wasting Clavicle Bone Region: severe wasting Clavicle and Acromion Bone Region: moderate to severe wasting Dorsal Hand: moderate wasting Patellar Region: moderate to severe wasting Anterior Thigh Region: moderate wasting Posterior Calf Region: moderate wasting Monitor:  Weight trend, po intake, acceptance of supplements, labs,dysphagia 1 diet   Thank You, Elpidio AnisGarnet Rabiah Goeser RN, BSN, CDI [phone] [place] [facility]

## 2014-02-18 NOTE — Progress Notes (Signed)
    [facility]    Date of Admission:  02/07/2014   Total days of antibiotics 11        Day 3 oral vanco           ID: [name] is a 81 y.o. female with dementia, initially admitted for sepsis due to urinary source/serratia bacteremia complicated by cdifficile colitis Principal Problem:   Syncope Active Problems:   Hypothyroidism   Essential hypertension   COPD (chronic obstructive pulmonary disease)   GASTROESOPHAGEAL REFLUX DISEASE   Dementia of Alzheimer's type with behavioral disturbance   Blood poisoning   Acute cystitis without hematuria   Sepsis   Enteritis due to Clostridium difficile    Subjective: Afebrile, only 1 bm yesterday per report.   Medications:  . diclofenac sodium  2 g Topical BID  . docusate  200 mg Oral BID  . enalapril  5 mg Oral Daily  . feeding supplement (ENSURE)  1 Container Oral TID BM  . ferrous sulfate  325 mg Oral BID WC  . heparin  5,000 Units Subcutaneous 3 times per day  . levothyroxine  75 mcg Oral QAC breakfast  . metoprolol tartrate  50 mg Oral BID  . pantoprazole  40 mg Oral Daily  . saccharomyces boulardii  250 mg Oral BID  . sodium chloride  3 mL Intravenous Q12H  . triamcinolone cream  1 application Topical BID  . vancomycin  125 mg Oral 4 times per day    Objective: Vital signs in last 24 hours: BP 157/63 mmHg  Pulse 78  Temp(Src) 98.3 F (36.8 C) (Axillary)  Resp 18  Ht 5' (1.524 m)  Wt 120 lb 5.9 oz (54.6 kg)  BMI 23.51 kg/m2  SpO2 95% gen = elderly female laying in bed awake, a xo to self only, unchangeed heent = moist mucous membranes abd = soft, +BS Skin =no rash  Lab Results  Recent Labs  02/16/14 0400 02/17/14 0500 02/18/14 0541  WBC 14.7*  --   --   HGB 9.1*  --   --   HCT 28.8*  --   --   NA 141 137 140  K 3.8 3.6 3.6  CL 110 105 107  CO2 23 24 27   BUN 18 13 13   CREATININE 0.72 0.77 0.69   Microbiology: 1/12 blood cx 1 of 2 serratia 1/13 urine cx  ngtd 1/18 blood cx ngtd 1/19 cdiff POSITIVE Studies/Results: No results found.   Assessment/Plan: cdifficile colitis = appears improving from cdiff infection. Currently on day 4/14 of oral vancomycin 125mg  qid. Appears to have responded well to treatment. No need for taper. Please check cbc to ensure leukocytosis is resolved.  Will sign off.  [name], [name] Cell: [phone] Pager: [phone]  02/18/2014, 10:53 AM

## 2014-02-21 LAB — CULTURE, BLOOD (ROUTINE X 2)
CULTURE: NO GROWTH
CULTURE: NO GROWTH

## 2014-02-21 NOTE — Clinical Social Work Note (Signed)
Received discharge summary faxed to [facility] to review and then inform CSW what meds they need to receive if any from the hospital awaiting call back from SNF.  Ervin KnackEric R. Adasyn Mcadams, MSW, [employer] IncLCSWA [phone]

## 2014-03-29 DEATH — deceased

## 2014-07-25 ENCOUNTER — Other Ambulatory Visit: Payer: Self-pay

## 2014-11-17 IMAGING — CR DG FINGER THUMB 2+V*L*
3 series · 3 of 3 positions shown · non-contrast
Comparison: None.

CLINICAL DATA: Post fall, now with laceration to the thumb and
associated bruising

LEFT THUMB 2+V

[x finger pa left]
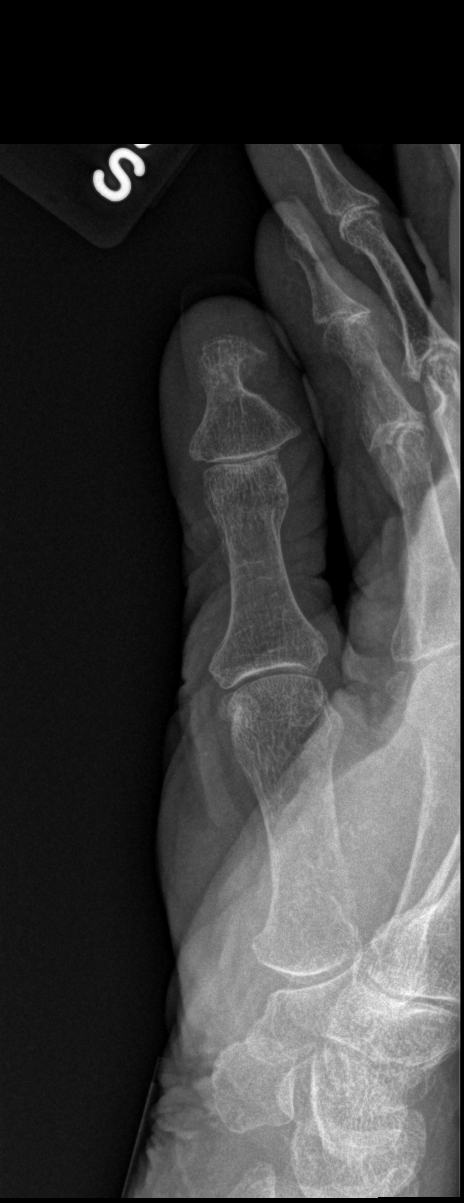

[x finger obl left]
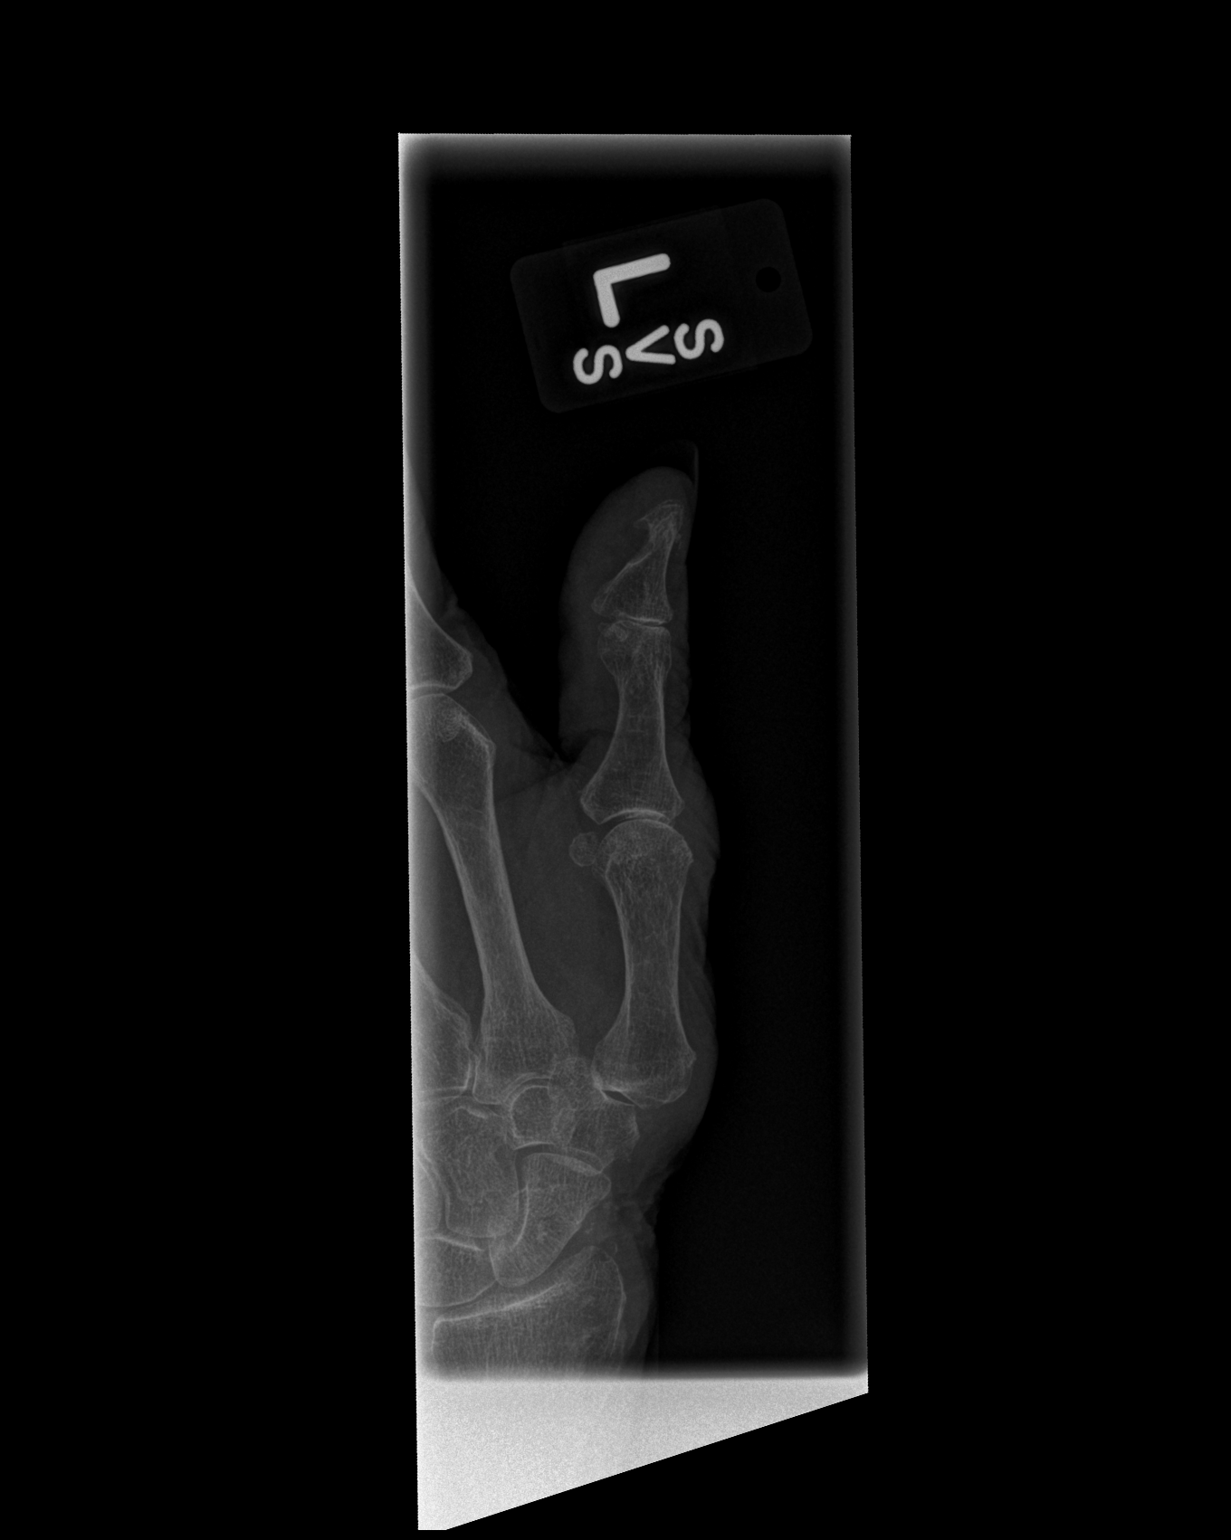

[x finger lat left]
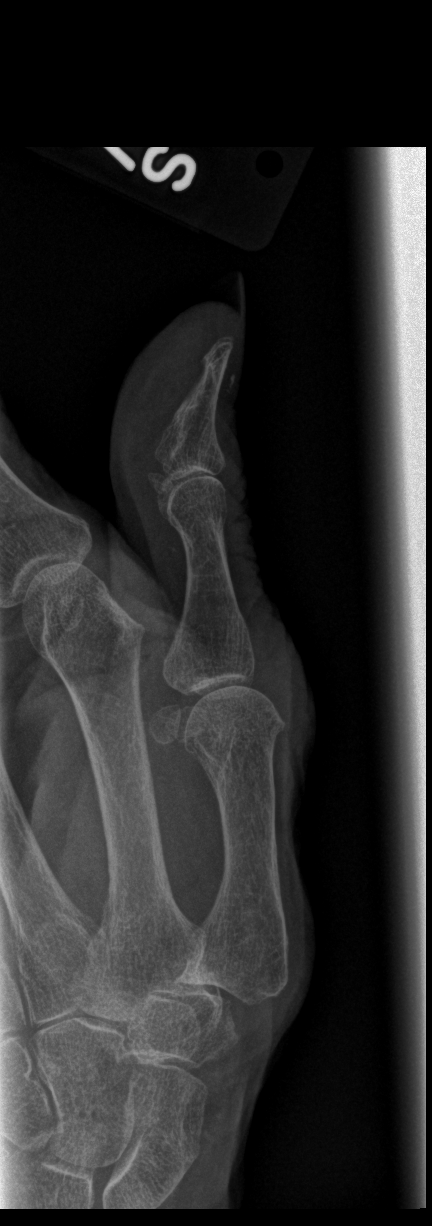

[3 of 3 positions shown; findings below may reference images not displayed]

FINDINGS: Osteopenia.  No displaced fracture.  No dislocation.
Joint spaces are grossly preserved.  No definite erosions.

There is an indeterminate linear approximately 2 mm opacity within
the dorsal soft tissues adjacent to the nail bed best on the
lateral radiograph. Regional soft tissues otherwise normal.

There is a peripherally sclerotic approximately 0.6 x 0.5 cm
lucency involving the ulnar-palmar aspect of the cortex tuft of the
thumb. This finding without associated soft tissue mass.
IMPRESSION: 1.  Osteopenia without definite fracture.

2. Indeterminate linear approximately 2 mm opacity overlying the
soft tissues regional to the nail bed of the thumb.  Correlation
for point tenderness at this location is recommended.

3. Incidental note made of an indeterminate but benign appearing
lucent lesion involving the cortex of the ulnar/palmar aspect of
the thumb, likely representing an enchondroma versus an
intraosseous epidermoid cyst.
